# Patient Record
Sex: Male | Born: 1950 | Race: White | Hispanic: No | Marital: Single | Smoking: Former smoker
Health system: Southern US, Community
[De-identification: ages and names within clinical notes are randomized; demographics above are authoritative.]

## PROBLEM LIST (undated history)

## (undated) DIAGNOSIS — N319 Neuromuscular dysfunction of bladder, unspecified: Secondary | ICD-10-CM

## (undated) DIAGNOSIS — R42 Dizziness and giddiness: Secondary | ICD-10-CM

## (undated) DIAGNOSIS — N200 Calculus of kidney: Secondary | ICD-10-CM

## (undated) DIAGNOSIS — C801 Malignant (primary) neoplasm, unspecified: Secondary | ICD-10-CM

## (undated) DIAGNOSIS — Z5189 Encounter for other specified aftercare: Secondary | ICD-10-CM

## (undated) DIAGNOSIS — I10 Essential (primary) hypertension: Secondary | ICD-10-CM

## (undated) DIAGNOSIS — I2699 Other pulmonary embolism without acute cor pulmonale: Secondary | ICD-10-CM

## (undated) DIAGNOSIS — J449 Chronic obstructive pulmonary disease, unspecified: Secondary | ICD-10-CM

## (undated) HISTORY — PX: LITHOTRIPSY: SUR834

## (undated) HISTORY — PX: DUPUYTREN CONTRACTURE RELEASE: SHX1478

## (undated) HISTORY — PX: LUNG CANCER SURGERY: SHX702

## (undated) HISTORY — PX: FRACTURE SURGERY: SHX138

## (undated) HISTORY — PX: OTHER SURGICAL HISTORY: SHX169

---

## 2013-04-01 DIAGNOSIS — R339 Retention of urine, unspecified: Secondary | ICD-10-CM | POA: Diagnosis present

## 2015-02-11 DIAGNOSIS — I10 Essential (primary) hypertension: Secondary | ICD-10-CM | POA: Diagnosis present

## 2015-02-11 HISTORY — DX: Essential (primary) hypertension: I10

## 2015-04-10 DIAGNOSIS — J449 Chronic obstructive pulmonary disease, unspecified: Secondary | ICD-10-CM | POA: Diagnosis present

## 2015-05-10 DIAGNOSIS — T83511A Infection and inflammatory reaction due to indwelling urethral catheter, initial encounter: Secondary | ICD-10-CM

## 2015-05-10 DIAGNOSIS — N390 Urinary tract infection, site not specified: Secondary | ICD-10-CM | POA: Diagnosis present

## 2017-08-04 DIAGNOSIS — E876 Hypokalemia: Secondary | ICD-10-CM | POA: Diagnosis present

## 2018-10-11 DIAGNOSIS — I2699 Other pulmonary embolism without acute cor pulmonale: Secondary | ICD-10-CM | POA: Diagnosis present

## 2018-10-11 DIAGNOSIS — G8929 Other chronic pain: Secondary | ICD-10-CM | POA: Diagnosis present

## 2018-11-29 ENCOUNTER — Emergency Department (HOSPITAL_COMMUNITY): Payer: Medicare Other

## 2018-11-29 ENCOUNTER — Encounter (HOSPITAL_COMMUNITY): Payer: Self-pay

## 2018-11-29 ENCOUNTER — Other Ambulatory Visit: Payer: Self-pay

## 2018-11-29 ENCOUNTER — Observation Stay (HOSPITAL_COMMUNITY)
Admission: EM | Admit: 2018-11-29 | Discharge: 2018-11-30 | Disposition: A | Discharge status: Home / Self Care | Payer: Medicare Other | Attending: Family Medicine | Admitting: Family Medicine

## 2018-11-29 DIAGNOSIS — R339 Retention of urine, unspecified: Secondary | ICD-10-CM | POA: Diagnosis not present

## 2018-11-29 DIAGNOSIS — E876 Hypokalemia: Secondary | ICD-10-CM | POA: Insufficient documentation

## 2018-11-29 DIAGNOSIS — J449 Chronic obstructive pulmonary disease, unspecified: Secondary | ICD-10-CM | POA: Diagnosis present

## 2018-11-29 DIAGNOSIS — I10 Essential (primary) hypertension: Secondary | ICD-10-CM | POA: Insufficient documentation

## 2018-11-29 DIAGNOSIS — A419 Sepsis, unspecified organism: Secondary | ICD-10-CM | POA: Diagnosis not present

## 2018-11-29 DIAGNOSIS — N3001 Acute cystitis with hematuria: Secondary | ICD-10-CM

## 2018-11-29 DIAGNOSIS — Z7901 Long term (current) use of anticoagulants: Secondary | ICD-10-CM | POA: Insufficient documentation

## 2018-11-29 DIAGNOSIS — I2699 Other pulmonary embolism without acute cor pulmonale: Secondary | ICD-10-CM | POA: Diagnosis not present

## 2018-11-29 DIAGNOSIS — R109 Unspecified abdominal pain: Secondary | ICD-10-CM | POA: Diagnosis present

## 2018-11-29 DIAGNOSIS — Z79899 Other long term (current) drug therapy: Secondary | ICD-10-CM | POA: Diagnosis not present

## 2018-11-29 DIAGNOSIS — G8929 Other chronic pain: Secondary | ICD-10-CM | POA: Diagnosis not present

## 2018-11-29 DIAGNOSIS — T83511A Infection and inflammatory reaction due to indwelling urethral catheter, initial encounter: Secondary | ICD-10-CM

## 2018-11-29 DIAGNOSIS — J189 Pneumonia, unspecified organism: Secondary | ICD-10-CM

## 2018-11-29 DIAGNOSIS — R0902 Hypoxemia: Secondary | ICD-10-CM

## 2018-11-29 DIAGNOSIS — R509 Fever, unspecified: Secondary | ICD-10-CM | POA: Diagnosis present

## 2018-11-29 DIAGNOSIS — N390 Urinary tract infection, site not specified: Secondary | ICD-10-CM | POA: Diagnosis not present

## 2018-11-29 DIAGNOSIS — R112 Nausea with vomiting, unspecified: Secondary | ICD-10-CM | POA: Diagnosis present

## 2018-11-29 HISTORY — DX: Encounter for other specified aftercare: Z51.89

## 2018-11-29 HISTORY — DX: Other pulmonary embolism without acute cor pulmonale: I26.99

## 2018-11-29 HISTORY — DX: Chronic obstructive pulmonary disease, unspecified: J44.9

## 2018-11-29 HISTORY — DX: Dizziness and giddiness: R42

## 2018-11-29 HISTORY — DX: Neuromuscular dysfunction of bladder, unspecified: N31.9

## 2018-11-29 HISTORY — DX: Calculus of kidney: N20.0

## 2018-11-29 HISTORY — DX: Essential (primary) hypertension: I10

## 2018-11-29 HISTORY — DX: Malignant (primary) neoplasm, unspecified: C80.1

## 2018-11-29 LAB — INFLUENZA PANEL BY PCR (TYPE A & B)
INFLAPCR: NEGATIVE
Influenza B By PCR: NEGATIVE

## 2018-11-29 LAB — URINALYSIS, ROUTINE W REFLEX MICROSCOPIC
Bilirubin Urine: NEGATIVE
Glucose, UA: NEGATIVE mg/dL
KETONES UR: NEGATIVE mg/dL
Nitrite: POSITIVE — AB
Protein, ur: NEGATIVE mg/dL
Specific Gravity, Urine: 1.02 (ref 1.005–1.030)
pH: 5 (ref 5.0–8.0)

## 2018-11-29 LAB — CBC WITH DIFFERENTIAL/PLATELET
Abs Immature Granulocytes: 0.14 10*3/uL — ABNORMAL HIGH (ref 0.00–0.07)
Basophils Absolute: 0 10*3/uL (ref 0.0–0.1)
Basophils Relative: 0 %
Eosinophils Absolute: 0.1 10*3/uL (ref 0.0–0.5)
Eosinophils Relative: 0 %
HEMATOCRIT: 42.1 % (ref 39.0–52.0)
Hemoglobin: 14 g/dL (ref 13.0–17.0)
Immature Granulocytes: 1 %
Lymphocytes Relative: 6 %
Lymphs Abs: 1 10*3/uL (ref 0.7–4.0)
MCH: 30 pg (ref 26.0–34.0)
MCHC: 33.3 g/dL (ref 30.0–36.0)
MCV: 90.1 fL (ref 80.0–100.0)
MONOS PCT: 5 %
Monocytes Absolute: 0.9 10*3/uL (ref 0.1–1.0)
Neutro Abs: 15.1 10*3/uL — ABNORMAL HIGH (ref 1.7–7.7)
Neutrophils Relative %: 88 %
Platelets: 289 10*3/uL (ref 150–400)
RBC: 4.67 MIL/uL (ref 4.22–5.81)
RDW: 13 % (ref 11.5–15.5)
WBC: 17.2 10*3/uL — ABNORMAL HIGH (ref 4.0–10.5)
nRBC: 0 % (ref 0.0–0.2)

## 2018-11-29 LAB — COMPREHENSIVE METABOLIC PANEL
ALT: 21 U/L (ref 0–44)
AST: 17 U/L (ref 15–41)
Albumin: 3.7 g/dL (ref 3.5–5.0)
Alkaline Phosphatase: 98 U/L (ref 38–126)
Anion gap: 14 (ref 5–15)
BUN: 15 mg/dL (ref 8–23)
CHLORIDE: 102 mmol/L (ref 98–111)
CO2: 25 mmol/L (ref 22–32)
Calcium: 9 mg/dL (ref 8.9–10.3)
Creatinine, Ser: 0.87 mg/dL (ref 0.61–1.24)
GFR calc Af Amer: >60 mL/min (ref >60)
GFR calc non Af Amer: >60 mL/min (ref >60)
Glucose, Bld: 143 mg/dL — ABNORMAL HIGH (ref 70–99)
Potassium: 2.5 mmol/L — CL (ref 3.5–5.1)
Sodium: 141 mmol/L (ref 135–145)
Total Bilirubin: 0.7 mg/dL (ref 0.3–1.2)
Total Protein: 7.6 g/dL (ref 6.5–8.1)

## 2018-11-29 LAB — MAGNESIUM: Magnesium: 2 mg/dL (ref 1.7–2.4)

## 2018-11-29 LAB — I-STAT CG4 LACTIC ACID, ED: Lactic Acid, Venous: 1.62 mmol/L (ref 0.5–1.9)

## 2018-11-29 LAB — HIV ANTIBODY (ROUTINE TESTING W REFLEX): HIV Screen 4th Generation wRfx: NONREACTIVE

## 2018-11-29 LAB — LIPASE, BLOOD: LIPASE: 31 U/L (ref 11–51)

## 2018-11-29 MED ORDER — POTASSIUM CHLORIDE 10 MEQ/100ML IV SOLN
10.0000 meq | Freq: Once | INTRAVENOUS | Status: AC
  Administered 2018-11-29: 10 meq via INTRAVENOUS
  Filled 2018-11-29: qty 100

## 2018-11-29 MED ORDER — FENTANYL CITRATE (PF) 100 MCG/2ML IJ SOLN
50.0000 ug | Freq: Once | INTRAMUSCULAR | Status: AC
  Administered 2018-11-29: 50 ug via INTRAVENOUS
  Filled 2018-11-29: qty 2

## 2018-11-29 MED ORDER — POTASSIUM CHLORIDE CRYS ER 20 MEQ PO TBCR
40.0000 meq | EXTENDED_RELEASE_TABLET | Freq: Two times a day (BID) | ORAL | Status: AC
  Administered 2018-11-29 – 2018-11-30 (×3): 40 meq via ORAL
  Filled 2018-11-29 (×3): qty 2

## 2018-11-29 MED ORDER — SENNOSIDES-DOCUSATE SODIUM 8.6-50 MG PO TABS
1.0000 | ORAL_TABLET | Freq: Every day | ORAL | Status: DC
  Administered 2018-11-29 – 2018-11-30 (×2): 1 via ORAL
  Filled 2018-11-29 (×2): qty 1

## 2018-11-29 MED ORDER — MOMETASONE FURO-FORMOTEROL FUM 200-5 MCG/ACT IN AERO
2.0000 | INHALATION_SPRAY | Freq: Two times a day (BID) | RESPIRATORY_TRACT | Status: DC
  Administered 2018-11-29 – 2018-11-30 (×3): 2 via RESPIRATORY_TRACT
  Filled 2018-11-29: qty 8.8

## 2018-11-29 MED ORDER — HYDROMORPHONE HCL 2 MG PO TABS
4.0000 mg | ORAL_TABLET | ORAL | Status: DC | PRN
  Administered 2018-11-29 – 2018-11-30 (×5): 4 mg via ORAL
  Filled 2018-11-29 (×5): qty 2

## 2018-11-29 MED ORDER — SODIUM CHLORIDE 0.9 % IV SOLN
INTRAVENOUS | Status: AC
  Administered 2018-11-29: 11:00:00 via INTRAVENOUS

## 2018-11-29 MED ORDER — METRONIDAZOLE IN NACL 5-0.79 MG/ML-% IV SOLN
500.0000 mg | Freq: Three times a day (TID) | INTRAVENOUS | Status: DC
  Administered 2018-11-29: 500 mg via INTRAVENOUS
  Filled 2018-11-29: qty 100

## 2018-11-29 MED ORDER — MELATONIN 5 MG PO TABS
5.0000 mg | ORAL_TABLET | Freq: Every evening | ORAL | Status: DC | PRN
  Administered 2018-11-29: 5 mg via ORAL
  Filled 2018-11-29 (×2): qty 1

## 2018-11-29 MED ORDER — VANCOMYCIN HCL IN DEXTROSE 1-5 GM/200ML-% IV SOLN: 1000.0000 mg | Freq: Once | INTRAVENOUS | Status: DC

## 2018-11-29 MED ORDER — SODIUM CHLORIDE (PF) 0.9 % IJ SOLN
INTRAMUSCULAR | Status: AC
  Filled 2018-11-29: qty 50

## 2018-11-29 MED ORDER — POTASSIUM CHLORIDE CRYS ER 20 MEQ PO TBCR
40.0000 meq | EXTENDED_RELEASE_TABLET | Freq: Once | ORAL | Status: AC
  Administered 2018-11-29: 40 meq via ORAL
  Filled 2018-11-29: qty 2

## 2018-11-29 MED ORDER — IOPAMIDOL (ISOVUE-300) INJECTION 61%
100.0000 mL | Freq: Once | INTRAVENOUS | Status: AC | PRN
  Administered 2018-11-29: 100 mL via INTRAVENOUS

## 2018-11-29 MED ORDER — POTASSIUM CHLORIDE 10 MEQ/100ML IV SOLN
10.0000 meq | INTRAVENOUS | Status: AC
  Administered 2018-11-29: 10 meq via INTRAVENOUS
  Filled 2018-11-29: qty 100

## 2018-11-29 MED ORDER — VANCOMYCIN HCL 10 G IV SOLR
1500.0000 mg | Freq: Once | INTRAVENOUS | Status: AC
  Administered 2018-11-29: 1500 mg via INTRAVENOUS
  Filled 2018-11-29: qty 1500

## 2018-11-29 MED ORDER — IBUPROFEN 200 MG PO TABS
400.0000 mg | ORAL_TABLET | Freq: Four times a day (QID) | ORAL | Status: DC | PRN
  Administered 2018-11-29: 400 mg via ORAL
  Filled 2018-11-29: qty 2

## 2018-11-29 MED ORDER — ALBUTEROL SULFATE (2.5 MG/3ML) 0.083% IN NEBU: 2.5000 mg | INHALATION_SOLUTION | RESPIRATORY_TRACT | Status: DC | PRN

## 2018-11-29 MED ORDER — ATORVASTATIN CALCIUM 40 MG PO TABS
40.0000 mg | ORAL_TABLET | Freq: Every day | ORAL | Status: DC
  Administered 2018-11-29 – 2018-11-30 (×2): 40 mg via ORAL
  Filled 2018-11-29 (×2): qty 1

## 2018-11-29 MED ORDER — IOPAMIDOL (ISOVUE-300) INJECTION 61%
INTRAVENOUS | Status: AC
  Filled 2018-11-29: qty 100

## 2018-11-29 MED ORDER — PANTOPRAZOLE SODIUM 40 MG PO TBEC
40.0000 mg | DELAYED_RELEASE_TABLET | Freq: Every day | ORAL | Status: DC
  Administered 2018-11-29 – 2018-11-30 (×2): 40 mg via ORAL
  Filled 2018-11-29 (×2): qty 1

## 2018-11-29 MED ORDER — SODIUM CHLORIDE 0.9 % IV SOLN
2.0000 g | Freq: Once | INTRAVENOUS | Status: AC
  Administered 2018-11-29: 2 g via INTRAVENOUS
  Filled 2018-11-29: qty 2

## 2018-11-29 MED ORDER — PREGABALIN 50 MG PO CAPS
50.0000 mg | ORAL_CAPSULE | Freq: Two times a day (BID) | ORAL | Status: DC
  Administered 2018-11-29 – 2018-11-30 (×3): 50 mg via ORAL
  Filled 2018-11-29 (×3): qty 1

## 2018-11-29 MED ORDER — ONDANSETRON HCL 4 MG PO TABS: 4.0000 mg | ORAL_TABLET | Freq: Four times a day (QID) | ORAL | Status: DC | PRN

## 2018-11-29 MED ORDER — SODIUM CHLORIDE 0.9 % IV BOLUS (SEPSIS)
1000.0000 mL | Freq: Once | INTRAVENOUS | Status: AC
  Administered 2018-11-29: 1000 mL via INTRAVENOUS

## 2018-11-29 MED ORDER — ONDANSETRON HCL 4 MG/2ML IJ SOLN
4.0000 mg | Freq: Once | INTRAMUSCULAR | Status: AC
  Administered 2018-11-29: 4 mg via INTRAVENOUS
  Filled 2018-11-29: qty 2

## 2018-11-29 MED ORDER — APIXABAN 5 MG PO TABS
5.0000 mg | ORAL_TABLET | Freq: Two times a day (BID) | ORAL | Status: DC
  Administered 2018-11-29 – 2018-11-30 (×3): 5 mg via ORAL
  Filled 2018-11-29 (×3): qty 1

## 2018-11-29 MED ORDER — FENTANYL CITRATE (PF) 100 MCG/2ML IJ SOLN
100.0000 ug | Freq: Once | INTRAMUSCULAR | Status: AC
  Administered 2018-11-29: 100 ug via INTRAVENOUS
  Filled 2018-11-29: qty 2

## 2018-11-29 MED ORDER — POLYETHYLENE GLYCOL 3350 17 G PO PACK: 17.0000 g | PACK | Freq: Every day | ORAL | Status: DC | PRN

## 2018-11-29 MED ORDER — ONDANSETRON HCL 4 MG/2ML IJ SOLN: 4.0000 mg | Freq: Four times a day (QID) | INTRAMUSCULAR | Status: DC | PRN

## 2018-11-29 MED ORDER — SODIUM CHLORIDE 0.9 % IV SOLN
1.0000 g | Freq: Three times a day (TID) | INTRAVENOUS | Status: DC
  Administered 2018-11-29 – 2018-11-30 (×4): 1 g via INTRAVENOUS
  Filled 2018-11-29 (×4): qty 1

## 2018-11-29 MED ORDER — IBUPROFEN 800 MG PO TABS
800.0000 mg | ORAL_TABLET | Freq: Once | ORAL | Status: AC
  Administered 2018-11-29: 800 mg via ORAL
  Filled 2018-11-29: qty 1

## 2018-11-29 NOTE — ED Notes (Signed)
Patient declined staff to cath him-patient cathed himself

## 2018-11-29 NOTE — H&P (Addendum)
History and Physical  Patient Name: Kurt Becker     KZL:[id number]    DOB: 1950-12-10    DOA: 11/29/2018 PCP: Patient, No Pcp Per  [facility] (lives in [place])  Chief Complaint: Abdominal pain, urgency, fever, vomiting      HPI: Kurt Becker is a 68 y.o. M with hx neurogenic bladder, intermittent self-caths, hx of ESBL Klebsiella UTI, recent PE Nov 2019, recent lung wedge resection for benigh nodule, HTN, COPD not on home O2, and chronic pain on Dilaudid who presents with fever, abdominal pain, vomiting, and urinary urgency for 2-3 days.  The patient was in his usual state of health until about 2 days ago when he started to feel generally bad.  Then yesterday evening he started to develop abdominal pain, the need to urinate but no urine with cath, then nausea and vomiting, and overnight he could not sleep.  Then this morning he had fever so he came to the ER.  ED course: -Temp 102.61F, heart rate 126, respirations 23, blood pressure 112/86, pulse ox 88% on room air -Na 141, K 2.5, Cr 0.8, WBC 17K, Hgb 14 -lipase normal -Lactate 1.6 -Influenza negative -UA with bacteria and pyuria -CXR showed some linear opacities bilateral lower lobes -CT abdomen showed perinephric stranding, post-operative changes in right lower lobe, no stones -ECG showed sinus tachycardia - He was given 60 mEq of potassium, 2 L IV fluids, as well as vancomycin, cefepime, and Flagyl, and felt clinical improvement -The hospitalist group were asked to evaluate for sepsis     ROS: Review of Systems  Constitutional: Positive for fever and malaise/fatigue.  Respiratory: Positive for wheezing. Negative for cough, sputum production and shortness of breath.   Cardiovascular: Negative for chest pain, palpitations, orthopnea and leg swelling.  Gastrointestinal: Positive for abdominal pain, nausea and vomiting.  Genitourinary: Positive for urgency. Negative for dysuria, frequency and hematuria.    Neurological: Negative for loss of consciousness.  All other systems reviewed and are negative.         Past Medical History:  Diagnosis Date  . COPD (chronic obstructive pulmonary disease) ([place])   . Essential hypertension 02/11/2015  . Kidney stone   . Neurogenic bladder   . Pulmonary embolus [facility])     Past Surgical History:  Procedure Laterality Date  . c-spine fusion    . DUPUYTREN CONTRACTURE RELEASE    . FRACTURE SURGERY    . LITHOTRIPSY    . LUNG CANCER SURGERY      Social History: Patient lives alone in [place].  The patient walks unassisted, he stil drives.  Former heavy smoker, now quit   Allergies  Allergen Reactions  . Hydrocodone     "bad for my liver"  . Percocet [Oxycodone-Acetaminophen]     "makes me whacked out"    Family history: family history includes Dementia in his mother; Pneumonia in his father.  Prior to Admission medications   Medication Sig Start Date End Date Taking? Authorizing Provider  albuterol (PROVENTIL HFA;VENTOLIN HFA) 108 (90 Base) MCG/ACT inhaler Inhale 1-2 puffs into the lungs every 6 (six) hours as needed for wheezing or shortness of breath.   Yes [provider]  apixaban (ELIQUIS) 5 MG TABS tablet Take 5 mg by mouth 2 (two) times daily.   Yes [provider]  Ascorbic Acid (VITAMIN C) 1000 MG tablet Take 1,000 mg by mouth daily.   Yes [provider]  atorvastatin (LIPITOR) 40 MG tablet Take 40 mg by mouth  daily.   Yes [provider]  budesonide-formoterol (SYMBICORT) 160-4.5 MCG/ACT inhaler Inhale 2 puffs into the lungs 2 (two) times daily.   Yes [provider]  hydrochlorothiazide (HYDRODIURIL) 25 MG tablet Take 25 mg by mouth daily.   Yes [provider]  HYDROmorphone (DILAUDID) 4 MG tablet Take 4 mg by mouth every 3 (three) hours as needed for severe pain.   Yes [provider]  meclizine (ANTIVERT) 25 MG tablet Take 25 mg by mouth 3 (three) times daily.    Yes [provider]  Melatonin 5 MG TABS Take 5 mg by mouth at bedtime as needed (sleep).   Yes [provider]  pantoprazole (PROTONIX) 40 MG tablet Take 40 mg by mouth daily.   Yes [provider]  pregabalin (LYRICA) 50 MG capsule Take 50 mg by mouth 2 (two) times daily.   Yes [provider]  sennosides-docusate sodium (SENOKOT-S) 8.6-50 MG tablet Take 1 tablet by mouth daily.   Yes [provider]       Physical Exam: BP 109/80   Pulse 87   Temp (!) 102.8 F (39.3 C) (Rectal)   Resp 20   Ht 5\' 11"  (1.803 m)   Wt 97.2 kg   SpO2 96%   BMI 29.90 kg/m  General appearance: Well-developed, adult male, alert and in no acute distress.   Eyes: Anicteric, conjunctiva pink, lids and lashes normal. PERRL.    ENT: No nasal deformity, discharge, epistaxis.  Hearing normal. OP moist without lesions.  Denstures in place, lips normal. Neck: No neck masses.  Trachea midline.  No thyromegaly/tenderness. Lymph: No cervical or supraclavicular lymphadenopathy. Skin: Warm and dry.  No jaundice.  No suspicious rashes or lesions. Cardiac: RRR, nl S1-S2, no murmurs appreciated.  Capillary refill is brisk.  JVP normal.  No LE edema.  Radial and DP pulses 2+ and symmetric. Respiratory: Somewhat dyspneic, audible wheezing, rhonchi on auscultation, no rales.   Abdomen: Abdomen soft.  Marked RUQ TTP, voluntary guarding noted, no rebound. No ascites, distension.   MSK: No deformities or effusions.  No cyanosis or clubbing.  Missing right 1st digit. Neuro: Cranial nerves normal.  Sensation intact to light touch. Speech is fluent.  Muscle strength normal and symmetric.    Psych: Sensorium intact and responding to questions, attention normal.  Behavior appropriate.  Affect normal.  Judgment and insight appear normal.     Labs on Admission:  I have personally reviewed following labs and imaging studies: CBC: Recent Labs  Lab 11/29/18 0334  WBC 17.2*  NEUTROABS  15.1*  HGB 14.0  HCT 42.1  MCV 90.1  PLT 409   Basic Metabolic Panel: Recent Labs  Lab 11/29/18 0334  NA 141  K 2.5*  CL 102  CO2 25  GLUCOSE 143*  BUN 15  CREATININE 0.87  CALCIUM 9.0   GFR: Estimated Creatinine Clearance: 100.7 mL/min (by C-G formula based on SCr of 0.87 mg/dL).  Liver Function Tests: Recent Labs  Lab 11/29/18 0334  AST 17  ALT 21  ALKPHOS 98  BILITOT 0.7  PROT 7.6  ALBUMIN 3.7   Recent Labs  Lab 11/29/18 0334  LIPASE 31   Sepsis Labs: Lactic acid 1.6           Radiological Exams on Admission: Personally reviewed CXR shows patchy bilateral opacities; CT report reviewed and summarized above: Dg Chest 2 View  Result Date: 11/29/2018 CLINICAL DATA:  Acute onset of cough, fever and shortness of breath. EXAM: CHEST -  2 VIEW COMPARISON:  None. FINDINGS: The lungs are well-aerated. Mild right midlung and medial left basilar airspace opacities raise concern for pneumonia. There is no evidence of pleural effusion or pneumothorax. The heart is normal in size; the mediastinal contour is within normal limits. No acute osseous abnormalities are seen. Cervical spinal fusion hardware is partially imaged. IMPRESSION: Mild right midlung and medial left basilar airspace opacities raise concern for pneumonia. Followup PA and lateral chest X-ray is recommended in 3-4 weeks following trial of antibiotic therapy to ensure resolution and exclude underlying malignancy. Electronically Signed   By: Garald Balding M.D.   On: 11/29/2018 04:17   Ct Abdomen Pelvis W Contrast  Result Date: 11/29/2018 CLINICAL DATA:  Acute onset of generalized abdominal pain and nausea. Right upper quadrant distention. Leukocytosis. EXAM: CT ABDOMEN AND PELVIS WITH CONTRAST TECHNIQUE: Multidetector CT imaging of the abdomen and pelvis was performed using the standard protocol following bolus administration of intravenous contrast. CONTRAST:  144mL ISOVUE-300 IOPAMIDOL (ISOVUE-300) INJECTION 61%  COMPARISON:  None. FINDINGS: Lower chest: Postoperative change is noted at the right lung base. Underlying hazy airspace opacity is thought to reflect post treatment change, though pneumonia could have a similar appearance. A mildly loculated trace right pleural effusion is noted. The visualized portions of the mediastinum are unremarkable. Hepatobiliary: The liver is unremarkable in appearance. The gallbladder is unremarkable in appearance. The common bile duct remains normal in caliber. Pancreas: The pancreas is within normal limits. Spleen: The spleen is unremarkable in appearance. Adrenals/Urinary Tract: The adrenal glands are unremarkable in appearance. Nonspecific perinephric stranding is noted bilaterally. There is no evidence of hydronephrosis. No renal or ureteral stones are identified. Stomach/Bowel: The stomach is unremarkable in appearance. The small bowel is within normal limits. The appendix is normal in caliber, without evidence of appendicitis. Scattered diverticulosis is noted along the descending and sigmoid colon, without evidence of diverticulitis. Vascular/Lymphatic: Scattered calcification is seen along the abdominal aorta and its branches. The abdominal aorta is otherwise grossly unremarkable. The inferior vena cava is grossly unremarkable. No retroperitoneal lymphadenopathy is seen. No pelvic sidewall lymphadenopathy is identified. Reproductive: The bladder is relatively decompressed and not well characterized. The prostate is borderline normal in size. Other: A small left inguinal hernia is noted, containing only fat. Musculoskeletal: No acute osseous abnormalities are identified. The visualized musculature is unremarkable in appearance. IMPRESSION: 1. No acute abnormality seen to explain the patient's symptoms. 2. Postoperative change at the right lung base. Underlying hazy airspace opacity is thought to reflect post treatment change, though pneumonia could have a similar appearance. Mildly  loculated trace right pleural effusion noted. 3. Scattered diverticulosis along the descending and sigmoid colon, without evidence of diverticulitis. 4. Small left inguinal hernia, containing only fat. Aortic Atherosclerosis (ICD10-I70.0). Electronically Signed   By: Garald Balding M.D.   On: 11/29/2018 06:18    EKG: Independently reviewed. Rate 123, sinus rhythm, no ST changes.         Assessment/Plan  Sepsis:  Presents with fever, abdominal pain, urinary urgency.  No real resp symptoms and CXR findings are better explained by post-op change from his wedge resection.    Suspected source urine, ie self-catheterization related UTI. Organism unknown but CareEverywhere shows he has had ESBL Klebsiella in 2016, 2017 and 2018.   Patient meets criteria given tachycardia, tachypnea, fever, leukocytosis.  Lactate 1.6 mmol/L  -Start IV Meropenem, discussed with pharmacy -Repeat renal function and complete blood count in AM -Follow Blood and urine cultures  Hypokalemia:  -Check mag -Supplement K -Repeat BMP  COPD Hypoxia:  He is wheezy, but I suspect his initial hypoxia was transient in context of pain, tachycardia, not that he is in COPD flare per se. -Continue home Symbicort as formulary -Albuterol PRN -If unable to wean O2, will start prednisone burst  Chronic pain:  -Continue home Dilaudid and Lyrica  Pulmonary embolism:  This was in Nov 2019 after his wedge resection -Continue home Eliquis  Hypertension:  Bp normal -Hold HCTZ until hemodynamics clearer -Continue atorvastatin  Other medications:  -Continue PPI, senna     DVT prophylaxis: N/A on Eliquis  Code Status: FULL  Family Communication: Friend at bedside  Disposition Plan: Anticipate IV fluids, empiric antibioitcs.  Follow culture data.  If culture data not available by tomororw morning, may need extra day for sensitivities to decide on PICC, etc. Consults called: None Admission status: OBS At the  point of initial evaluation, it is my clinical opinion that admission for OBSERVATION is reasonable and necessary because the patient's presenting complaints in the context of their chronic conditions represent sufficient risk of deterioration or significant morbidity to constitute reasonable grounds for close observation in the hospital setting, but that the patient may be medically stable for discharge from the hospital within 24 to 48 hours.    Medical decision making: Patient seen at 7:54 AM on 11/29/2018.  Outside records obtained and summarized above.  What exists of the patient's chart was reviewed in depth and summarized above.  Clinical condition: stable, HR improved.        [facility] Please page through [employer]

## 2018-11-29 NOTE — ED Triage Notes (Addendum)
Pt arrives from home by [facility] due to abdominal pain, nausea, and fever x 3 days. Per EMS, distention noted to RUQ. Pt states he has had a fever at home and feels warm to the touch. Pt is from [place] visiting.

## 2018-11-29 NOTE — ED Notes (Signed)
ED TO INPATIENT HANDOFF REPORT  Name/Age/Gender Kurt Becker 68 y.o. male  Code Status   Home/SNF/Other Home  Chief Complaint Abdominal pain and Fever  Level of Care/Admitting Diagnosis ED Disposition    ED Disposition Condition Comment   Admit  Hospital Area: [facility] [[id number]]  Level of Care: Telemetry [5]  Admit to tele based on following criteria: Other see comments  Comments: severe hypokalemia  Diagnosis: Sepsis [facility]) [[id number]]  Admitting Physician: Vianne Bulls [[id number]]  Attending Physician: Vianne Bulls [[id number]]  PT Class (Do Not Modify): Observation [104]  PT Acc Code (Do Not Modify): Observation [10022]       Medical History Past Medical History:  Diagnosis Date  . Blood transfusion without reported diagnosis   . Cancer ([place])   . COPD (chronic obstructive pulmonary disease) ([place])   . Dizziness   . Kidney stone   . Neurogenic bladder   . Pulmonary embolus ([facility])     Allergies Allergies  Allergen Reactions  . Hydrocodone     "bad for my liver"  . Percocet [Oxycodone-Acetaminophen]     "makes me whacked out"    IV Location/Drains/Wounds Patient Lines/Drains/Airways Status   Active Line/Drains/Airways    Name:   Placement date:   Placement time:   Site:   Days:   Peripheral IV 11/29/18 Forearm   11/29/18    0242    Forearm   less than 1   Peripheral IV 11/29/18 Right;Anterior Forearm   11/29/18    0347    Forearm   less than 1          Labs/Imaging Results for orders placed or performed during the hospital encounter of 11/29/18 (from the past 48 hour(s))  Lipase, blood     Status: None   Collection Time: 11/29/18  3:34 AM  Result Value Ref Range   Lipase 31 11 - 51 U/L    Comment: Performed at [facility], [place] [address], [place], [place] [address]  Comprehensive metabolic panel     Status: Abnormal   Collection Time: 11/29/18  3:34 AM  Result Value Ref Range   Sodium 141 135 - 145 mmol/L    Potassium 2.5 (LL) 3.5 - 5.1 mmol/L    Comment: CRITICAL RESULT CALLED TO, READ BACK BY AND VERIFIED WITH: FRICKEY,JON RN AT 3086 11/29/18 BY TIBBITTS,KELLY    Chloride 102 98 - 111 mmol/L   CO2 25 22 - 32 mmol/L   Glucose, Bld 143 (H) 70 - 99 mg/dL   BUN 15 8 - 23 mg/dL   Creatinine, Ser 0.87 0.61 - 1.24 mg/dL   Calcium 9.0 8.9 - 10.3 mg/dL   Total Protein 7.6 6.5 - 8.1 g/dL   Albumin 3.7 3.5 - 5.0 g/dL   AST 17 15 - 41 U/L   ALT 21 0 - 44 U/L   Alkaline Phosphatase 98 38 - 126 U/L   Total Bilirubin 0.7 0.3 - 1.2 mg/dL   GFR calc non Af Amer >60 >60 mL/min   GFR calc Af Amer >60 >60 mL/min   Anion gap 14 5 - 15    Comment: Performed at [facility], [place] [address], [place], [place] [address]  CBC WITH DIFFERENTIAL     Status: Abnormal   Collection Time: 11/29/18  3:34 AM  Result Value Ref Range   WBC 17.2 (H) 4.0 - 10.5 K/uL   RBC 4.67 4.22 - 5.81 MIL/uL   Hemoglobin 14.0 13.0 - 17.0  g/dL   HCT 42.1 39.0 - 52.0 %   MCV 90.1 80.0 - 100.0 fL   MCH 30.0 26.0 - 34.0 pg   MCHC 33.3 30.0 - 36.0 g/dL   RDW 13.0 11.5 - 15.5 %   Platelets 289 150 - 400 K/uL   nRBC 0.0 0.0 - 0.2 %   Neutrophils Relative % 88 %   Neutro Abs 15.1 (H) 1.7 - 7.7 K/uL   Lymphocytes Relative 6 %   Lymphs Abs 1.0 0.7 - 4.0 K/uL   Monocytes Relative 5 %   Monocytes Absolute 0.9 0.1 - 1.0 K/uL   Eosinophils Relative 0 %   Eosinophils Absolute 0.1 0.0 - 0.5 K/uL   Basophils Relative 0 %   Basophils Absolute 0.0 0.0 - 0.1 K/uL   Immature Granulocytes 1 %   Abs Immature Granulocytes 0.14 (H) 0.00 - 0.07 K/uL    Comment: Performed at [facility], [place] [address] [place], [place] [address]  I-Stat CG4 Lactic Acid, ED     Status: None   Collection Time: 11/29/18  3:48 AM  Result Value Ref Range   Lactic Acid, Venous 1.62 0.5 - 1.9 mmol/L  Influenza panel by PCR (type A & B)     Status: None   Collection Time: 11/29/18  3:52 AM  Result Value Ref Range   Influenza  A By PCR NEGATIVE NEGATIVE   Influenza B By PCR NEGATIVE NEGATIVE    Comment: (NOTE) The Xpert Xpress Flu assay is intended as an aid in the diagnosis of  influenza and should not be used as a sole basis for treatment.  This  assay is FDA approved for nasopharyngeal swab specimens only. Nasal  washings and aspirates are unacceptable for Xpert Xpress Flu testing. Performed at [facility], [place] [address] [place], [place] [address]   Urinalysis, Routine w reflex microscopic     Status: Abnormal   Collection Time: 11/29/18  5:25 AM  Result Value Ref Range   Color, Urine YELLOW YELLOW   APPearance CLEAR CLEAR   Specific Gravity, Urine 1.020 1.005 - 1.030   pH 5.0 5.0 - 8.0   Glucose, UA NEGATIVE NEGATIVE mg/dL   Hgb urine dipstick LARGE (A) NEGATIVE   Bilirubin Urine NEGATIVE NEGATIVE   Ketones, ur NEGATIVE NEGATIVE mg/dL   Protein, ur NEGATIVE NEGATIVE mg/dL   Nitrite POSITIVE (A) NEGATIVE   Leukocytes, UA SMALL (A) NEGATIVE   RBC / HPF 21-50 0 - 5 RBC/hpf   WBC, UA 21-50 0 - 5 WBC/hpf   Bacteria, UA MANY (A) NONE SEEN   Squamous Epithelial / LPF 0-5 0 - 5   WBC Clumps PRESENT    Mucus PRESENT     Comment: Performed at [facility], [place] [address] [place], [place] [address]   Dg Chest 2 View  Result Date: 11/29/2018 CLINICAL DATA:  Acute onset of cough, fever and shortness of breath. EXAM: CHEST - 2 VIEW COMPARISON:  None. FINDINGS: The lungs are well-aerated. Mild right midlung and medial left basilar airspace opacities raise concern for pneumonia. There is no evidence of pleural effusion or pneumothorax. The heart is normal in size; the mediastinal contour is within normal limits. No acute osseous abnormalities are seen. Cervical spinal fusion hardware is partially imaged. IMPRESSION: Mild right midlung and medial left basilar airspace opacities raise concern for pneumonia. Followup PA and lateral chest X-ray is recommended in 3-4 weeks  following trial of antibiotic therapy to ensure resolution  and exclude underlying malignancy. Electronically Signed   By: Garald Balding M.D.   On: 11/29/2018 04:17   Ct Abdomen Pelvis W Contrast  Result Date: 11/29/2018 CLINICAL DATA:  Acute onset of generalized abdominal pain and nausea. Right upper quadrant distention. Leukocytosis. EXAM: CT ABDOMEN AND PELVIS WITH CONTRAST TECHNIQUE: Multidetector CT imaging of the abdomen and pelvis was performed using the standard protocol following bolus administration of intravenous contrast. CONTRAST:  18mL ISOVUE-300 IOPAMIDOL (ISOVUE-300) INJECTION 61% COMPARISON:  None. FINDINGS: Lower chest: Postoperative change is noted at the right lung base. Underlying hazy airspace opacity is thought to reflect post treatment change, though pneumonia could have a similar appearance. A mildly loculated trace right pleural effusion is noted. The visualized portions of the mediastinum are unremarkable. Hepatobiliary: The liver is unremarkable in appearance. The gallbladder is unremarkable in appearance. The common bile duct remains normal in caliber. Pancreas: The pancreas is within normal limits. Spleen: The spleen is unremarkable in appearance. Adrenals/Urinary Tract: The adrenal glands are unremarkable in appearance. Nonspecific perinephric stranding is noted bilaterally. There is no evidence of hydronephrosis. No renal or ureteral stones are identified. Stomach/Bowel: The stomach is unremarkable in appearance. The small bowel is within normal limits. The appendix is normal in caliber, without evidence of appendicitis. Scattered diverticulosis is noted along the descending and sigmoid colon, without evidence of diverticulitis. Vascular/Lymphatic: Scattered calcification is seen along the abdominal aorta and its branches. The abdominal aorta is otherwise grossly unremarkable. The inferior vena cava is grossly unremarkable. No retroperitoneal lymphadenopathy is seen. No pelvic  sidewall lymphadenopathy is identified. Reproductive: The bladder is relatively decompressed and not well characterized. The prostate is borderline normal in size. Other: A small left inguinal hernia is noted, containing only fat. Musculoskeletal: No acute osseous abnormalities are identified. The visualized musculature is unremarkable in appearance. IMPRESSION: 1. No acute abnormality seen to explain the patient's symptoms. 2. Postoperative change at the right lung base. Underlying hazy airspace opacity is thought to reflect post treatment change, though pneumonia could have a similar appearance. Mildly loculated trace right pleural effusion noted. 3. Scattered diverticulosis along the descending and sigmoid colon, without evidence of diverticulitis. 4. Small left inguinal hernia, containing only fat. Aortic Atherosclerosis (ICD10-I70.0). Electronically Signed   By: Garald Balding M.D.   On: 11/29/2018 06:18    Pending Labs Unresulted Labs (From admission, onward)    Start     Ordered   11/29/18 0330  Blood Culture (routine x 2)  BLOOD CULTURE X 2,   STAT     11/29/18 0330   11/29/18 0330  Urine culture  ONCE - STAT,   STAT     11/29/18 0330          Vitals/Pain Today's Vitals   11/29/18 0529 11/29/18 0530 11/29/18 0630 11/29/18 0643  BP:  110/70 101/71   Pulse:  (!) 103 93   Resp:  18 (!) 23   Temp:      TempSrc:      SpO2:  92% 91%   Weight:      Height:      PainSc: 8    5     Isolation Precautions Droplet precaution  Medications Medications  metroNIDAZOLE (FLAGYL) IVPB 500 mg (0 mg Intravenous Stopped 11/29/18 0529)  vancomycin (VANCOCIN) 1,500 mg in sodium chloride 0.9 % 500 mL IVPB (1,500 mg Intravenous New Bag/Given 11/29/18 0528)  potassium chloride 10 mEq in 100 mL IVPB (0 mEq Intravenous Stopped 11/29/18 0643)  iopamidol (ISOVUE-300) 61 % injection (  has no administration in time range)  sodium chloride (PF) 0.9 % injection (has no administration in time range)  ceFEPIme  (MAXIPIME) 2 g in sodium chloride 0.9 % 100 mL IVPB (0 g Intravenous Stopped 11/29/18 0450)  sodium chloride 0.9 % bolus 1,000 mL (0 mLs Intravenous Stopped 11/29/18 0529)  fentaNYL (SUBLIMAZE) injection 50 mcg (50 mcg Intravenous Given 11/29/18 0414)  ondansetron (ZOFRAN) injection 4 mg (4 mg Intravenous Given 11/29/18 0413)  ibuprofen (ADVIL,MOTRIN) tablet 800 mg (800 mg Oral Given 11/29/18 0414)  potassium chloride SA (K-DUR,KLOR-CON) CR tablet 40 mEq (40 mEq Oral Given 11/29/18 0533)  sodium chloride 0.9 % bolus 1,000 mL (0 mLs Intravenous Stopped 11/29/18 0644)  fentaNYL (SUBLIMAZE) injection 100 mcg (100 mcg Intravenous Given 11/29/18 0536)  iopamidol (ISOVUE-300) 61 % injection 100 mL (100 mLs Intravenous Contrast Given 11/29/18 0557)    Mobility walks

## 2018-11-29 NOTE — ED Notes (Signed)
Bed: JP21 Expected date:  Expected time:  Means of arrival:  Comments: EMS 68 yo male abdominal pain and fever x 3 days ST 130 IV NS/wheezing and SOB

## 2018-11-29 NOTE — Progress Notes (Signed)
PHARMACY - PHYSICIAN COMMUNICATION CRITICAL VALUE ALERT - BLOOD CULTURE IDENTIFICATION (BCID)  Kurt Becker is an 68 y.o. male who presented to [facility] on 11/29/2018 with a chief complaint of abdominal pain, fever, urinary urgency in patient with neurogenic bladder  Assessment: started on meropenem for possible UTI in patient with previous ESBL in urine cx (care everywhere).  GPR in blood cx which is a likely contaminant.  No BCID performed  Name of physician (or Provider) Contacted: [place]  Current antibiotics: meropenem  Changes to prescribed antibiotics recommended:  Patient is on recommended antibiotics - No changes needed  No results found for this or any previous visit.  Doreene Eland, PharmD, BCPS.   Work Cell: [phone] 11/29/2018 2:03 PM

## 2018-11-29 NOTE — Progress Notes (Signed)
Pharmacy Antibiotic Note  Kurt Becker is a 68 y.o. male presented to the ED on 11/29/2018 with c/o abd pain and fever.  To start meropenem for suspected UTI.  - 1/2 abd CT: no acute abnormality noted - 1/2 CXR: Mild right midlung and medial left basilar airspace opacities raise concern for pneumonia.  - 1/2 UA: many bacteria, small leuk, pos nitrite   Plan: - meropenem 1gm IV q8h  _____________________________________  Height: 5\' 11"  (180.3 cm) Weight: 214 lb 6 oz (97.2 kg) IBW/kg (Calculated) : 75.3  Temp (24hrs), Avg:101.3 F (38.5 C), Min:99.7 F (37.6 C), Max:102.8 F (39.3 C)  Recent Labs  Lab 11/29/18 0334 11/29/18 0348  WBC 17.2*  --   CREATININE 0.87  --   LATICACIDVEN  --  1.62    Estimated Creatinine Clearance: 100.7 mL/min (by C-G formula based on SCr of 0.87 mg/dL).    Allergies  Allergen Reactions  . Hydrocodone     "bad for my liver"  . Percocet [Oxycodone-Acetaminophen]     "makes me whacked out"     Thank you for allowing pharmacy to be a part of this patient's care.  Lynelle Doctor 11/29/2018 8:02 AM

## 2018-11-29 NOTE — ED Notes (Signed)
Jess, RN requested pt to be transported after report around 0730.

## 2018-11-29 NOTE — ED Triage Notes (Signed)
Patient states he is here visiting from [place] and has been around family members who have been sick

## 2018-11-29 NOTE — Progress Notes (Signed)

## 2018-11-29 NOTE — ED Provider Notes (Addendum)
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  He is on Dilaudid for chronic diffuse pain.  No longer takes Tylenol due to his history of "enlarged liver".  Denies cirrhosis.  Also reports he has had a cough.  States he quit smoking several months ago but family states he is still smoking regularly.  Does not wear oxygen chronically.  No diarrhea.  States he is here visiting family for the holidays and was planning to return to [place] later today.  ROS: See HPI Constitutional:  fever  Eyes: no drainage  ENT: no runny nose   Cardiovascular:  no chest pain  Resp:  SOB  GI: vomiting GU: no dysuria Integumentary: no rash  Allergy: no hives  Musculoskeletal: no leg swelling  Neurological: no slurred speech ROS otherwise negative  PAST MEDICAL HISTORY/PAST SURGICAL HISTORY:  Past Medical History:  Diagnosis Date  . Blood transfusion without reported diagnosis   . Cancer ([place])   . COPD (chronic obstructive pulmonary disease) ([place])   . Dizziness   . Kidney stone   . Neurogenic bladder   . Pulmonary embolus ([facility])     MEDICATIONS:  Prior to Admission medications   Not on File    ALLERGIES:  Allergies  Allergen Reactions  . Hydrocodone     "bad for my liver"  . Percocet [Oxycodone-Acetaminophen]     "makes me whacked out"    SOCIAL HISTORY:  Social History   Tobacco Use  . Smoking status: Former Research scientist (life sciences)  . Smokeless tobacco: Never Used  Substance Use Topics  . Alcohol use: Not Currently    FAMILY HISTORY: No family history on file.  EXAM: BP 112/86 (BP Location: Left Arm)   Pulse (!) 126   Temp 99.7 F (37.6 C) (Oral)    Resp (!) 22   Ht 5\' 11"  (1.803 m)   Wt 97.2 kg   SpO2 90%   BMI 29.90 kg/m  CONSTITUTIONAL: Alert and oriented and responds appropriately to questions.  Elderly, appears older than stated age, chronically ill-appearing HEAD: Normocephalic EYES: Conjunctivae clear, pupils appear equal, EOMI ENT: normal nose; moist mucous membranes NECK: Supple, no meningismus, no nuchal rigidity, no LAD  CARD: Regular and tachycardic; S1 and S2 appreciated; no murmurs, no clicks, no rubs, no gallops RESP: Normal chest excursion without splinting, tachypneic, diffuse wheezes and rhonchi on examination, hypoxic on room air, speaking full sentences ABD/GI: Normal bowel sounds; non-distended; soft, patient does have hepatomegaly on exam, he is tender in the right upper quadrant which he reports is chronic but also tender throughout the rest of his abdomen especially in the left lower quadrant BACK:  The back appears normal and is non-tender to palpation, there is no CVA tenderness EXT: Normal ROM in all joints; non-tender to palpation; no edema; normal capillary refill; no cyanosis, no calf tenderness or swelling    SKIN: Normal color for age and race; warm; no rash NEURO: Moves all extremities equally PSYCH: The patient's mood and manner are appropriate. Grooming and personal hygiene are appropriate.  MEDICAL DECISION MAKING: Patient here with fever, tachycardia.  Given he meets sirs criteria, sepsis work-up initiated.  He has a new oxygen requirement today.  I am  concerned for possible flu versus pneumonia.  His lungs sound terrible.  Does have history of PE but reports compliance with Eliquis.  Also complaining of vomiting abdominal pain.  Will obtain CT of abdomen pelvis for further evaluation.  Differential includes colitis, diverticulitis, appendicitis, pyelonephritis, UTI, bowel obstruction, pancreatitis, cholecystitis.  Will give IV fluids and broad-spectrum antibiotics.  Will give pain and nausea medicine.   Will avoid Tylenol given his history of "enlarged liver".  ED PROGRESS: Patient's labs show leukocytosis of 17,000 with left shift.  Normal lactate.  Flu swab negative.  Chest x-ray shows bilateral pneumonia.  Urine also appears infected.  Urine culture, blood cultures pending.   5:59 AM Discussed patient's case with hospitalist, Dr. Myna Hidalgo.  I have recommended admission and patient (and family if present) agree with this plan. Admitting physician will place admission orders.   I reviewed all nursing notes, vitals, pertinent previous records, EKGs, lab and urine results, imaging (as available).   CT of the abdomen pelvis shows no acute pathology to explain patient's abdominal pain.    EKG Interpretation  Date/Time:  Thursday November 29 2018 03:25:42 EST Ventricular Rate:  123 PR Interval:    QRS Duration: 95 QT Interval:  325 QTC Calculation: 463 R Axis:   38 Text Interpretation:  Sinus tachycardia Ventricular premature complex Borderline T wave abnormalities Baseline wander in lead(s) V5 Partial missing lead(s): V2 No old tracing to compare Confirmed by Heru Montz, Cyril Mourning [phone]) on 11/29/2018 3:31:12 AM         CRITICAL CARE Performed by: Cyril Mourning Merick Kelleher   Total critical care time: 65 minutes  Critical care time was exclusive of separately billable procedures and treating other patients.  Critical care was necessary to treat or prevent imminent or life-threatening deterioration.  Critical care was time spent personally by me on the following activities: development of treatment plan with patient and/or surrogate as well as nursing, discussions with consultants, evaluation of patient's response to treatment, examination of patient, obtaining history from patient or surrogate, ordering and performing treatments and interventions, ordering and review of laboratory studies, ordering and review of radiographic studies, pulse oximetry and re-evaluation of patient's condition.    Shatarra Wehling,  Delice Bison, DO 11/29/18 [place], Delice Bison, DO 11/29/18 [phone]

## 2018-11-29 NOTE — ED Notes (Signed)
Patient transported to CT 

## 2018-11-30 DIAGNOSIS — A419 Sepsis, unspecified organism: Secondary | ICD-10-CM | POA: Diagnosis not present

## 2018-11-30 LAB — BASIC METABOLIC PANEL
Anion gap: 10 (ref 5–15)
BUN: 8 mg/dL (ref 8–23)
CO2: 22 mmol/L (ref 22–32)
Calcium: 8.3 mg/dL — ABNORMAL LOW (ref 8.9–10.3)
Chloride: 107 mmol/L (ref 98–111)
Creatinine, Ser: 0.73 mg/dL (ref 0.61–1.24)
GFR calc Af Amer: >60 mL/min (ref >60)
GFR calc non Af Amer: >60 mL/min (ref >60)
GLUCOSE: 114 mg/dL — AB (ref 70–99)
Potassium: 3.3 mmol/L — ABNORMAL LOW (ref 3.5–5.1)
Sodium: 139 mmol/L (ref 135–145)

## 2018-11-30 LAB — CBC
HCT: 36.1 % — ABNORMAL LOW (ref 39.0–52.0)
Hemoglobin: 11.6 g/dL — ABNORMAL LOW (ref 13.0–17.0)
MCH: 29.8 pg (ref 26.0–34.0)
MCHC: 32.1 g/dL (ref 30.0–36.0)
MCV: 92.8 fL (ref 80.0–100.0)
Platelets: 228 10*3/uL (ref 150–400)
RBC: 3.89 MIL/uL — ABNORMAL LOW (ref 4.22–5.81)
RDW: 13.2 % (ref 11.5–15.5)
WBC: 9.3 10*3/uL (ref 4.0–10.5)
nRBC: 0 % (ref 0.0–0.2)

## 2018-11-30 LAB — URINE CULTURE: Culture: NO GROWTH

## 2018-11-30 MED ORDER — SULFAMETHOXAZOLE-TRIMETHOPRIM 800-160 MG PO TABS: 1.0000 | ORAL_TABLET | Freq: Two times a day (BID) | ORAL | 0 refills | Status: AC

## 2018-11-30 NOTE — Discharge Summary (Signed)
Physician Discharge Summary  Kurt Becker YKZ:[id number] DOB: 1950/12/17 DOA: 11/29/2018  PCP: Patient, No Pcp Per  Admit date: 11/29/2018 Discharge date: 11/30/2018  Time spent: 35 minutes  Recommendations for Outpatient Follow-up:  1. Will need to complete at least 5 days of antibiotics for culture-negative UTI  Discharge Diagnoses:  Principal Problem:   Sepsis ([place]) Active Problems:   Chronic pain   COPD (chronic obstructive pulmonary disease) (HCC)   Essential hypertension   Hypokalemia   Pulmonary arterial thrombosis (HCC)   Urinary tract infection associated with catheterization of urinary tract [facility])   Urinary retention   Discharge Condition: Improved  Diet recommendation: Heart healthy  Filed Weights   11/29/18 0300 11/29/18 9390  Weight: 97.2 kg 100.1 kg    History of present illness:  68 year old male history of intermittent UTI secondary to neurogenic bladder Prior ESBL UTIs Recent PE November 2019 Status post lung resection for benign nodule HTN COPD with home oxygen Chronic pain on Dilaudid presented with fever abdominal pain urgency-found to have fever 102.8 heart rate 120s lactic acid 1.6 UA with bacteriuria CT showed perinephric stranding postop changes right lobe  Patient stabilized quickly in the hospital and sepsis physiology resolved rapidly he had no further fevers he was kept on meropenem transiently and urine cultures did not show any growth till bacteria I discussed with him the need for further antibiotic use and he will likely need 5 days of Bactrim p.o. to clear and sterilize his urine otherwise he is stable for discharge and at his request wishes to go back to [place] for further management as he has an appointment on Monday with a liver specialist there have been no overt changes to other medications he will not be prescribed opiates on discharge   Discharge Exam: Vitals:   11/30/18 0447 11/30/18 1002  BP: 98/67   Pulse: 66   Resp: 16    Temp: 97.7 F (36.5 C)   SpO2: 90% 92%    General: Awake pleasant oriented no distress Cardiovascular: S1-S2 no murmur rub or gallop Respiratory: Clinically clear no added sound Abdomen soft nontender no lower extremity edema Neurologically intact Eating drinking pleasant  Discharge Instructions   Discharge Instructions    Diet - low sodium heart healthy   Complete by:  As directed    Increase activity slowly   Complete by:  As directed      Allergies as of 11/30/2018      Reactions   Hydrocodone    "bad for my liver"   Percocet [oxycodone-acetaminophen]    "makes me whacked out"      Medication List    TAKE these medications   albuterol 108 (90 Base) MCG/ACT inhaler Commonly known as:  PROVENTIL HFA;VENTOLIN HFA Inhale 1-2 puffs into the lungs every 6 (six) hours as needed for wheezing or shortness of breath.   atorvastatin 40 MG tablet Commonly known as:  LIPITOR Take 40 mg by mouth daily.   budesonide-formoterol 160-4.5 MCG/ACT inhaler Commonly known as:  SYMBICORT Inhale 2 puffs into the lungs 2 (two) times daily.   ELIQUIS 5 MG Tabs tablet Generic drug:  apixaban Take 5 mg by mouth 2 (two) times daily.   hydrochlorothiazide 25 MG tablet Commonly known as:  HYDRODIURIL Take 25 mg by mouth daily.   HYDROmorphone 4 MG tablet Commonly known as:  DILAUDID Take 4 mg by mouth every 3 (three) hours as needed for severe pain.   meclizine 25 MG tablet Commonly known as:  ANTIVERT Take  25 mg by mouth 3 (three) times daily.   Melatonin 5 MG Tabs Take 5 mg by mouth at bedtime as needed (sleep).   pantoprazole 40 MG tablet Commonly known as:  PROTONIX Take 40 mg by mouth daily.   pregabalin 50 MG capsule Commonly known as:  LYRICA Take 50 mg by mouth 2 (two) times daily.   sennosides-docusate sodium 8.6-50 MG tablet Commonly known as:  SENOKOT-S Take 1 tablet by mouth daily.   sulfamethoxazole-trimethoprim 800-160 MG tablet Commonly known as:   BACTRIM DS,SEPTRA DS Take 1 tablet by mouth 2 (two) times daily.   vitamin C 1000 MG tablet Take 1,000 mg by mouth daily.      Allergies  Allergen Reactions  . Hydrocodone     "bad for my liver"  . Percocet [Oxycodone-Acetaminophen]     "makes me whacked out"      The results of significant diagnostics from this hospitalization (including imaging, microbiology, ancillary and laboratory) are listed below for reference.    Significant Diagnostic Studies: Dg Chest 2 View  Result Date: 11/29/2018 CLINICAL DATA:  Acute onset of cough, fever and shortness of breath. EXAM: CHEST - 2 VIEW COMPARISON:  None. FINDINGS: The lungs are well-aerated. Mild right midlung and medial left basilar airspace opacities raise concern for pneumonia. There is no evidence of pleural effusion or pneumothorax. The heart is normal in size; the mediastinal contour is within normal limits. No acute osseous abnormalities are seen. Cervical spinal fusion hardware is partially imaged. IMPRESSION: Mild right midlung and medial left basilar airspace opacities raise concern for pneumonia. Followup PA and lateral chest X-ray is recommended in 3-4 weeks following trial of antibiotic therapy to ensure resolution and exclude underlying malignancy. Electronically Signed   By: Garald Balding M.D.   On: 11/29/2018 04:17   Ct Abdomen Pelvis W Contrast  Result Date: 11/29/2018 CLINICAL DATA:  Acute onset of generalized abdominal pain and nausea. Right upper quadrant distention. Leukocytosis. EXAM: CT ABDOMEN AND PELVIS WITH CONTRAST TECHNIQUE: Multidetector CT imaging of the abdomen and pelvis was performed using the standard protocol following bolus administration of intravenous contrast. CONTRAST:  125mL ISOVUE-300 IOPAMIDOL (ISOVUE-300) INJECTION 61% COMPARISON:  None. FINDINGS: Lower chest: Postoperative change is noted at the right lung base. Underlying hazy airspace opacity is thought to reflect post treatment change, though  pneumonia could have a similar appearance. A mildly loculated trace right pleural effusion is noted. The visualized portions of the mediastinum are unremarkable. Hepatobiliary: The liver is unremarkable in appearance. The gallbladder is unremarkable in appearance. The common bile duct remains normal in caliber. Pancreas: The pancreas is within normal limits. Spleen: The spleen is unremarkable in appearance. Adrenals/Urinary Tract: The adrenal glands are unremarkable in appearance. Nonspecific perinephric stranding is noted bilaterally. There is no evidence of hydronephrosis. No renal or ureteral stones are identified. Stomach/Bowel: The stomach is unremarkable in appearance. The small bowel is within normal limits. The appendix is normal in caliber, without evidence of appendicitis. Scattered diverticulosis is noted along the descending and sigmoid colon, without evidence of diverticulitis. Vascular/Lymphatic: Scattered calcification is seen along the abdominal aorta and its branches. The abdominal aorta is otherwise grossly unremarkable. The inferior vena cava is grossly unremarkable. No retroperitoneal lymphadenopathy is seen. No pelvic sidewall lymphadenopathy is identified. Reproductive: The bladder is relatively decompressed and not well characterized. The prostate is borderline normal in size. Other: A small left inguinal hernia is noted, containing only fat. Musculoskeletal: No acute osseous abnormalities are identified. The visualized musculature is  unremarkable in appearance. IMPRESSION: 1. No acute abnormality seen to explain the patient's symptoms. 2. Postoperative change at the right lung base. Underlying hazy airspace opacity is thought to reflect post treatment change, though pneumonia could have a similar appearance. Mildly loculated trace right pleural effusion noted. 3. Scattered diverticulosis along the descending and sigmoid colon, without evidence of diverticulitis. 4. Small left inguinal hernia,  containing only fat. Aortic Atherosclerosis (ICD10-I70.0). Electronically Signed   By: Garald Balding M.D.   On: 11/29/2018 06:18    Microbiology: Recent Results (from the past 240 hour(s))  Blood Culture (routine x 2)     Status: None (Preliminary result)   Collection Time: 11/29/18  3:34 AM  Result Value Ref Range Status   Specimen Description   Final    BLOOD LEFT ANTECUBITAL Performed at [facility] [address], [place], [place] [address]    Special Requests   Final    BOTTLES DRAWN AEROBIC AND ANAEROBIC Blood Culture adequate volume Performed at [facility] [address] [place], [place] [address]    Culture  Setup Time   Final    GRAM POSITIVE RODS ANAEROBIC BOTTLE ONLY CRITICAL RESULT CALLED TO, READ BACK BY AND VERIFIED WITH: PHARMD A PHAM [id number] Performed at [facility], [place] [address] [place], [place] [address]    Culture GRAM POSITIVE RODS  Final   Report Status PENDING  Incomplete  Blood Culture (routine x 2)     Status: None (Preliminary result)   Collection Time: 11/29/18  3:46 AM  Result Value Ref Range Status   Specimen Description   Final    BLOOD RIGHT ARM Performed at [facility] [address] [place], [place] [address]    Special Requests   Final    BOTTLES DRAWN AEROBIC AND ANAEROBIC Blood Culture results may not be optimal due to an excessive volume of blood received in culture bottles Performed at [facility] [address] [place], [place] [address]    Culture   Final    NO GROWTH 1 DAY Performed at [facility], [place] [address], [place], [place] [address]    Report Status PENDING  Incomplete  Urine culture     Status: None   Collection Time: 11/29/18  5:25 AM  Result Value Ref Range Status   Specimen Description   Final    URINE, CATHETERIZED Performed at [facility] [address], [place], [place] [address]     Special Requests   Final    NONE Performed at [facility], [place] [address] [place], [place] [address]    Culture   Final    NO GROWTH Performed at [facility], [place] [address], [place],  [address]    Report Status 11/30/2018 FINAL  Final     Labs: Basic Metabolic Panel: Recent Labs  Lab 11/29/18 0334 11/29/18 0346 11/30/18 0623  NA 141  --  139  K 2.5*  --  3.3*  CL 102  --  107  CO2 25  --  22  GLUCOSE 143*  --  114*  BUN 15  --  8  CREATININE 0.87  --  0.73  CALCIUM 9.0  --  8.3*  MG  --  2.0  --    Liver Function Tests: Recent Labs  Lab 11/29/18 0334  AST 17  ALT 21  ALKPHOS 98  BILITOT 0.7  PROT  7.6  ALBUMIN 3.7   Recent Labs  Lab 11/29/18 0334  LIPASE 31   No results for input(s): AMMONIA in the last 168 hours. CBC: Recent Labs  Lab 11/29/18 0334 11/30/18 0623  WBC 17.2* 9.3  NEUTROABS 15.1*  --   HGB 14.0 11.6*  HCT 42.1 36.1*  MCV 90.1 92.8  PLT 289 228   Cardiac Enzymes: No results for input(s): CKTOTAL, CKMB, CKMBINDEX, TROPONINI in the last 168 hours. BNP: BNP (last 3 results) No results for input(s): BNP in the last 8760 hours.  ProBNP (last 3 results) No results for input(s): PROBNP in the last 8760 hours.  CBG: No results for input(s): GLUCAP in the last 168 hours.     Signed:  Nita Sells MD   [facility] 11/30/2018, 10:15 AM

## 2018-11-30 NOTE — Care Management Note (Signed)
Case Management Note  Patient Details  Name: Kurt Becker MRN: [id number] Date of Birth: [birth year]  Subjective/Objective:Sepsis. From out of town-[place]. Patient has pcp, states he can afford his meds-will go to pharmacy in [place]. No further CM needs.                    Action/Plan:d/c home.   Expected Discharge Date:  11/30/18               Expected Discharge Plan:  Home/Self Care  In-House Referral:     Discharge planning Services  CM Consult  Post Acute Care Choice:    Choice offered to:     DME Arranged:    DME Agency:     HH Arranged:    HH Agency:     Status of Service:  Completed, signed off  If discussed at [employer] of Stay Meetings, dates discussed:    Additional Comments:  Dessa Phi, RN 11/30/2018, 11:22 AM

## 2018-11-30 NOTE — Discharge Instructions (Signed)
Urinary Tract Infection, Adult  A urinary tract infection (UTI) is an infection of any part of the urinary tract. The urinary tract includes the kidneys, ureters, bladder, and urethra. These organs make, store, and get rid of urine in the body. Your health care provider may use other names to describe the infection. An upper UTI affects the ureters and kidneys (pyelonephritis). A lower UTI affects the bladder (cystitis) and urethra (urethritis). What are the causes? Most urinary tract infections are caused by bacteria in your genital area, around the entrance to your urinary tract (urethra). These bacteria grow and cause inflammation of your urinary tract. What increases the risk? You are more likely to develop this condition if:  You have a urinary catheter that stays in place (indwelling).  You are not able to control when you urinate or have a bowel movement (you have incontinence).  You are male and you: ? Use a spermicide or diaphragm for birth control. ? Have low estrogen levels. ? Are pregnant.  You have certain genes that increase your risk (genetics).  You are sexually active.  You take antibiotic medicines.  You have a condition that causes your flow of urine to slow down, such as: ? An enlarged prostate, if you are male. ? Blockage in your urethra (stricture). ? A kidney stone. ? A nerve condition that affects your bladder control (neurogenic bladder). ? Not getting enough to drink, or not urinating often.  You have certain medical conditions, such as: ? Diabetes. ? A weak disease-fighting system (immunesystem). ? Sickle cell disease. ? Gout. ? Spinal cord injury. What are the signs or symptoms? Symptoms of this condition include:  Needing to urinate right away (urgently).  Frequent urination or passing small amounts of urine frequently.  Pain or burning with urination.  Blood in the urine.  Urine that smells bad or unusual.  Trouble urinating.  Cloudy  urine.  Vaginal discharge, if you are male.  Pain in the abdomen or the lower back. You may also have:  Vomiting or a decreased appetite.  Confusion.  Irritability or tiredness.  A fever.  Diarrhea. The first symptom in older adults may be confusion. In some cases, they may not have any symptoms until the infection has worsened. How is this diagnosed? This condition is diagnosed based on your medical history and a physical exam. You may also have other tests, including:  Urine tests.  Blood tests.  Tests for sexually transmitted infections (STIs). If you have had more than one UTI, a cystoscopy or imaging studies may be done to determine the cause of the infections. How is this treated? Treatment for this condition includes:  Antibiotic medicine.  Over-the-counter medicines to treat discomfort.  Drinking enough water to stay hydrated. If you have frequent infections or have other conditions such as a kidney stone, you may need to see a health care provider who specializes in the urinary tract (urologist). In rare cases, urinary tract infections can cause sepsis. Sepsis is a life-threatening condition that occurs when the body responds to an infection. Sepsis is treated in the hospital with IV antibiotics, fluids, and other medicines. Follow these instructions at home:  Medicines  Take over-the-counter and prescription medicines only as told by your health care provider.  If you were prescribed an antibiotic medicine, take it as told by your health care provider. Do not stop using the antibiotic even if you start to feel better. General instructions  Make sure you: ? Empty your bladder often and  completely. Do not hold urine for long periods of time. ? Empty your bladder after sex. ? Wipe from front to back after a bowel movement if you are male. Use each tissue one time when you wipe.  Drink enough fluid to keep your urine pale yellow.  Keep all follow-up  visits as told by your health care provider. This is important. Contact a health care provider if:  Your symptoms do not get better after 1-2 days.  Your symptoms go away and then return. Get help right away if you have:  Severe pain in your back or your lower abdomen.  A fever.  Nausea or vomiting. Summary  A urinary tract infection (UTI) is an infection of any part of the urinary tract, which includes the kidneys, ureters, bladder, and urethra.  Most urinary tract infections are caused by bacteria in your genital area, around the entrance to your urinary tract (urethra).  Treatment for this condition often includes antibiotic medicines.  If you were prescribed an antibiotic medicine, take it as told by your health care provider. Do not stop using the antibiotic even if you start to feel better.  Keep all follow-up visits as told by your health care provider. This is important. This information is not intended to replace advice given to you by your health care provider. Make sure you discuss any questions you have with your health care provider. Document Released: 08/24/2005 Document Revised: 05/24/2018 Document Reviewed: 05/24/2018 Elsevier Interactive Patient Education  2019 [place]. Sulfamethoxazole; Trimethoprim, SMX-TMP tablets What is this medicine? SULFAMETHOXAZOLE; TRIMETHOPRIM or SMX-TMP (suhl fuh meth OK suh zohl; trye METH oh prim) is a combination of a sulfonamide antibiotic and a second antibiotic, trimethoprim. It is used to treat or prevent certain kinds of bacterial infections. It will not work for colds, flu, or other viral infections. This medicine may be used for other purposes; ask your health care provider or pharmacist if you have questions. COMMON BRAND NAME(S): Bacter-Aid DS, Bactrim, Bactrim DS, Septra, Septra DS What should I tell my health care provider before I take this medicine? They need to know if you have any of these  conditions: -anemia -asthma -being treated with anticonvulsants -if you frequently drink alcohol containing drinks -kidney disease -liver disease -low level of folic acid or GBTDVVO-1-[id number] dehydrogenase -poor nutrition or malabsorption -porphyria -severe allergies -thyroid disorder -an unusual or allergic reaction to sulfamethoxazole, trimethoprim, sulfa drugs, other medicines, foods, dyes, or preservatives -pregnant or trying to get pregnant -breast-feeding How should I use this medicine? Take this medicine by mouth with a full glass of water. Follow the directions on the prescription label. Take your medicine at regular intervals. Do not take it more often than directed. Do not skip doses or stop your medicine early. Talk to your pediatrician regarding the use of this medicine in children. Special care may be needed. This medicine has been used in children as young as 62 months of age. Overdosage: If you think you have taken too much of this medicine contact a poison control center or emergency room at once. NOTE: This medicine is only for you. Do not share this medicine with others. What if I miss a dose? If you miss a dose, take it as soon as you can. If it is almost time for your next dose, take only that dose. Do not take double or extra doses. What may interact with this medicine? Do not take this medicine with any of the following medications: -aminobenzoate potassium -dofetilide -metronidazole  This medicine may also interact with the following medications: -ACE inhibitors like benazepril, enalapril, lisinopril, and ramipril -birth control pills -cyclosporine -digoxin -diuretics -indomethacin -medicines for diabetes -methenamine -methotrexate -phenytoin -potassium supplements -pyrimethamine -sulfinpyrazone -tricyclic antidepressants -warfarin This list may not describe all possible interactions. Give your health care provider a list of all the medicines, herbs,  non-prescription drugs, or dietary supplements you use. Also tell them if you smoke, drink alcohol, or use illegal drugs. Some items may interact with your medicine. What should I watch for while using this medicine? Tell your doctor or health care professional if your symptoms do not improve. Drink several glasses of water a day to reduce the risk of kidney problems. Do not treat diarrhea with over the counter products. Contact your doctor if you have diarrhea that lasts more than 2 days or if it is severe and watery. This medicine can make you more sensitive to the sun. Keep out of the sun. If you cannot avoid being in the sun, wear protective clothing and use a sunscreen. Do not use sun lamps or tanning beds/booths. What side effects may I notice from receiving this medicine? Side effects that you should report to your doctor or health care professional as soon as possible: -allergic reactions like skin rash or hives, swelling of the face, lips, or tongue -breathing problems -fever or chills, sore throat -irregular heartbeat, chest pain -joint or muscle pain -pain or difficulty passing urine -red pinpoint spots on skin -redness, blistering, peeling or loosening of the skin, including inside the mouth -unusual bleeding or bruising -unusually weak or tired -yellowing of the eyes or skin Side effects that usually do not require medical attention (report to your doctor or health care professional if they continue or are bothersome): -diarrhea -dizziness -headache -loss of appetite -nausea, vomiting -nervousness This list may not describe all possible side effects. Call your doctor for medical advice about side effects. You may report side effects to FDA at 1-800-FDA-1088. Where should I keep my medicine? Keep out of the reach of children. Store at room temperature between 20 to 25 degrees C (68 to 77 degrees F). Protect from light. Throw away any unused medicine after the expiration  date. NOTE: This sheet is a summary. It may not cover all possible information. If you have questions about this medicine, talk to your doctor, pharmacist, or health care provider.  2019 Elsevier/Gold Standard (2013-06-21 14:38:26)

## 2018-11-30 NOTE — Progress Notes (Signed)
Pt discharged home today per Dr. Verlon Au. Pt's IV sites D/C'd and WDL. Pt's VSS. Pt provided with home medication list, discharge instructions and prescriptions. Verbalized understanding. Pt left floor via WC in stable condition accompanied by RN.

## 2018-12-01 LAB — CULTURE, BLOOD (ROUTINE X 2): Special Requests: ADEQUATE

## 2018-12-04 LAB — CULTURE, BLOOD (ROUTINE X 2): Culture: NO GROWTH

## 2019-10-02 IMAGING — CR DG CHEST 2V
2 series · 2 of 2 positions shown · non-contrast
Comparison: None.

CLINICAL DATA: Acute onset of cough, fever and shortness of breath.

EXAM:
CHEST - 2 VIEW

[w chest lat]
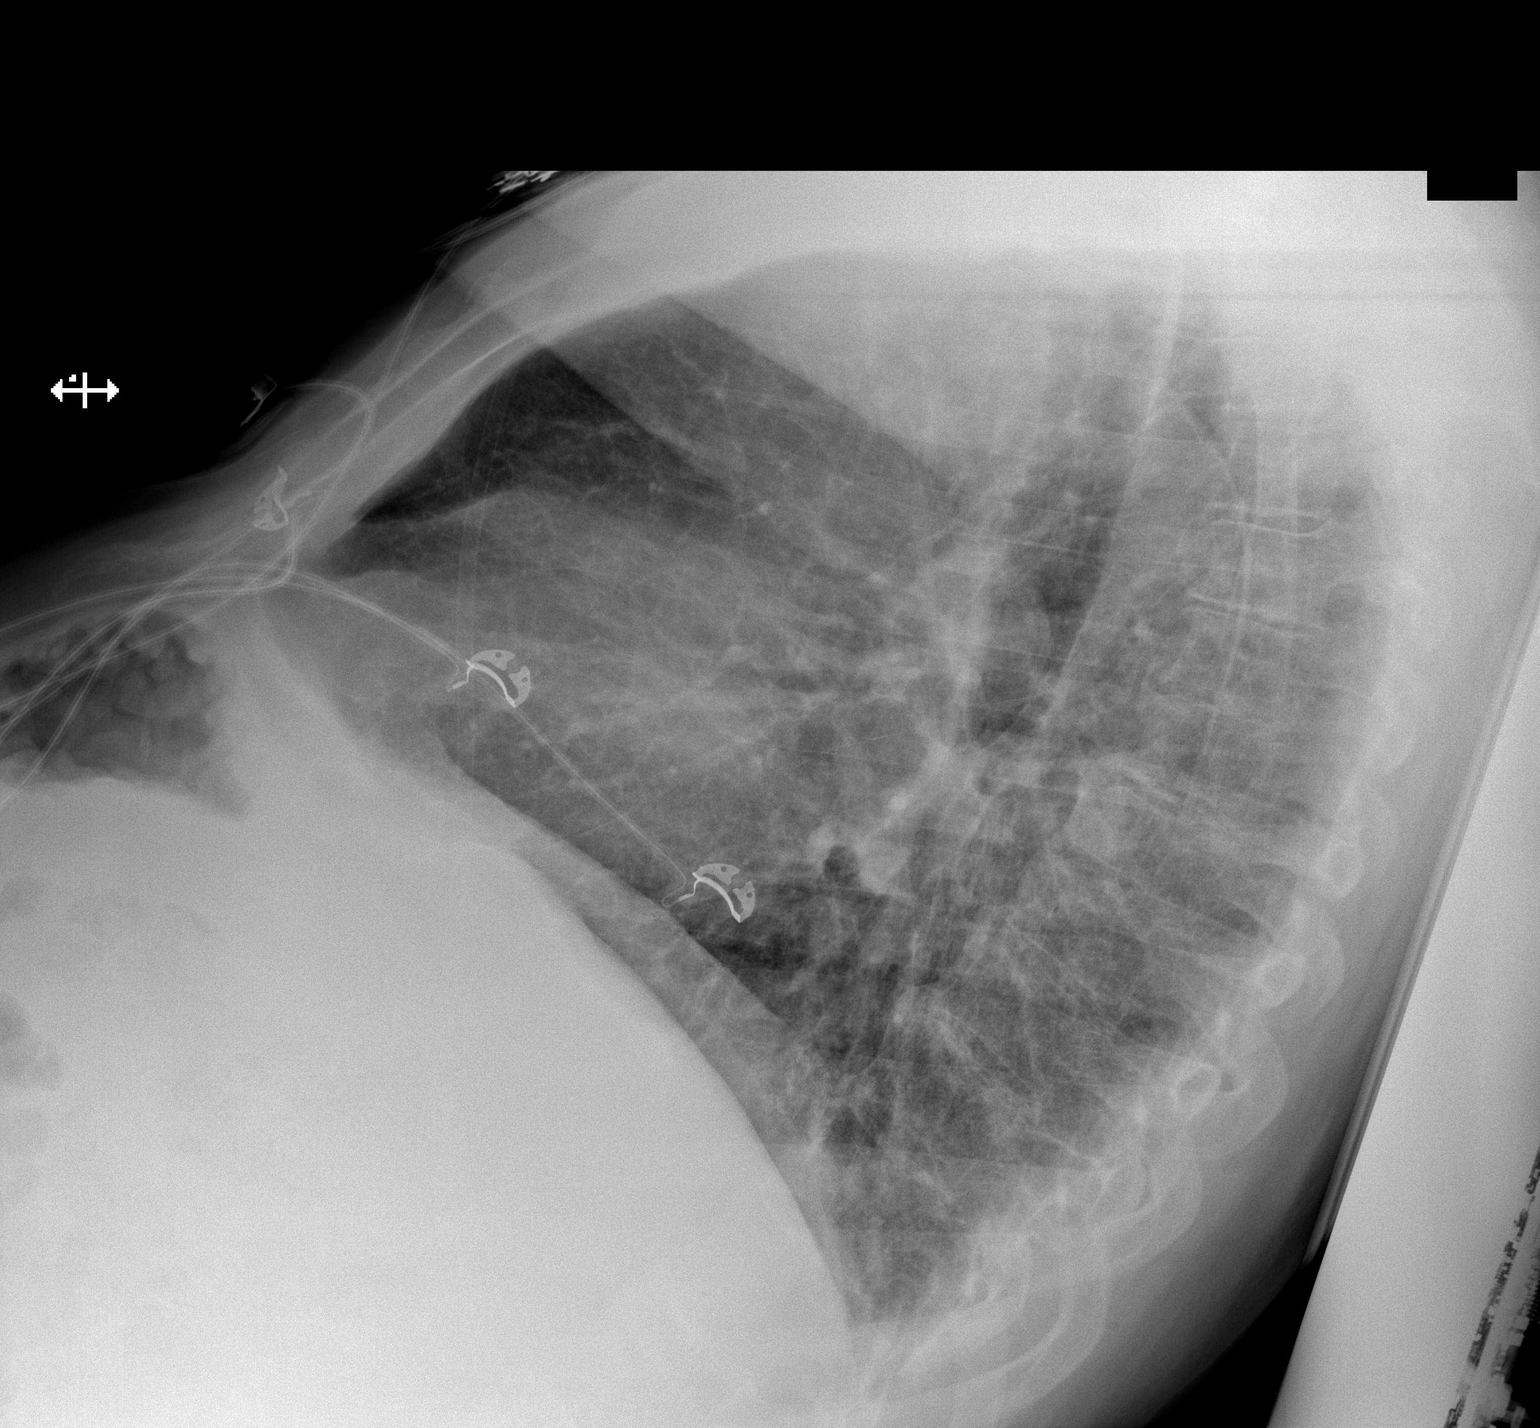

[x chest ap]
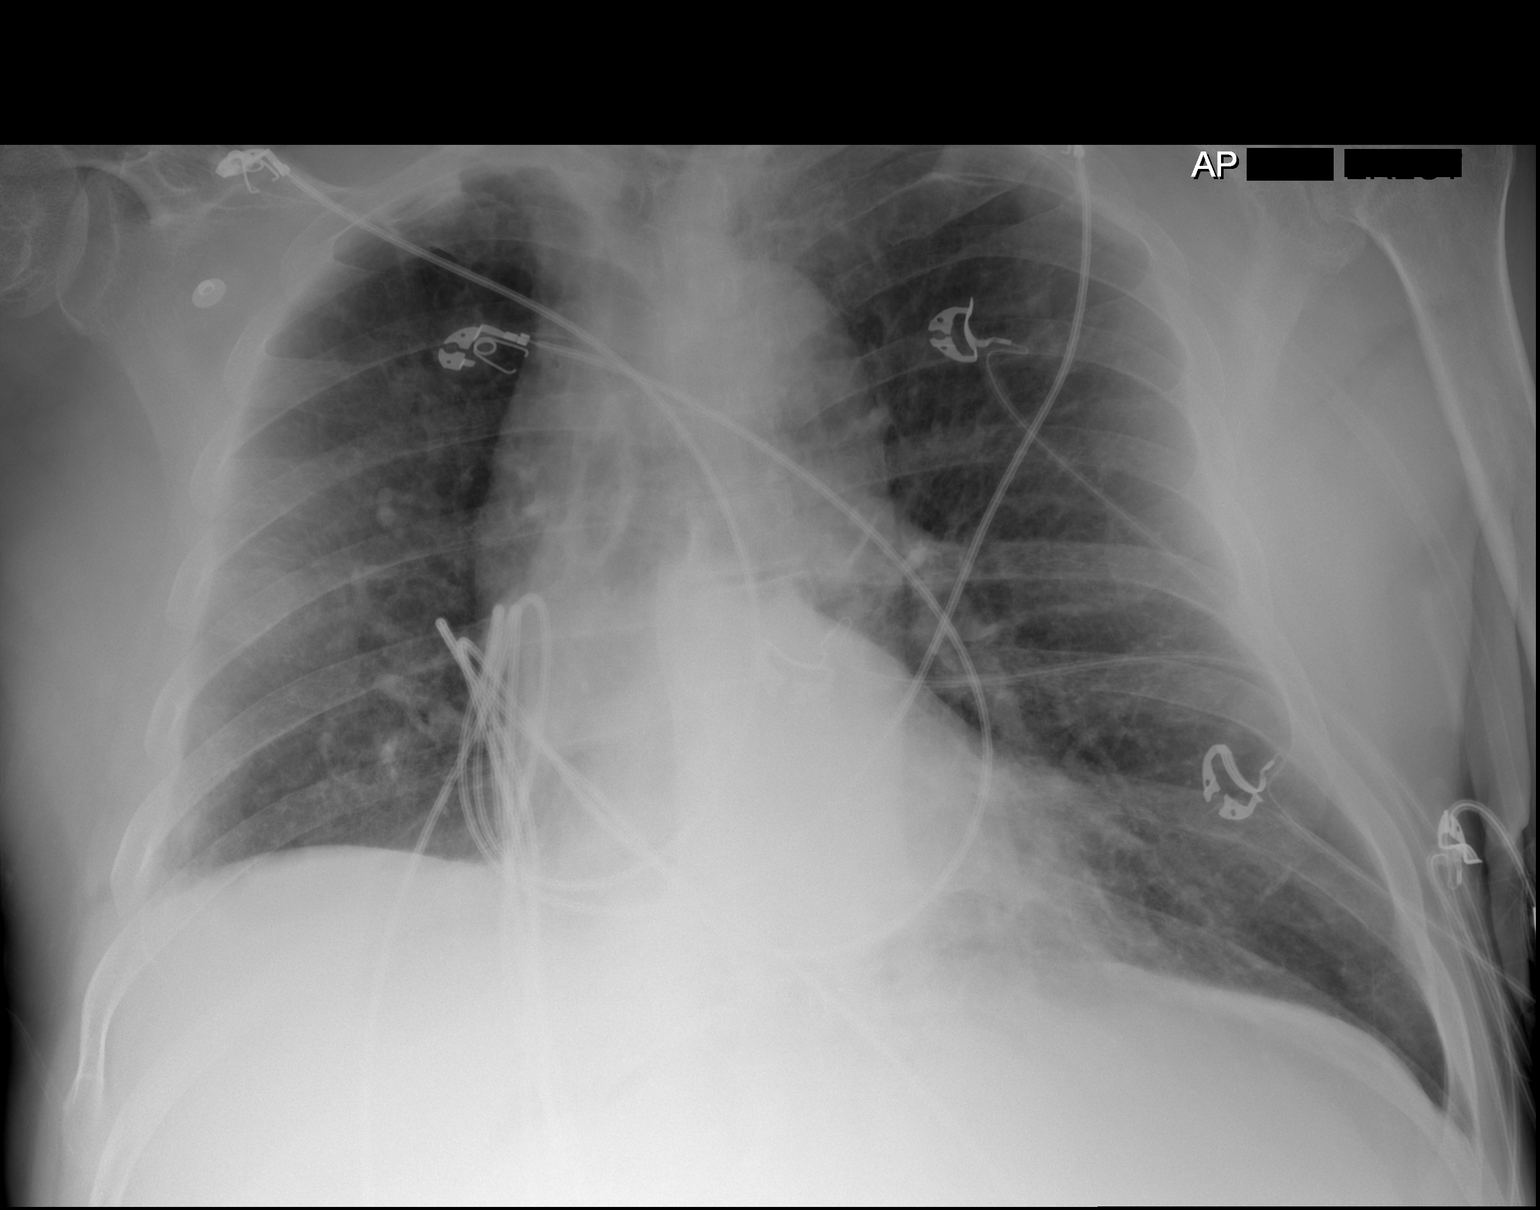

[2 of 2 positions shown; findings below may reference images not displayed]

FINDINGS: The lungs are well-aerated. Mild right midlung and medial left
basilar airspace opacities raise concern for pneumonia. There is no
evidence of pleural effusion or pneumothorax.

The heart is normal in size; the mediastinal contour is within
normal limits. No acute osseous abnormalities are seen. Cervical
spinal fusion hardware is partially imaged.
IMPRESSION: Mild right midlung and medial left basilar airspace opacities raise
concern for pneumonia.

Followup PA and lateral chest X-ray is recommended in 3-4 weeks
following trial of antibiotic therapy to ensure resolution and
exclude underlying malignancy.

## 2019-10-02 IMAGING — CT CT ABD-PELV W/ CM
2 of 5 series · 15 of 46 positions shown, 17 images · IV contrast (ISOVUE)
Comparison: None.

CLINICAL DATA: Acute onset of generalized abdominal pain and
nausea. Right upper quadrant distention. Leukocytosis.

EXAM:
CT ABDOMEN AND PELVIS WITH CONTRAST
TECHNIQUE: Multidetector CT imaging of the abdomen and pelvis was performed
using the standard protocol following bolus administration of
intravenous contrast.
CONTRAST:  100mL HZ2ILC-DMM IOPAMIDOL (HZ2ILC-DMM) INJECTION 61%

[Series 2: axial st · axial · 0.85mm/px · z∈[-541,-136]mm · 12 of 95 slices shown, 14 images]
[im 7/95  soft-tissue]
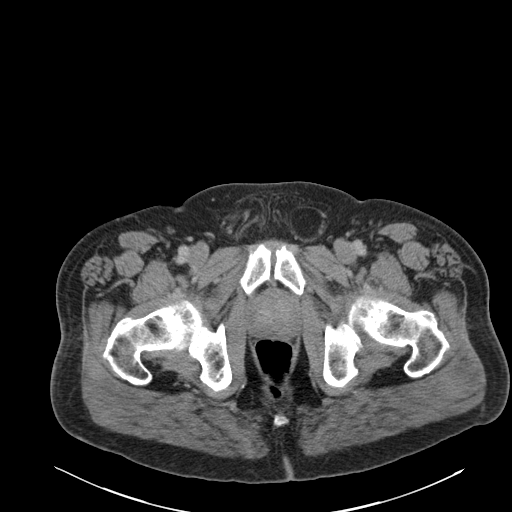
[im 7/95  bone]
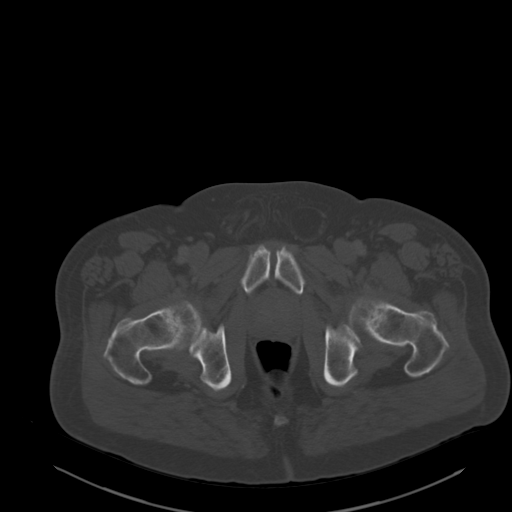
[im 13/95  soft-tissue]
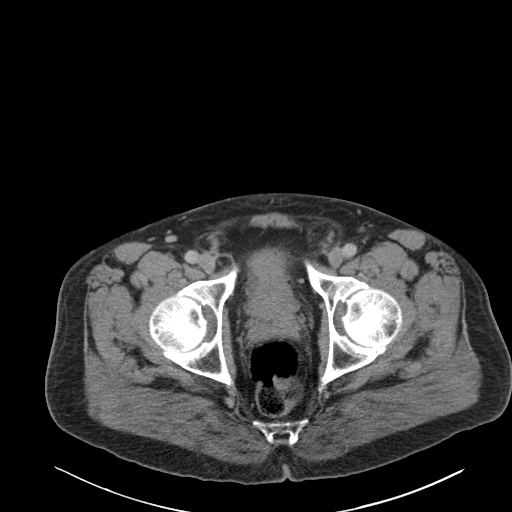
[im 19/95  soft-tissue]
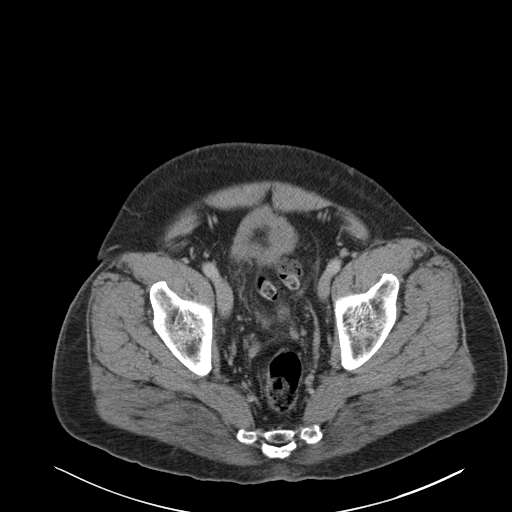
[im 32/95  soft-tissue]
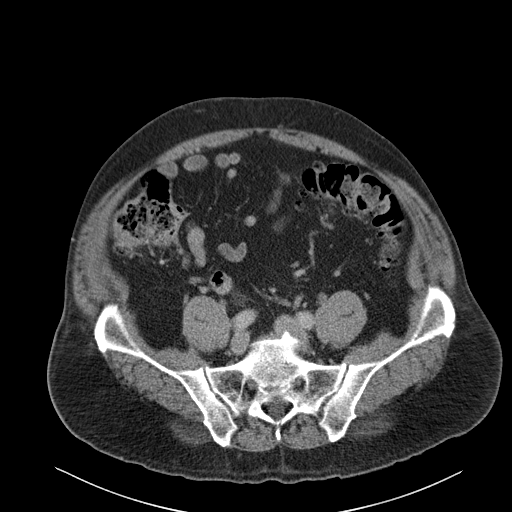
[im 38/95  soft-tissue]
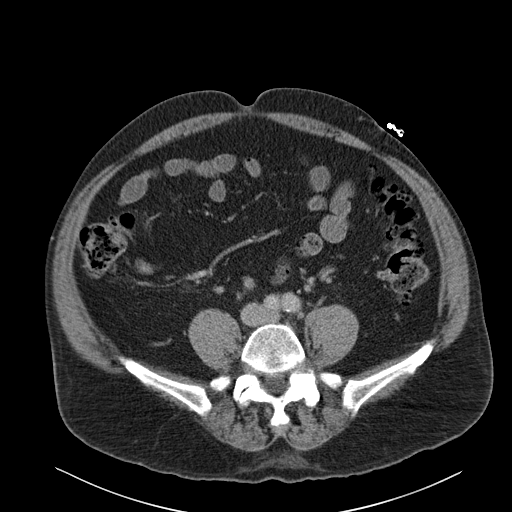
[im 44/95  soft-tissue]
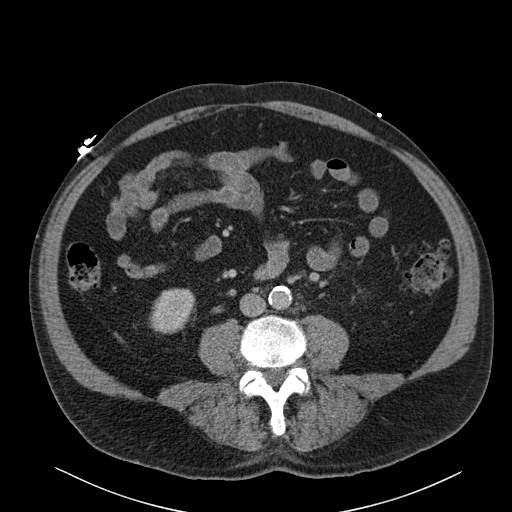
[im 51/95  soft-tissue]
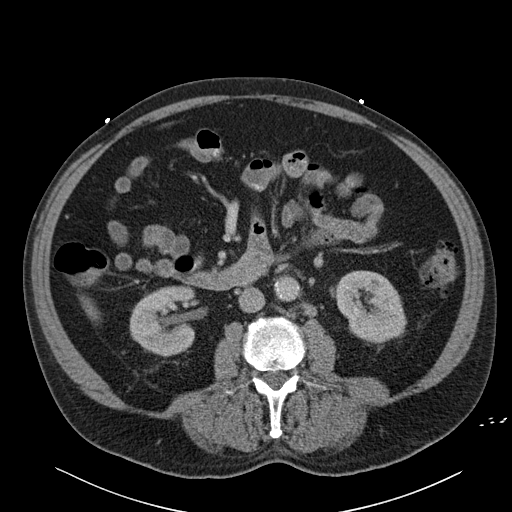
[im 57/95  soft-tissue]
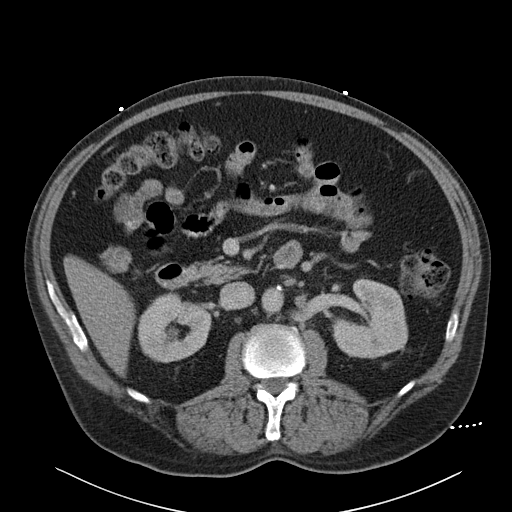
[im 63/95  soft-tissue]
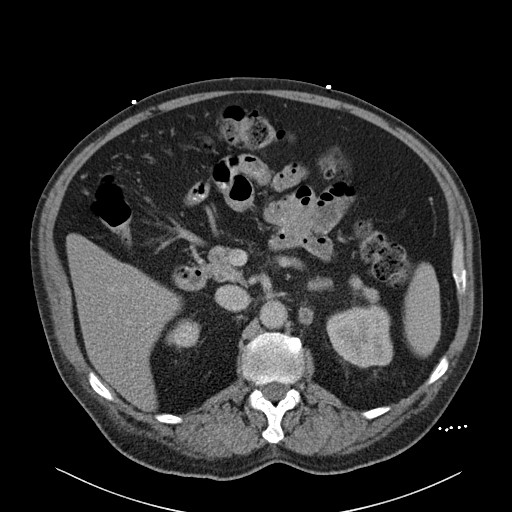
[im 63/95  bone]
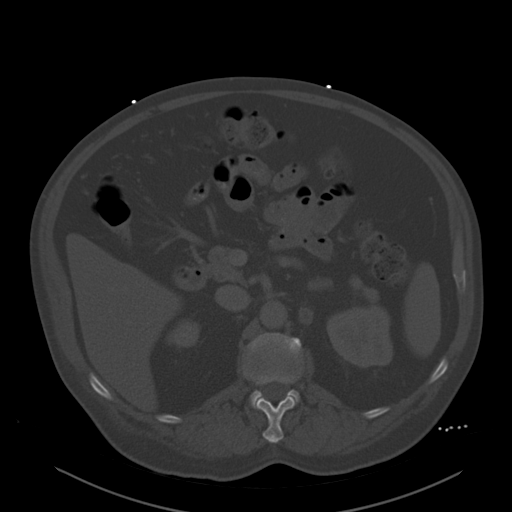
[im 76/95  soft-tissue]
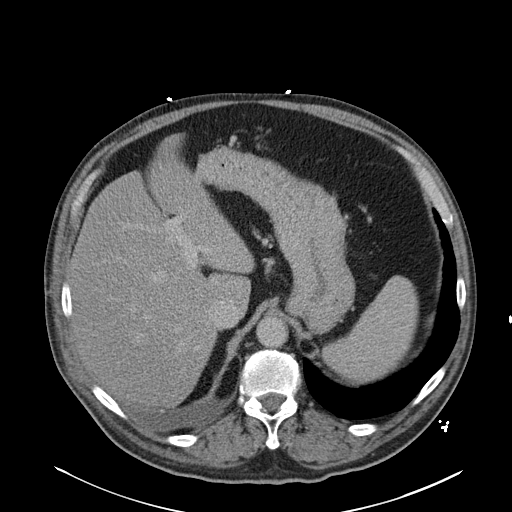
[im 82/95  soft-tissue]
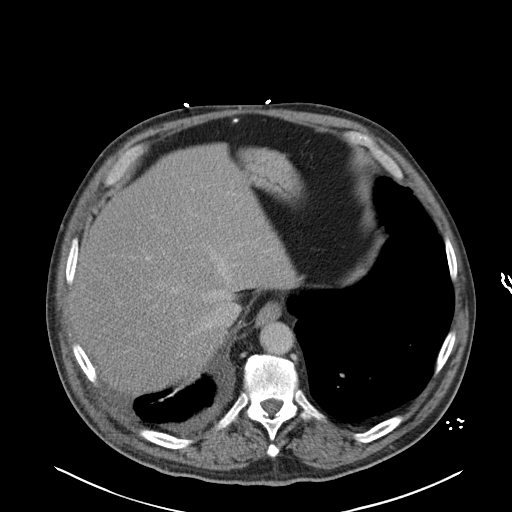
[im 88/95  soft-tissue]
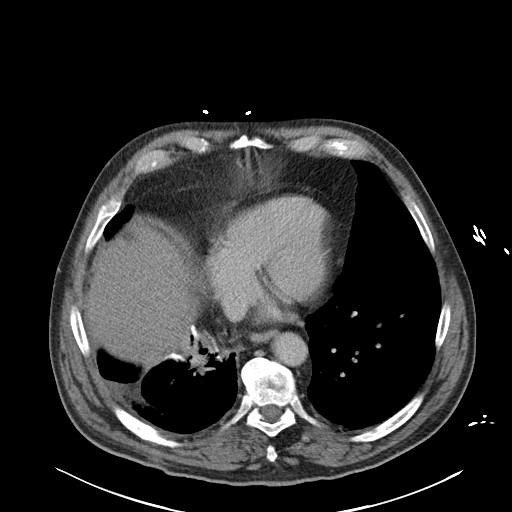

[Series 5: coronal st · coronal · 0.78mm/px · 3 of 111 slices shown]
[im 37/111  soft-tissue]
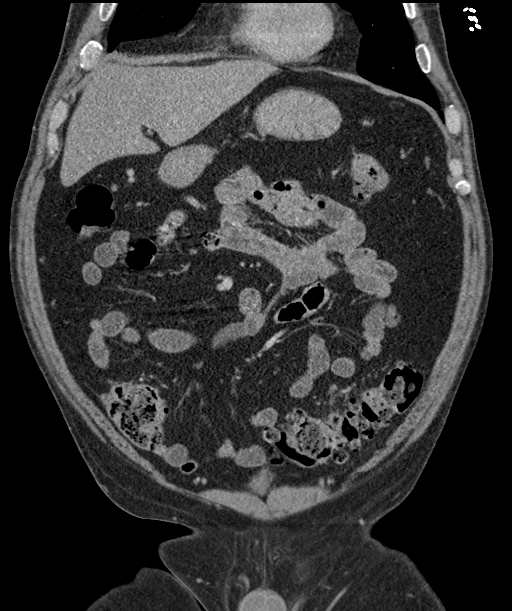
[im 49/111  soft-tissue]
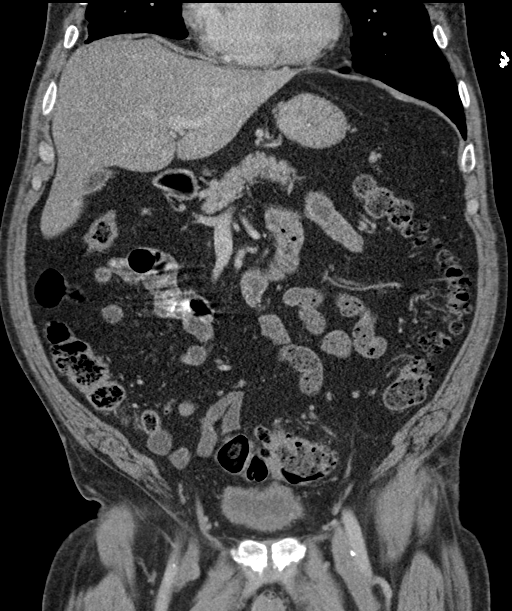
[im 62/111  soft-tissue]
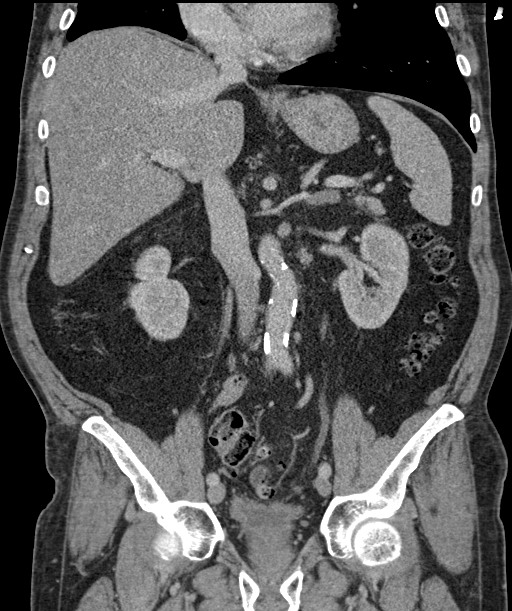

[15 of 46 positions shown; findings below may reference images not displayed]

FINDINGS: Lower chest: Postoperative change is noted at the right lung base.
Underlying hazy airspace opacity is thought to reflect post
treatment change, though pneumonia could have a similar appearance.
A mildly loculated trace right pleural effusion is noted.

The visualized portions of the mediastinum are unremarkable.

Hepatobiliary: The liver is unremarkable in appearance. The
gallbladder is unremarkable in appearance. The common bile duct
remains normal in caliber.

Pancreas: The pancreas is within normal limits.

Spleen: The spleen is unremarkable in appearance.

Adrenals/Urinary Tract: The adrenal glands are unremarkable in
appearance.

Nonspecific perinephric stranding is noted bilaterally. There is no
evidence of hydronephrosis. No renal or ureteral stones are
identified.

Stomach/Bowel: The stomach is unremarkable in appearance. The small
bowel is within normal limits. The appendix is normal in caliber,
without evidence of appendicitis.

Scattered diverticulosis is noted along the descending and sigmoid
colon, without evidence of diverticulitis.

Vascular/Lymphatic: Scattered calcification is seen along the
abdominal aorta and its branches. The abdominal aorta is otherwise
grossly unremarkable. The inferior vena cava is grossly
unremarkable. No retroperitoneal lymphadenopathy is seen. No pelvic
sidewall lymphadenopathy is identified.

Reproductive: The bladder is relatively decompressed and not well
characterized. The prostate is borderline normal in size.

Other: A small left inguinal hernia is noted, containing only fat.

Musculoskeletal: No acute osseous abnormalities are identified. The
visualized musculature is unremarkable in appearance.
IMPRESSION: 1. No acute abnormality seen to explain the patient's symptoms.
2. Postoperative change at the right lung base. Underlying hazy
airspace opacity is thought to reflect post treatment change, though
pneumonia could have a similar appearance. Mildly loculated trace
right pleural effusion noted.
3. Scattered diverticulosis along the descending and sigmoid colon,
without evidence of diverticulitis.
4. Small left inguinal hernia, containing only fat.

Aortic Atherosclerosis ([id number]-DS0.0).

## 2020-01-02 ENCOUNTER — Encounter: Admit: 2020-01-02 | Payer: PRIVATE HEALTH INSURANCE | Attending: Hand Surgery | Primary: Family Medicine

## 2020-01-02 DIAGNOSIS — Z9889 Other specified postprocedural states: Secondary | ICD-10-CM

## 2020-01-06 ENCOUNTER — Inpatient Hospital Stay: Admit: 2020-01-06 | Discharge: 2020-01-06 | Payer: MEDICARE | Primary: Family Medicine

## 2020-01-06 DIAGNOSIS — M720 Palmar fascial fibromatosis [Dupuytren]: Secondary | ICD-10-CM
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
## 2020-01-13 ENCOUNTER — Inpatient Hospital Stay: Admit: 2020-01-13 | Discharge: 2020-01-13 | Payer: MEDICARE | Primary: Family Medicine

## 2020-01-13 DIAGNOSIS — M720 Palmar fascial fibromatosis [Dupuytren]: Secondary | ICD-10-CM

## 2020-01-13 NOTE — Other
OCCUPATIONAL THERAPY - [place]Occupational Therapy - [place] [facility] [place] [address]Occupational Therapy - Hand therapy Daily Note2/15/2021Patient Name:  Henry Ehrler HelgesonMedical Record Number:  [id number] Date of Birth:  1952/07/11Therapist:  [facility], OTR/L, CHTReferring Provider:  Margie Ege, MDICD-10 Diagnosis(es):Problem List         ICD-10-CM   s/p dupuytren's fasciotomy R Thumb and Little Finger  * (Principal) Dupuytren's contracture of right hand M72.0  General InformationTreatment Number:   #2  Date the Treatment Plan was Initiated/Reviewed:  01/06/2020  Start of Care Date:   01/06/2020  Onset of Illness/Injury Date:   12/02/2018  Date of Surgery:   12/19/2019  Progress Report Due Date:   02/20/2020  MD Order Renewal Date:   3/25/2021Medication Review:Current Outpatient Medications Medication Sig ? acetaminophen Take 500 mg by mouth every 6 (six) hours as needed (Pain/Headache). ? albuterol sulfate Inhale 1-2 puffs into the lungs. ? ascorbic acid (vitamin C) Take 1,000 mg by mouth. ? atorvastatin TAKE 1 TABLET BY MOUTH EVERY DAY ? budesonide-formoteroL Inhale 2 puffs into the lungs 2 (two) times daily. (Patient not taking: Reported on 07/30/2019) ? buprenorphine HCL DIS 1 T UNT BID FOR 7 DAYS ? DULoxetine TK 1 C PO QD ? Eliquis TAKE 1 TABLET(5 MG) BY MOUTH TWICE DAILY ? hydroCHLOROthiazide TAKE 1 TABLET BY MOUTH EVERY DAY ? HYDROmorphone 1 tab q 4h prn pain ? lidocaine Place 1 patch onto the skin daily Remove & Discard patch within 12 hours or as directed by MD ? meclizine Take 25 mg by mouth 3 (three) times daily. ? melatonin Take 1 to 2 tablets (5mg  to 10mg ) by mouth nightly as needed for Sleep. ? metoprolol succinate XL TK 1 T PO DAILY ? pantoprazole Take 1 tablet (40 mg total) by mouth daily. ? polyethylene glycol Take 1 packet (17 g total) by mouth daily. Mix in 8 ounces of water, juice, soda, coffee or tea prior to taking. (Patient not taking: Reported on 07/30/2019) ? pregabalin Take 1 capsule (50 mg total) by mouth 2 (two) times daily. ? senna Take 1 tablet by mouth 2 (two) times daily. ? varenicline Take 1 tablet (1mg ) by mouth 2 (two) times daily as directed. SubjectiveIvar reported that he has been wearing the splint day and night, removal for exercise, hygiene and meals, seems quite pleased with extension thus far.ObjectiveAddressed dense scarring at small finger and thumb/palm with [place], deep massage and PROM. Applied silicon mepiform and instructed in nightly use.Treatment Provided This VisitCPT Code Interventions Timed Minutes Untimed Minutes Total Minutes Manual Therapy (97140) As noted,  20   20 Ultrasound (86578) 1 cm head, 100% 3 mhz 10 min total 10  10 Therapeutic Activity (97530) Application of medpform    N/A       Total Treatment Time: 30 AssessmentMaintaining good extension thus farPlanFrequency:  2x per weekPlan for Next Visit: scar management, PROM

## 2020-01-16 ENCOUNTER — Inpatient Hospital Stay: Admit: 2020-01-16 | Discharge: 2020-01-16 | Payer: MEDICARE | Primary: Family Medicine

## 2020-01-16 DIAGNOSIS — Z9889 Other specified postprocedural states: Secondary | ICD-10-CM

## 2020-01-16 NOTE — Other
OCCUPATIONAL THERAPY - LONG [facility]IDGEOccupational Therapy - [place] [facility] [place] [address]Occupational Therapy - Hand Therapy Daily Note2/18/2021Patient Name:  Henry Minichiello HelgesonMedical Record Number:  [id number] Date of Birth:  02/17/1952Therapist:  [facility], OTR/L, CHTReferring Provider:  Margie Ege, MDICD-10 Diagnosis(es):Problem List         ICD-10-CM   s/p dupuytren's fasciotomy R Thumb and Little Finger  * (Principal) Dupuytren's contracture of right hand M72.0  General InformationTreatment Number:???#3??Date the Treatment Plan was Initiated/Reviewed:??01/06/2020??Start of Care Date:???01/06/2020??Onset of Illness/Injury Date:???12/02/2018??Date of Surgery:???12/19/2019??Progress Report Due Date:???02/20/2020??MD Order Renewal Date:???3/25/2021Medication Review:Current Outpatient Medications Medication Sig ? acetaminophen Take 500 mg by mouth every 6 (six) hours as needed (Pain/Headache). ? albuterol sulfate Inhale 1-2 puffs into the lungs. ? ascorbic acid (vitamin C) Take 1,000 mg by mouth. ? atorvastatin TAKE 1 TABLET BY MOUTH EVERY DAY ? budesonide-formoteroL Inhale 2 puffs into the lungs 2 (two) times daily. (Patient not taking: Reported on 07/30/2019) ? buprenorphine HCL DIS 1 T UNT BID FOR 7 DAYS ? DULoxetine TK 1 C PO QD ? Eliquis TAKE 1 TABLET(5 MG) BY MOUTH TWICE DAILY ? hydroCHLOROthiazide TAKE 1 TABLET BY MOUTH EVERY DAY ? HYDROmorphone 1 tab q 4h prn pain ? lidocaine Place 1 patch onto the skin daily Remove & Discard patch within 12 hours or as directed by MD ? meclizine Take 25 mg by mouth 3 (three) times daily. ? melatonin Take 1 to 2 tablets (5mg  to 10mg ) by mouth nightly as needed for Sleep. ? metoprolol succinate XL TK 1 T PO DAILY ? pantoprazole Take 1 tablet (40 mg total) by mouth daily. ? polyethylene glycol Take 1 packet (17 g total) by mouth daily. Mix in 8 ounces of water, juice, soda, coffee or tea prior to taking. (Patient not taking: Reported on 07/30/2019) ? pregabalin Take 1 capsule (50 mg total) by mouth 2 (two) times daily. ? senna Take 1 tablet by mouth 2 (two) times daily. ? varenicline Take 1 tablet (1mg ) by mouth 2 (two) times daily as directed. SubjectiveIt does nto hurt -  reported that he using the hand extension brace thru-out the day 'on and off' and always at night with mepiform scar treatmentObjectiveMH, US,massage and PROMInstructed in putty rolling for daily treatment at home to soften and manage more finger and thumb extensionTreatment Provided This VisitCPT Code Interventions Timed Minutes Untimed Minutes Total Minutes Therapeutic Exercise (97110) As noted, Putty rolling 10  10 Manual Therapy (97140) PROM thu-out 10  10 Ultrasound (40981) 2 cm head 50% pulsed 3 mhz 1.0 wcm2 10 10  10  Heat - Ice Pack  MH prior to treatment  10  1  Total Treatment Time: 30 AssessmentReducing pain, maintaining extensionPlanFrequency:  2x per week	Plan for Next Visit: PROM stretching and introduce passive flexion and light gripping and dexterity tasks

## 2020-01-20 ENCOUNTER — Inpatient Hospital Stay: Admit: 2020-01-20 | Discharge: 2020-01-20 | Payer: MEDICARE | Primary: Family Medicine

## 2020-01-20 DIAGNOSIS — M720 Palmar fascial fibromatosis [Dupuytren]: Secondary | ICD-10-CM

## 2020-01-20 NOTE — Other
OCCUPATIONAL THERAPY - LONG RIDGEOccupational Therapy - [place] [facility] [place] [address]Occupational Therapy - Hand Therapy Daily Note2/22/2021Patient Name:  Henry Leichter HelgesonMedical Record Number:  [id number] Date of Birth:  1952/03/18Therapist:  [facility], OTR/L, CHTReferring Provider:  Margie Ege, MDICD-10 Diagnosis(es):Problem List         ICD-10-CM   s/p dupuytren's fasciotomy R Thumb and Little Finger  * (Principal) Dupuytren's contracture of right hand M72.0  General InformationTreatment Number:???#4??Date the Treatment Plan was Initiated/Reviewed:??01/06/2020??Start of Care Date:???01/06/2020??Onset of Illness/Injury Date:???12/02/2018??Date of Surgery:???12/19/2019??Progress Report Due Date:???02/20/2020??MD Order Renewal Date:???3/25/2021Medication Review:Current Outpatient Medications Medication Sig ? acetaminophen Take 500 mg by mouth every 6 (six) hours as needed (Pain/Headache). ? albuterol sulfate Inhale 1-2 puffs into the lungs. ? ascorbic acid (vitamin C) Take 1,000 mg by mouth. ? atorvastatin TAKE 1 TABLET BY MOUTH EVERY DAY ? budesonide-formoteroL Inhale 2 puffs into the lungs 2 (two) times daily. (Patient not taking: Reported on 07/30/2019) ? buprenorphine HCL DIS 1 T UNT BID FOR 7 DAYS ? DULoxetine TK 1 C PO QD ? Eliquis TAKE 1 TABLET(5 MG) BY MOUTH TWICE DAILY ? hydroCHLOROthiazide TAKE 1 TABLET BY MOUTH EVERY DAY ? HYDROmorphone 1 tab q 4h prn pain ? lidocaine Place 1 patch onto the skin daily Remove & Discard patch within 12 hours or as directed by MD ? meclizine Take 25 mg by mouth 3 (three) times daily. ? melatonin Take 1 to 2 tablets (5mg  to 10mg ) by mouth nightly as needed for Sleep. ? metoprolol succinate XL TK 1 T PO DAILY ? pantoprazole Take 1 tablet (40 mg total) by mouth daily. ? polyethylene glycol Take 1 packet (17 g total) by mouth daily. Mix in 8 ounces of water, juice, soda, coffee or tea prior to taking. (Patient not taking: Reported on 07/30/2019) ? pregabalin Take 1 capsule (50 mg total) by mouth 2 (two) times daily. ? senna Take 1 tablet by mouth 2 (two) times daily. ? varenicline Take 1 tablet (1mg ) by mouth 2 (two) times daily as directed. 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 Per his reported the pain was reduced after [place] and vibration treatment.PlanFrequency:  2x per weekPlan for Next Visit [place], PROM, gripping stretching, vibration

## 2020-01-23 ENCOUNTER — Ambulatory Visit
Admit: 2020-01-23 | Payer: PRIVATE HEALTH INSURANCE | Attending: Rehabilitative and Restorative Service Providers" | Primary: Family Medicine

## 2020-01-24 ENCOUNTER — Encounter: Admit: 2020-01-24 | Payer: PRIVATE HEALTH INSURANCE | Attending: Internal Medicine | Primary: Family Medicine

## 2020-01-24 DIAGNOSIS — I10 Essential (primary) hypertension: Secondary | ICD-10-CM

## 2020-01-24 MED ORDER — HYDROCHLOROTHIAZIDE 25 MG TABLET
25 mg | ORAL_TABLET | Freq: Every day | ORAL | 1 refills | Status: AC
Start: 2020-01-24 — End: 2021-12-28

## 2020-01-26 DIAGNOSIS — Z9889 Other specified postprocedural states: Secondary | ICD-10-CM

## 2020-01-26 DIAGNOSIS — M720 Palmar fascial fibromatosis [Dupuytren]: Secondary | ICD-10-CM

## 2020-01-27 ENCOUNTER — Inpatient Hospital Stay: Admit: 2020-01-27 | Discharge: 2020-01-27 | Payer: MEDICARE | Primary: Family Medicine

## 2020-01-27 DIAGNOSIS — M720 Palmar fascial fibromatosis [Dupuytren]: Secondary | ICD-10-CM

## 2020-01-27 NOTE — Other
[name] did not show for hand therapy appointment today.[facility], OTR/L, [place]

## 2020-01-30 ENCOUNTER — Inpatient Hospital Stay: Admit: 2020-01-30 | Payer: PRIVATE HEALTH INSURANCE

## 2020-01-30 ENCOUNTER — Inpatient Hospital Stay: Admit: 2020-01-30 | Discharge: 2020-02-03 | Payer: MEDICARE | Source: Home / Self Care | Admitting: Internal Medicine

## 2020-01-30 ENCOUNTER — Inpatient Hospital Stay: Admit: 2020-01-30 | Discharge: 2020-01-30 | Payer: MEDICARE | Primary: Family Medicine

## 2020-01-30 ENCOUNTER — Emergency Department: Admit: 2020-01-30 | Payer: PRIVATE HEALTH INSURANCE | Primary: Family Medicine

## 2020-01-30 DIAGNOSIS — R509 Fever, unspecified: Secondary | ICD-10-CM

## 2020-01-30 DIAGNOSIS — M720 Palmar fascial fibromatosis [Dupuytren]: Secondary | ICD-10-CM

## 2020-01-30 LAB — CBC WITH AUTO DIFFERENTIAL
BKR WAM ABSOLUTE IMMATURE GRANULOCYTES: 0.1 x 1000/ÂµL (ref 0.0–0.2)
BKR WAM ABSOLUTE LYMPHOCYTE COUNT: 0.9 x 1000/ÂµL — ABNORMAL LOW (ref 1.0–2.3)
BKR WAM ABSOLUTE NRBC: 0 x 1000/ÂµL (ref <=0.0)
BKR WAM ANALYZER ANC: 11.4 x 1000/ÂµL — ABNORMAL HIGH (ref 1.8–7.3)
BKR WAM BASOPHIL ABSOLUTE COUNT: 0.1 x 1000/ÂµL (ref 0.0–0.6)
BKR WAM BASOPHILS: 0.4 % (ref 0.0–2.0)
BKR WAM EOSINOPHIL ABSOLUTE COUNT: 0.3 x 1000/ÂµL (ref 0.0–0.4)
BKR WAM EOSINOPHILS: 2 % (ref 0.0–6.0)
BKR WAM HEMATOCRIT: 45.6 % (ref 41.0–54.0)
BKR WAM HEMOGLOBIN: 15.3 g/dL (ref 13.7–18.0)
BKR WAM IMMATURE GRANULOCYTES: 0.5 % (ref 0.0–2.0)
BKR WAM LYMPHOCYTES: 6.6 % — ABNORMAL LOW (ref 14.0–43.0)
BKR WAM MCH (PG): 30.3 pg (ref 25.7–31.0)
BKR WAM MCHC: 33.6 g/dL (ref 32.0–36.0)
BKR WAM MCV: 90.3 fL (ref 80.0–99.0)
BKR WAM MONOCYTE ABSOLUTE COUNT: 0.6 x 1000/ÂµL (ref 0.4–1.3)
BKR WAM MONOCYTES: 4.7 % (ref 0.0–14.0)
BKR WAM MPV: 10.2 fL (ref 9.8–12.3)
BKR WAM NEUTROPHILS: 85.8 % — ABNORMAL HIGH (ref 38.0–74.0)
BKR WAM NUCLEATED RED BLOOD CELLS: 0 % (ref 0.0–0.2)
BKR WAM PLATELETS: 301 x1000/ÂµL (ref 140–446)
BKR WAM RDW-CV: 13.4 % (ref 11.5–14.5)
BKR WAM RED BLOOD CELL COUNT: 5.1 M/ÂµL (ref 4.6–6.1)
BKR WAM WHITE BLOOD CELL COUNT: 13.3 x1000/ÂµL — ABNORMAL HIGH (ref 3.8–10.6)

## 2020-01-30 LAB — ZZZURINALYSIS WITH CULTURE REFLEX     (L Q)
BKR BILIRUBIN, UA: NEGATIVE
BKR GLUCOSE, UA: NEGATIVE
BKR KETONES, UA: NEGATIVE
BKR LEUKOCYTE ESTERASE, UA: POSITIVE — AB
BKR NITRITE, UA: POSITIVE — AB
BKR PH, UA: 5.5 (ref 5.5–7.5)
BKR SPECIFIC GRAVITY, UA: 1.021 (ref 1.005–1.030)
BKR UROBILINOGEN, UA: <2 EU/dL (ref <=2.0)

## 2020-01-30 LAB — COMPREHENSIVE METABOLIC PANEL
BKR A/G RATIO: 0.9
BKR ALANINE AMINOTRANSFERASE (ALT): 59 U/L (ref 12–78)
BKR ALBUMIN: 3.7 g/dL (ref 3.4–5.0)
BKR ALKALINE PHOSPHATASE: 188 U/L — ABNORMAL HIGH (ref 20–135)
BKR ANION GAP: 10 (ref 5–18)
BKR ASPARTATE AMINOTRANSFERASE (AST): 23 U/L (ref 5–37)
BKR AST/ALT RATIO: 0.4
BKR BILIRUBIN TOTAL: 0.9 mg/dL (ref 0.0–1.0)
BKR BLOOD UREA NITROGEN: 17 mg/dL (ref 8–25)
BKR BUN / CREAT RATIO: 17 (ref 8.0–25.0)
BKR CALCIUM: 9.2 mg/dL (ref 8.4–10.3)
BKR CHLORIDE: 102 mmol/L (ref 95–115)
BKR CO2: 26 mmol/L (ref 21–32)
BKR CREATININE: 1 mg/dL (ref 0.50–1.30)
BKR EGFR (AFR AMER): >60 mL/min/{1.73_m2} (ref >60)
BKR EGFR (NON AFRICAN AMERICAN): >60 mL/min/{1.73_m2} (ref >60)
BKR GLOBULIN: 4 g/dL
BKR GLUCOSE: 131 mg/dL — ABNORMAL HIGH (ref 70–100)
BKR OSMOLALITY CALCULATION: 279 mosm/kg (ref 275–295)
BKR POTASSIUM: 3.1 mmol/L — ABNORMAL LOW (ref 3.5–5.1)
BKR PROTEIN TOTAL: 7.7 g/dL (ref 6.4–8.2)
BKR SODIUM: 138 mmol/L (ref 136–145)

## 2020-01-30 LAB — PT/INR AND PTT (BH GH L LMW YH)
BKR INR: 1.04 (ref 0.88–1.14)
BKR PARTIAL THROMBOPLASTIN TIME: 28.7 s (ref 23.0–31.0)
BKR PROTHROMBIN TIME: 11.4 s (ref 9.4–12.0)

## 2020-01-30 LAB — D-DIMER, QUANTITATIVE: BKR D-DIMER: 1.23 mg{FEU}/L — ABNORMAL HIGH (ref <0.49)

## 2020-01-30 LAB — LACTIC ACID, PLASMA (REFLEX 2H REPEAT): BKR LACTATE: 1.3 mmol/L (ref 0.4–2.0)

## 2020-01-30 LAB — C-REACTIVE PROTEIN     (CRP): BKR C-REACTIVE PROTEIN: 3.9 mg/dL — ABNORMAL HIGH (ref 0.0–1.0)

## 2020-01-30 LAB — LIPASE: BKR LIPASE: 90 U/L (ref 73–393)

## 2020-01-30 LAB — TROPONIN I     (L Q): BKR TROPONIN I: <0.015 ng/mL (ref 0.000–1.500)

## 2020-01-30 MED ORDER — MELATONIN 3 MG TABLET
3 mg | Freq: Every evening | ORAL | Status: DC | PRN
Start: 2020-01-30 — End: 2020-02-04
  Administered 2020-02-01 – 2020-02-03 (×3): 3 mg via ORAL

## 2020-01-30 MED ORDER — ACETAMINOPHEN 500 MG TABLET
500 mg | Freq: Once | ORAL | Status: CP
Start: 2020-01-30 — End: ?
  Administered 2020-01-31: 03:00:00 500 mg via ORAL

## 2020-01-30 MED ORDER — POTASSIUM 99 MG TABLET
99 mg | Freq: Two times a day (BID) | ORAL | Status: SS
Start: 2020-01-30 — End: 2020-06-20

## 2020-01-30 MED ORDER — APIXABAN 5 MG TABLET
5 mg | Freq: Two times a day (BID) | ORAL | Status: DC
Start: 2020-01-30 — End: 2020-01-31

## 2020-01-30 MED ORDER — CEFTRIAXONE IV PUSH 1000 MG VIAL & NS (ADULTS)
Freq: Once | INTRAVENOUS | Status: CP
Start: 2020-01-30 — End: ?
  Administered 2020-01-31: 03:00:00 10.000 mL via INTRAVENOUS

## 2020-01-30 MED ORDER — HYDROCHLOROTHIAZIDE 25 MG TABLET
25 mg | Freq: Every day | ORAL | Status: DC
Start: 2020-01-30 — End: 2020-02-04

## 2020-01-30 MED ORDER — ACETAMINOPHEN 325 MG TABLET
325 mg | Freq: Four times a day (QID) | ORAL | Status: DC | PRN
Start: 2020-01-30 — End: 2020-02-04
  Administered 2020-01-31 – 2020-02-03 (×7): 325 mg via ORAL

## 2020-01-30 MED ORDER — IOHEXOL 350 MG IODINE/ML INTRAVENOUS SOLUTION
350 mg iodine/mL | Freq: Once | INTRAVENOUS | Status: CP | PRN
Start: 2020-01-30 — End: ?
  Administered 2020-01-31: 01:00:00 350 mL via INTRAVENOUS

## 2020-01-30 MED ORDER — APIXABAN 5 MG TABLET
5 mg | Freq: Two times a day (BID) | ORAL | Status: DC
Start: 2020-01-30 — End: 2020-02-04
  Administered 2020-01-31 – 2020-02-03 (×8): 5 mg via ORAL

## 2020-01-30 MED ORDER — SODIUM CHLORIDE 0.9 % BOLUS (NEW BAG)
0.9 % | Freq: Once | INTRAVENOUS | Status: CP
Start: 2020-01-30 — End: ?
  Administered 2020-01-31: 03:00:00 0.9 mL/h via INTRAVENOUS

## 2020-01-30 MED ORDER — SODIUM CHLORIDE 0.9 % (FLUSH) INJECTION SYRINGE
0.9 % | INTRAVENOUS | Status: DC | PRN
Start: 2020-01-30 — End: 2020-02-04

## 2020-01-30 MED ORDER — SODIUM CHLORIDE 0.9 % IV BOLUS FROM BAG
0.9 % | INTRAVENOUS | Status: AC
Start: 2020-01-30 — End: ?

## 2020-01-30 MED ORDER — POTASSIUM CHLORIDE ER 20 MEQ TABLET,EXTENDED RELEASE(PART/CRYST)
20 MEQ | Freq: Once | ORAL | Status: CP
Start: 2020-01-30 — End: ?
  Administered 2020-01-31: 04:00:00 20 MEQ via ORAL

## 2020-01-30 MED ORDER — HYDROMORPHONE 1 MG/ML INJECTION SYRINGE
1 mg/mL | Freq: Once | INTRAVENOUS | Status: CP
Start: 2020-01-30 — End: ?
  Administered 2020-01-31: 1 mL via INTRAVENOUS

## 2020-01-30 MED ORDER — HYDROMORPHONE 4 MG TABLET
4 mg | Freq: Four times a day (QID) | ORAL | Status: DC | PRN
Start: 2020-01-30 — End: 2020-02-04
  Administered 2020-01-31 – 2020-02-03 (×11): 4 mg via ORAL

## 2020-01-30 MED ORDER — ALBUTEROL SULFATE HFA 90 MCG/ACTUATION AEROSOL INHALER
90 mcg/actuation | Freq: Four times a day (QID) | RESPIRATORY_TRACT | Status: DC | PRN
Start: 2020-01-30 — End: 2020-02-04

## 2020-01-30 MED ORDER — CHOLECALCIFEROL (VITAMIN D3) 50 MCG (2,000 UNIT) CAPSULE
50 mcg (2,000 unit) | Freq: Two times a day (BID) | ORAL | Status: AC
Start: 2020-01-30 — End: 2020-11-18

## 2020-01-30 MED ORDER — BACLOFEN 5 MG TABLET
5 mg | Freq: Two times a day (BID) | ORAL | Status: AC | PRN
Start: 2020-01-30 — End: 2021-12-28

## 2020-01-30 MED ORDER — HYDROMORPHONE 4 MG TABLET
4 mg | Freq: Four times a day (QID) | ORAL | Status: AC | PRN
Start: 2020-01-30 — End: ?

## 2020-01-30 MED ORDER — CEFTRIAXONE IV PUSH 1000 MG VIAL & NS (ADULTS)
INTRAVENOUS | Status: DC
Start: 2020-01-30 — End: 2020-01-31

## 2020-01-30 MED ORDER — PANTOPRAZOLE 40 MG TABLET,DELAYED RELEASE
40 mg | Freq: Every day | ORAL | Status: DC
Start: 2020-01-30 — End: 2020-02-04
  Administered 2020-01-31 – 2020-02-03 (×4): 40 mg via ORAL

## 2020-01-30 MED ORDER — BACLOFEN (LIORESAL) 5 MG HALFTAB
5 mg | Freq: Two times a day (BID) | ORAL | Status: DC | PRN
Start: 2020-01-30 — End: 2020-02-04
  Administered 2020-02-01 – 2020-02-02 (×2): 5 mg via ORAL

## 2020-01-30 MED ORDER — ACETAMINOPHEN 500 MG TABLET
500 mg | Status: DC
Start: 2020-01-30 — End: 2020-02-04

## 2020-01-30 MED ORDER — SODIUM CHLORIDE 0.9 % (FLUSH) INJECTION SYRINGE
0.9 % | Freq: Three times a day (TID) | INTRAVENOUS | Status: DC
Start: 2020-01-30 — End: 2020-02-04
  Administered 2020-01-31 – 2020-02-03 (×5): 0.9 mL via INTRAVENOUS

## 2020-01-30 MED ORDER — APIXABAN 5 MG TABLET
5 mg | Freq: Two times a day (BID) | ORAL | Status: DC
Start: 2020-01-30 — End: 2020-02-04

## 2020-01-30 NOTE — Other
OCCUPATIONAL THERAPY - LONG RIDGEOccupational Therapy - [place] [facility] [place] [address]Occupational Therapy - Hand TherapyDaily Note3/4/2021Patient Name:  Henry Dunlap HelgesonMedical Record Number:  [id number] Date of Birth:  [birth year]Therapist:  [facility], OTR/L, CHTReferring Provider:  Margie Ege, MDICD-10 Diagnosis(es):Problem List         ICD-10-CM   s/p dupuytren's fasciotomy R Thumb and Little Finger  * (Principal) Dupuytren's contracture of right hand M72.0  General InformationTreatment Number:???#5??Date the Treatment Plan was Initiated/Reviewed:??01/06/2020??Start of Care Date:???01/06/2020??Onset of Illness/Injury Date:???12/02/2018??Date of Surgery:???12/19/2019??Progress Report Due Date:???02/20/2020??MD Order Renewal Date:???3/25/2021Medication Review:Current Outpatient Medications Medication Sig ? acetaminophen Take 500 mg by mouth every 6 (six) hours as needed (Pain/Headache). ? albuterol sulfate Inhale 1-2 puffs into the lungs. ? ascorbic acid (vitamin C) Take 1,000 mg by mouth. ? atorvastatin TAKE 1 TABLET BY MOUTH EVERY DAY ? budesonide-formoteroL Inhale 2 puffs into the lungs 2 (two) times daily. (Patient not taking: Reported on 07/30/2019) ? buprenorphine HCL DIS 1 T UNT BID FOR 7 DAYS ? DULoxetine TK 1 C PO QD ? Eliquis TAKE 1 TABLET(5 MG) BY MOUTH TWICE DAILY ? hydroCHLOROthiazide Take 1 tablet (25 mg total) by mouth daily. ? HYDROmorphone 1 tab q 4h prn pain ? lidocaine Place 1 patch onto the skin daily Remove & Discard patch within 12 hours or as directed by MD ? meclizine Take 25 mg by mouth 3 (three) times daily. ? melatonin Take 1 to 2 tablets (5mg  to 10mg ) by mouth nightly as needed for Sleep. ? metoprolol succinate XL TK 1 T PO DAILY ? pantoprazole Take 1 tablet (40 mg total) by mouth daily. ? polyethylene glycol Take 1 packet (17 g total) by mouth daily. Mix in 8 ounces of water, juice, soda, coffee or tea prior to taking. (Patient not taking: Reported on 07/30/2019) ? pregabalin Take 1 capsule (50 mg total) by mouth 2 (two) times daily. ? senna Take 1 tablet by mouth 2 (two) times daily. ? varenicline Take 1 tablet (1mg ) by mouth 2 (two) times daily as directed. 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 Continue to wear night splintPlanFrequency:  2x per weekPlan for Next Visit; PROM, scar massage, functional activities, han dtool management

## 2020-01-31 LAB — BASIC METABOLIC PANEL
BKR ANION GAP: 10 (ref 5–18)
BKR BLOOD UREA NITROGEN: 14 mg/dL (ref 8–25)
BKR BUN / CREAT RATIO: 15.6 (ref 8.0–25.0)
BKR CALCIUM: 8.7 mg/dL (ref 8.4–10.3)
BKR CHLORIDE: 103 mmol/L (ref 95–115)
BKR CO2: 27 mmol/L (ref 21–32)
BKR CREATININE: 0.9 mg/dL (ref 0.50–1.30)
BKR EGFR (AFR AMER): >60 mL/min/{1.73_m2} (ref >60)
BKR EGFR (NON AFRICAN AMERICAN): >60 mL/min/{1.73_m2} (ref >60)
BKR GLUCOSE: 118 mg/dL — ABNORMAL HIGH (ref 70–100)
BKR OSMOLALITY CALCULATION: 281 mosm/kg (ref 275–295)
BKR POTASSIUM: 3.1 mmol/L — ABNORMAL LOW (ref 3.5–5.1)
BKR SODIUM: 140 mmol/L (ref 136–145)

## 2020-01-31 LAB — CBC WITH AUTO DIFFERENTIAL
BKR WAM ABSOLUTE IMMATURE GRANULOCYTES: 0 x 1000/ÂµL (ref 0.0–0.2)
BKR WAM ABSOLUTE LYMPHOCYTE COUNT: 0.6 x 1000/ÂµL — ABNORMAL LOW (ref 1.0–2.3)
BKR WAM ABSOLUTE NRBC: 0 x 1000/ÂµL (ref <=0.0)
BKR WAM ANALYZER ANC: 8.2 x 1000/ÂµL — ABNORMAL HIGH (ref 1.8–7.3)
BKR WAM BASOPHIL ABSOLUTE COUNT: 0 x 1000/ÂµL (ref 0.0–0.6)
BKR WAM BASOPHILS: 0.2 % (ref 0.0–2.0)
BKR WAM EOSINOPHIL ABSOLUTE COUNT: 0 x 1000/ÂµL (ref 0.0–0.4)
BKR WAM EOSINOPHILS: 0.1 % (ref 0.0–6.0)
BKR WAM HEMATOCRIT: 44.3 % (ref 41.0–54.0)
BKR WAM HEMOGLOBIN: 14.5 g/dL (ref 13.7–18.0)
BKR WAM IMMATURE GRANULOCYTES: 0.4 % (ref 0.0–2.0)
BKR WAM LYMPHOCYTES: 6.8 % — ABNORMAL LOW (ref 14.0–43.0)
BKR WAM MCH (PG): 29.9 pg (ref 25.7–31.0)
BKR WAM MCHC: 32.7 g/dL (ref 32.0–36.0)
BKR WAM MCV: 91.3 fL (ref 80.0–99.0)
BKR WAM MONOCYTE ABSOLUTE COUNT: 0.4 x 1000/ÂµL (ref 0.4–1.3)
BKR WAM MONOCYTES: 4.1 % (ref 0.0–14.0)
BKR WAM MPV: 9.9 fL (ref 9.8–12.3)
BKR WAM NEUTROPHILS: 88.4 % — ABNORMAL HIGH (ref 38.0–74.0)
BKR WAM NUCLEATED RED BLOOD CELLS: 0 % (ref 0.0–0.2)
BKR WAM PLATELETS: 234 x1000/ÂµL (ref 140–446)
BKR WAM RDW-CV: 13.5 % (ref 11.5–14.5)
BKR WAM RED BLOOD CELL COUNT: 4.9 M/ÂµL (ref 4.6–6.1)
BKR WAM WHITE BLOOD CELL COUNT: 9.3 x1000/ÂµL (ref 3.8–10.6)

## 2020-01-31 LAB — MAGNESIUM: BKR MAGNESIUM: 2.1 mg/dL (ref 1.4–2.2)

## 2020-01-31 LAB — ZZZURINE DRUG SCREEN      (GH)
BKR AMPHETAMINE SCREEN, URINE: NEGATIVE
BKR BARBITURATE SCREEN, URINE: NEGATIVE
BKR BENZODIAZEPINE SCREEN, URINE: NEGATIVE
BKR CANNABINOID SCREEN, URINE: NEGATIVE
BKR COCAINE SCREEN, URINE: NEGATIVE
BKR ETHANOL URINE: NEGATIVE
BKR METHADONE SCREEN, URINE: NEGATIVE
BKR OPIATE SCREEN, URINE: POSITIVE — AB
BKR PHENCYCLIDINE SCREEN, URINE: NEGATIVE

## 2020-01-31 LAB — URINE MICROSCOPIC     (BH GH LMW YH)
BKR HYALINE CASTS, UA INSTRUMENT (NUMERIC): 22 /LPF — ABNORMAL HIGH (ref 0–3)
BKR RBC/HPF INSTRUMENT: 2 /HPF (ref 0–2)
BKR WBC/HPF INSTRUMENT: 51 /HPF — ABNORMAL HIGH (ref 0–5)

## 2020-01-31 LAB — EXTRA BLUE TOP SPECIMEN     (BH GH LMW)

## 2020-01-31 LAB — PHOSPHORUS     (BH GH L LMW YH): BKR PHOSPHORUS: 3.8 mg/dL (ref 2.5–4.9)

## 2020-01-31 LAB — SARS COV-2 (COVID-19) RNA: BKR SARS-COV-2 RNA (COVID-19) (YH): NOT DETECTED

## 2020-01-31 MED ORDER — CIPROFLOXACIN 400 MG/200 ML IN 5 % DEXTROSE INTRAVENOUS PIGGYBACK
400200 mg/200 mL | Freq: Two times a day (BID) | INTRAVENOUS | Status: DC
Start: 2020-01-31 — End: 2020-02-01
  Administered 2020-01-31 – 2020-02-01 (×2): 400 mL/h via INTRAVENOUS

## 2020-01-31 MED ORDER — ONDANSETRON HCL (PF) 4 MG/2 ML INJECTION SOLUTION
4 mg/2 mL | Freq: Once | INTRAVENOUS | Status: CP
Start: 2020-01-31 — End: ?
  Administered 2020-01-31: 07:00:00 4 mL via INTRAVENOUS

## 2020-01-31 MED ORDER — NICOTINE 7 MG/24 HR DAILY TRANSDERMAL PATCH
7 mg | Freq: Every day | TRANSDERMAL | Status: DC
Start: 2020-01-31 — End: 2020-02-04

## 2020-01-31 MED ORDER — POTASSIUM CHLORIDE ER 20 MEQ TABLET,EXTENDED RELEASE(PART/CRYST)
20 MEQ | Freq: Once | ORAL | Status: CP
Start: 2020-01-31 — End: ?
  Administered 2020-01-31: 22:00:00 20 MEQ via ORAL
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
## 2020-01-31 NOTE — Other
PHARMACY-ASSISTED MEDICATION REPORTPharmacist review of the best possible medication history obtained by the pharmacy medication history technician has been performed.  I have updated the home medication list and identified the following information that may be relevant to this admission. Recommendations:  Please address patient adherence to HCTZ and continue if clinically appropriate. Patient reports this is a current home medication; however, pharmacy records indicate this was last filled in Sept 2020 for 90-day supply.  Notes:N/a Home medication list updated and reviewed. Medication history completed through interview with patient and review of pharmacy records. Current Medications Medication Sig Note  albuterol sulfate 90 mcg/actuation HFA aerosol inhaler Inhale 2 puffs into the lungs every 6 (six) hours as needed for wheezing.  01/30/2020: Pharm Tech (CM): Rescue inhaler was last filled 08/08/17. It may be expired.   ascorbic acid, vitamin C, (VITAMIN C) 1000 mg tablet Take 1,000 mg by mouth daily.    baclofen (LIORESAL) 5 mg tablet Take 5 mg by mouth 2 (two) times daily as needed.   CHOLECALCIFEROL, VITAMIN D3, ORAL Take 1 capsule by mouth daily.   hydroCHLOROthiazide (HYDRODIURIL) 25 mg tablet Take 1 tablet (25 mg total) by mouth daily. 01/30/2020: Pharm Tech (CM): Patient reports as a current home medication, however, last filled 08/19/19 for 90-day supply  HYDROmorphone (DILAUDID) 4 mg tablet Take 4 mg by mouth every 6 (six) hours as needed (Pain).   melatonin 5 mg tablet Take 5-10 mg by mouth nightly as needed (Sleep).    pantoprazole (PROTONIX) 40 mg tablet Take 1 tablet (40 mg total) by mouth daily.   potassium 99 mg Tab Take 99 mg by mouth daily.   varenicline (CHANTIX CONTINUING MONTH BOX) 1 mg tablet Take 1 mg by mouth daily. Take as directed.  [name] Myrene Galas, PharmD10:06 PM3/4/2021Phone: Available via [employer] ([facility])

## 2020-01-31 NOTE — ED Notes
12:12 AM SBAR HandoffSituation:	Admitting Diagnosis: The primary encounter diagnosis was Urinary tract infection with hematuria, site unspecified. A diagnosis of Fever and chills was also pertinent to this visit.Background:  Chief Complaint Patient presents with ? Fever-9 Weeks To 74 Years   STATES THIS AM DEVELOPED COUGH AND FEVER.  SYMPTOMS REMAIN AN D NOW HAS DIFUSE ABD PAIN.  DENIES ANY N/V/D    Isolation status: Not applicableAllergies:Allergies as of 01/30/2020 - Review Complete 01/30/2020 Allergen Reaction Noted ? Percocet [oxycodone-acetaminophen] Other (See Comments) 02/28/2013 ? Duoneb [ipratropium-albuterol]  10/11/2018 Code status: FULL CODEAssessment:Vital signs:	Vitals:  01/30/20 1729 01/30/20 1938 01/30/20 2106 01/30/20 2323 BP: 138/84 136/82  128/82 Pulse: (!) 129 (!) 100  (!) 97 Resp: 20 18   Temp: 98.8 ?F (37.1 ?C)  98.8 ?F (37.1 ?C)  TempSrc: Oral  Oral  SpO2: 94% 95% 98% 97% Medications ordered and administered in the Emergency Department:	Medications sodium chloride 0.9 % flush 3 mL (3 mLs IV Push Given 01/30/20 2304) sodium chloride 0.9 % flush 3 mL (has no administration in time range) acetaminophen (TYLENOL) tablet 650 mg (has no administration in time range) cefTRIAXone (ROCEPHIN) 1 g in sodium chloride 0.9% PF 10 mL (100 mg/mL) (has no administration in time range) sodium chloride 0.9 % bolus from bag 1,500 mL (has no administration in time range) apixaban (ELIQUIS) tablet 10 mg (has no administration in time range)   Followed by apixaban (ELIQUIS) tablet 5 mg (has no administration in time range) hydroCHLOROthiazide (HYDRODIURIL) tablet 25 mg ( Oral Automatically Held 02/03/20 0900) pantoprazole (PROTONIX) EC tablet 40 mg (has no administration in time range) albuterol sulfate 90 mcg/actuation HFA aerosol inhaler 2 puff (has no administration in time range) baclofen (LIORESAL) tablet 5 mg (has no administration in time range) HYDROmorphone (DILAUDID) tablet 4 mg (has no administration in time range) melatonin tablet 3 mg (has no administration in time range) HYDROmorphone (DILAUDID) injection 1 mg (1 mg IV Push Given 01/30/20 1908) iohexoL (OMNIPAQUE) 350 mg iodine/mL injection 110 mL (110 mLs Intravenous Given 01/30/20 2020) cefTRIAXone (ROCEPHIN) 1 g in sodium chloride 0.9% PF 10 mL (100 mg/mL) (1 g Intravenous Given 01/30/20 2145) acetaminophen (TYLENOL) tablet 1,000 mg (1,000 mg Oral Given 01/30/20 2145) sodium chloride 0.9 % (new bag) bolus 1,000 mL (1,000 mLs Intravenous New Bag 01/30/20 2146) potassium chloride SA (K-DUR,KLOR-CON M) 24 hr tablet 40 mEq (40 mEq Oral Given 01/30/20 2300) Labs ordered and resulted in the Emergency Department.	Results for orders placed or performed during the hospital encounter of 01/30/20 Blood culture  Specimen: Peripheral; Blood Result Value Ref Range  Blood Culture No Growth to Date  Blood culture  Specimen: Peripheral; Blood Result Value Ref Range  Blood Culture No Growth to Date  SARS CoV-2 (COVID-19) RNA [facility] Labs for patients being admitted  Specimen: Nasopharynx; Viral Result Value Ref Range  SARS-CoV-2 RNA (COVID-19) Not Detected Not Detected Lactic acid, plasma (sepsis, reflex 2h repeat) Result Value Ref Range  Lactate 1.3 0.4 - 2.0 mmol/L Urinalysis with culture reflex  Specimen: Urine Result Value Ref Range  Clarity, UA Clear Clear  Color, UA Yellow Yellow  Specific Gravity, UA 1.021 1.005 - 1.030  pH, UA 5.5 5.5 - 7.5  Protein, UA Trace Negative-Trace  Glucose, UA Negative Negative  Ketones, UA Negative Negative  Blood, UA 1+ (A) Negative  Bilirubin, UA Negative Negative  Leukocytes, UA Positive (A) Negative  Nitrite, UA Positive (A) Negative  Urobilinogen, UA <2.0 <=2.0 EU/dL CBC auto differential Result Value Ref Range  WBC 13.3 (  H) 3.8 - 10.6 x1000/?L  RBC 5.1 4.6 - 6.1 M/?L  Hemoglobin 15.3 13.7 - 18.0 g/dL  Hematocrit 16.1 09.6 - 54.0 %  MCV 90.3 80.0 - 99.0 fL  MCHC 33.6 32.0 - 36.0 g/dL  RDW-CV 04.5 40.9 - 81.1 %  Platelets 301 140 - 446 x1000/?L  MPV 10.2 9.8 - 12.3 fL  ANC (Abs Neutrophil Count) 11.4 (H) 1.8 - 7.3 x 1000/?L  Neutrophils 85.8 (H) 38.0 - 74.0 %  Lymphocytes 6.6 (L) 14.0 - 43.0 %  Absolute Lymphocyte Count 0.9 (L) 1.0 - 2.3 x 1000/?L  Monocytes 4.7 0.0 - 14.0 %  Monocyte Absolute Count 0.6 0.4 - 1.3 x 1000/?L  Eosinophils 2.0 0.0 - 6.0 %  Eosinophil Absolute Count 0.3 0.0 - 0.4 x 1000/?L  Basophil 0.4 0.0 - 2.0 %  Basophil Absolute Count 0.1 0.0 - 0.6 x 1000/?L  Immature Granulocytes 0.5 0.0 - 2.0 %  Absolute Immature Granulocyte Count 0.1 0.0 - 0.2 x 1000/?L  nRBC 0.0 0.0 - 0.2 %  Absolute nRBC 0.0 <=0.0 x 1000/?L  MCH 30.3 25.7 - 31.0 pg Comprehensive metabolic panel Result Value Ref Range  Sodium 138 136 - 145 mmol/L  Potassium 3.1 (L) 3.5 - 5.1 mmol/L  Chloride 102 95 - 115 mmol/L  CO2 26 21 - 32 mmol/L  Anion Gap 10 5 - 18  Glucose 131 (H) 70 - 100 mg/dL  BUN 17 8 - 25 mg/dL  Creatinine 9.14 7.82 - 1.30 mg/dL  Calcium 9.2 8.4 - 95.6 mg/dL  BUN/Creatinine Ratio 21.3 8.0 - 25.0  Total Protein 7.7 6.4 - 8.2 g/dL  Albumin 3.7 3.4 - 5.0 g/dL  Total Bilirubin 0.9 0.0 - 1.0 mg/dL  Alkaline Phosphatase 086 (H) 20 - 135 U/L  Alanine Aminotransferase (ALT) 59 12 - 78 U/L  Aspartate Aminotransferase (AST) 23 5 - 37 U/L  Globulin 4.0 g/dL  A/G Ratio 0.9   AST/ALT Ratio 0.4   eGFR (Afr Amer) >60 >60 mL/min/1.35m2  eGFR (NON African-American) >60 >60 mL/min/1.34m2  Osmolality Calculation 279 275 - 295 mOsm/kg PT/INR and PTT Result Value Ref Range  Prothrombin Time 11.4 9.4 - 12.0 seconds  INR 1.04 0.88 - 1.14  PTT 28.7 23.0 - 31.0 seconds Troponin I Result Value Ref Range  Troponin I <0.015 0.000 - 1.500 ng/mL C-reactive protein Result Value Ref Range C-Reactive Protein 3.9 (H) 0.0 - 1.0 mg/dL Lipase Result Value Ref Range  Lipase 90 73 - 393 U/L D-dimer, quantitative Result Value Ref Range  D-Dimer 1.23 (H) <0.49 mg/L FEU Urine microscopic     (BH GH LMW YH) Result Value Ref Range  Epithelial Cells Few None-Few /LPF  Hyaline Casts, UA 22 (H) 0 - 3 /LPF  Bacteria, UA Many (A) None-Few /HPF  WBC/HPF, UA 51 (H) 0 - 5 /HPF  RBC/HPF, UA 2 0 - 2 /HPF Urine drug screen Result Value Ref Range  Amphetamine Screen, Urine Negative Negative  Barbiturate Screen, Urine Negative Negative  Benzodiazepine Screen, Urine Negative Negative  Cocaine Screen, Urine Negative Negative  Methadone Screen, Urine Negative Negative  Opiate Screen, Urine Positive (A) Negative  Alcohol Urine Negative Negative  Phencyclidine Screen, Urine Negative Negative  Cannabinoid Screen, Urine Negative Negative EKG Result Value Ref Range  Heart Rate 123 bpm  QRS Duration 88 ms  Q-T Interval 320 ms  QTC Calculation(Bezet) 458 ms  P Axis 39 deg  R Axis 6 deg  T Axis 27 deg  P-R Interval 152 msec  ECG -  SEVERITY Abnormal ECG severity Radiology studies done in ER: 	[place] Head wo IV Contrast Final Result IMPRESSION: No acute intracranial abnormality.  Reported and Signed by:  Azzie Glatter, MD  [place] Abdomen Pelvis w IV Contrast (No Oral) Final Result IMPRESSION:  1. No new pulmonary embolus. 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  2. Increased reticulation in the left infrahilar region, possibly infectious/inflammatory in nature. Recommend follow-up to resolution. Prominent mediastinal adenopathy may be reactive, and can be followed on subsequent [place].  3. Mildly dilated and fluid-filled esophagus raising concern for reflux.  4. Diverticulosis without evidence of diverticulitis.  Reported and Signed by:  Azzie Glatter, MD  CTA Chest (PE) w IV Contrast Final Result IMPRESSION:  1. No new pulmonary embolus. 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  2. Increased reticulation in the left infrahilar region, possibly infectious/inflammatory in nature. Recommend follow-up to resolution. Prominent mediastinal adenopathy may be reactive, and can be followed on subsequent [place].  3. Mildly dilated and fluid-filled esophagus raising concern for reflux.  4. Diverticulosis without evidence of diverticulitis.  Reported and Signed by:  Azzie Glatter, MD  US Abdomen Limited ([place] Gall Bladder) Final Result Impression:  No evidence of cholelithiasis or cholecystitis.  Hepatic steatosis.  Reported and Signed by:  Azzie Glatter, MD  XR Chest PA or AP Final Result IMPRESSION: No acute cardiopulmonary abnormality.  Reported and Signed by:  Azzie Glatter, MD  CARDIAC EKG RESULT SCAN Final Result  IV access:	 forearm left, condition patent, no redness and DDI		 {Mental status: Appropriate, Normal and CooperativeFunctional Status: IndependentCurrent pain level: 0Dysphagia screen performed: NoIs patient a fall risk: NoIs patient on oxygen: YesSitter ordered/needed? NoRecommendations:		Outstanding tests: Yes	Consults: Yes	Pending labs/meds/blood products: Yes	Brief plan/summary:

## 2020-01-31 NOTE — Plan of Care
Plan of Care Overview/ Patient Status    Transfer Note Nursing [name] is a 69 y.o. male transferred from ED with a chief complaint of fever at home, cough and chest pain. Assisted in to bed. AxO x4. On 2L NC, saturating appropriately. Hep-locked, 1500 mL NS bolus started in ED completed. Denies SOB or chest pain. Apical pulse irregular, episodes of tachycardia. Present before admission, Dr. Elby Showers aware. Lungs . Abdomen distended and non-tender, Dr. Elby Showers aware. (+) BS x4 quadrants. (+) flatus. Pt has a history of BPH, order to straight cath himself in place. Last BM before admission. OOB stand-by-assist, contact-guard. Very unsteady. 0209: Pt c/o nausea. Dr. Elby Showers notified. One time dose of Zofran 4 mg IV push ordered. Pt tolerated well. OOB to bathroom to straight cath himself, as ordered. Refusing to measure urine output; Dr. Elby Showers aware. Very agitated. Victorino Dike, Press photographer, notified. Pt transferred to room closer to nurse station. 0309: Eliquis 10 mg PO cleared by pharmacy and given as ordered. 0320: Pt c/o pain to abdomen and back. Tylenol 650 mg PO and Dilaudid 4 mg PO given as ordered by Dr. Elby Showers. Tolerated well. Adequate pain relief achieved. Pt resting in bed. 0558: Vital signs stable except for HR; HR as high as 110s (tachycardia present at admission). Pt resting comfortably in bed throughout the night. Fall precautions maintained. Safety factors maintained: call bell/essentials within reach. Bed locked in lowest position. Bed alarm on. Hourly safety rounding performed. Vitals:  01/30/20 1938 01/30/20 2106 01/30/20 2323 01/31/20 0047 BP: 136/82  128/82 (!) 135/90 Pulse: (!) 100  (!) 97 (!) 102 Resp: 18   18 Temp:  98.8 ?F (37.1 ?C)  98.1 ?F (36.7 ?C) TempSrc:  Oral  Oral SpO2: 95% 98% 97% 98% See flowsheets, patient education and plan of care for additional information. Problem: Adult Inpatient Plan of CareGoal: Plan of Care ReviewOutcome: Interventions implemented as appropriateGoal: Patient-Specific Goal (Individualized)Outcome: Interventions implemented as appropriateGoal: Absence of Hospital-Acquired Illness or InjuryOutcome: Interventions implemented as appropriateGoal: Optimal Comfort and WellbeingOutcome: Interventions implemented as appropriateGoal: Readiness for Transition of CareOutcome: Interventions implemented as appropriate Problem: UTI (Urinary Tract Infection)Goal: Improved Infection SymptomsOutcome: Interventions implemented as appropriate
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
## 2020-01-31 NOTE — Other
I  discussed his management with PA O'Brien.  I agree with the history, physical, assessment, and plan of care, with the following exceptions: NoneI was present for the following procedures: NoneTime Spent in Critical Care of the patient: NoneTime spent in discussions with the patient and family: Laury Axon, MD Madie Reno, MD03/04/21 2321

## 2020-01-31 NOTE — Plan of Care
Plan of Care Overview/ Patient Status    0700-1900: Pt A/O x4, VSS. 89% ORA, placed on 2L NC, sat'ing 93-95%, denies SOB. Tachy to 110's. Given PRN Tylenol x1 for headache and Dilaudid x1 w/moderate effect. Ice to back of head. OOB ind in room, unsteady gait, self catheterized x2 as reported by pt, no BM. Decent PO intake with meals. Given PO K+. Started on IV Cipro. Refused nicotine patch in AM. Safety maintained, call bell in reach, frequent rounding.Problem: Adult Inpatient Plan of CareGoal: Plan of Care ReviewOutcome: Interventions implemented as appropriateGoal: Patient-Specific Goal (Individualized)Outcome: Interventions implemented as appropriateGoal: Absence of Hospital-Acquired Illness or InjuryOutcome: Interventions implemented as appropriateGoal: Optimal Comfort and WellbeingOutcome: Interventions implemented as appropriateGoal: Readiness for Transition of CareOutcome: Interventions implemented as appropriate Problem: UTI (Urinary Tract Infection)Goal: Improved Infection SymptomsOutcome: Interventions implemented as appropriate Problem: Fall Injury RiskGoal: Absence of Fall and Fall-Related InjuryOutcome: Interventions implemented as appropriate
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
## 2020-02-01 LAB — BASIC METABOLIC PANEL
BKR ANION GAP: 7 (ref 5–18)
BKR BLOOD UREA NITROGEN: 15 mg/dL (ref 8–25)
BKR BUN / CREAT RATIO: 17.4 (ref 8.0–25.0)
BKR CALCIUM: 8.7 mg/dL (ref 8.4–10.3)
BKR CHLORIDE: 104 mmol/L (ref 95–115)
BKR CO2: 30 mmol/L (ref 21–32)
BKR CREATININE: 0.86 mg/dL (ref 0.50–1.30)
BKR EGFR (AFR AMER): >60 mL/min/{1.73_m2} (ref >60)
BKR EGFR (NON AFRICAN AMERICAN): >60 mL/min/{1.73_m2} (ref >60)
BKR GLUCOSE: 121 mg/dL — ABNORMAL HIGH (ref 70–100)
BKR OSMOLALITY CALCULATION: 283 mosm/kg (ref 275–295)
BKR POTASSIUM: 3.4 mmol/L — ABNORMAL LOW (ref 3.5–5.1)
BKR SODIUM: 141 mmol/L (ref 136–145)

## 2020-02-01 LAB — URINE CULTURE: BKR URINE CULTURE, ROUTINE: >=100000 — AB

## 2020-02-01 LAB — CBC WITHOUT DIFFERENTIAL
BKR WAM ANALYZER ANC: 5.9 x 1000/ÂµL (ref 1.8–7.3)
BKR WAM HEMATOCRIT: 42.1 % (ref 41.0–54.0)
BKR WAM HEMOGLOBIN: 14 g/dL (ref 13.7–18.0)
BKR WAM MCH (PG): 30.6 pg (ref 25.7–31.0)
BKR WAM MCHC: 33.3 g/dL (ref 32.0–36.0)
BKR WAM MCV: 91.9 fL (ref 80.0–99.0)
BKR WAM MPV: 10.2 fL (ref 9.8–12.3)
BKR WAM PLATELETS: 218 x1000/ÂµL (ref 140–446)
BKR WAM RDW-CV: 13.5 % (ref 11.5–14.5)
BKR WAM RED BLOOD CELL COUNT: 4.6 M/ÂµL (ref 4.6–6.1)
BKR WAM WHITE BLOOD CELL COUNT: 8.1 x1000/ÂµL (ref 3.8–10.6)

## 2020-02-01 MED ORDER — POTASSIUM CHLORIDE ER 20 MEQ TABLET,EXTENDED RELEASE(PART/CRYST)
20 MEQ | Freq: Once | ORAL | Status: CP
Start: 2020-02-01 — End: ?
  Administered 2020-02-01: 17:00:00 20 MEQ via ORAL

## 2020-02-01 MED ORDER — CIPROFLOXACIN 500 MG TABLET
500 mg | Freq: Two times a day (BID) | ORAL | Status: DC
Start: 2020-02-01 — End: 2020-02-04
  Administered 2020-02-02 – 2020-02-03 (×4): 500 mg via ORAL

## 2020-02-01 NOTE — Plan of Care
Problem: Adult Inpatient Plan of CareGoal: Plan of Care ReviewOutcome: Interventions implemented as appropriateGoal: Patient-Specific Goal (Individualized)Outcome: Interventions implemented as appropriateGoal: Absence of Hospital-Acquired Illness or InjuryOutcome: Interventions implemented as appropriateGoal: Optimal Comfort and WellbeingOutcome: Interventions implemented as appropriateGoal: Readiness for Transition of CareOutcome: Interventions implemented as appropriate Problem: UTI (Urinary Tract Infection)Goal: Improved Infection SymptomsOutcome: Interventions implemented as appropriate Problem: Fall Injury RiskGoal: Absence of Fall and Fall-Related InjuryOutcome: Interventions implemented as appropriate Plan of Care Overview/ Patient Status    [name] was received in bed. Alert and oriented x4. Vss. Afebrile. spox 93-95% on 2l via nc. Denied chest pain or sob. HR 78-95. Pt was medicated with tylenol 650mg  po and dilaudid 4mg  po for headache with mod pain relief. Hep lock in place. oob independently in the room. Steady gait noted. Self cath x2 at bedtime. Continue on Cipro IV for UTI. No adverse reaction. Hourly rounding and safety maintained. Ice pack to head applied several times.  Bed alarm. Will continue to monitor.
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
## 2020-02-01 NOTE — Utilization Review (ED)
UM Status: UR/MD agree on IP status

## 2020-02-01 NOTE — Plan of Care
Plan of Care Overview/ Patient Status    0730. Pt received lying in bed on room air A+Ox4. Lungs sounds are audible and clear b/l. Pt abdomen is soft, extra large and round,  non-tender. +BS in all 4 quadrants. Pt self caths in bathroom independently. Pt denies any pain at this time. Call bell within reach. Frequent rounding. 0840. Pt sitting up in bed eating breakfast. Pt moves independently around room and to bathroom. 1130. Pt given dilaudid, pain of 4 on scale of 0-10, stating he doesn't want the pain to shoot up to a 10. 1440. Pt watching TV no complaints at this time. 1600. Pt stated he straight cathed about 4-5 times today, he stated his out put was a lot. asked the pt to state how much, stated not sure looked like a lot.1740. Pt reports having 'crazy' amount of pain and a headache. Pain meds admin.Pt eating dinner w/out n/v.

## 2020-02-02 LAB — BASIC METABOLIC PANEL
BKR ANION GAP: 6 (ref 5–18)
BKR BLOOD UREA NITROGEN: 14 mg/dL (ref 8–25)
BKR BUN / CREAT RATIO: 15.4 (ref 8.0–25.0)
BKR CALCIUM: 9 mg/dL (ref 8.4–10.3)
BKR CHLORIDE: 107 mmol/L (ref 95–115)
BKR CO2: 27 mmol/L (ref 21–32)
BKR CREATININE: 0.91 mg/dL (ref 0.50–1.30)
BKR EGFR (AFR AMER): >60 mL/min/{1.73_m2} (ref >60)
BKR EGFR (NON AFRICAN AMERICAN): >60 mL/min/{1.73_m2} (ref >60)
BKR GLUCOSE: 115 mg/dL — ABNORMAL HIGH (ref 70–100)
BKR OSMOLALITY CALCULATION: 281 mosm/kg (ref 275–295)
BKR POTASSIUM: 3.6 mmol/L (ref 3.5–5.1)
BKR SODIUM: 140 mmol/L (ref 136–145)

## 2020-02-02 LAB — CBC WITHOUT DIFFERENTIAL
BKR WAM ANALYZER ANC: 3.7 x 1000/ÂµL (ref 1.8–7.3)
BKR WAM HEMATOCRIT: 46.8 % (ref 41.0–54.0)
BKR WAM HEMOGLOBIN: 15.2 g/dL (ref 13.7–18.0)
BKR WAM MCH (PG): 30.2 pg (ref 25.7–31.0)
BKR WAM MCHC: 32.5 g/dL (ref 32.0–36.0)
BKR WAM MCV: 92.9 fL (ref 80.0–99.0)
BKR WAM MPV: 9.9 fL (ref 9.8–12.3)
BKR WAM PLATELETS: 252 x1000/ÂµL (ref 140–446)
BKR WAM RDW-CV: 13.4 % (ref 11.5–14.5)
BKR WAM RED BLOOD CELL COUNT: 5 M/ÂµL (ref 4.6–6.1)
BKR WAM WHITE BLOOD CELL COUNT: 6.5 x1000/ÂµL (ref 3.8–10.6)

## 2020-02-02 NOTE — Plan of Care
Problem: Adult Inpatient Plan of CareGoal: Plan of Care ReviewOutcome: Interventions implemented as appropriateGoal: Patient-Specific Goal (Individualized)Outcome: Interventions implemented as appropriateGoal: Absence of Hospital-Acquired Illness or InjuryOutcome: Interventions implemented as appropriateGoal: Optimal Comfort and WellbeingOutcome: Interventions implemented as appropriateGoal: Readiness for Transition of CareOutcome: Interventions implemented as appropriate Problem: UTI (Urinary Tract Infection)Goal: Improved Infection SymptomsOutcome: Interventions implemented as appropriate Problem: Fall Injury RiskGoal: Absence of Fall and Fall-Related InjuryOutcome: Interventions implemented as appropriate Plan of Care Overview/ Patient Status    [name] was received in bed. Alert and oriented and anxious at times. Vss. Afebrile. Abdomen softly distended with +bs, no bm today. Offered prune juice. Pt refused it.  Ad lib in the room. Continue SC himself x1 last night. Tol well. Cipro po in progress for UTI. No adverse reaction. Will continue to monitor. Good appetite and good po intake. Hourly rounding and safety maintained.
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
## 2020-02-03 ENCOUNTER — Inpatient Hospital Stay: Admit: 2020-02-03 | Discharge: 2020-02-03 | Payer: MEDICARE | Primary: Family Medicine

## 2020-02-03 ENCOUNTER — Inpatient Hospital Stay: Admit: 2020-02-03 | Payer: PRIVATE HEALTH INSURANCE

## 2020-02-03 DIAGNOSIS — Z8711 Personal history of peptic ulcer disease: Secondary | ICD-10-CM

## 2020-02-03 DIAGNOSIS — Z981 Arthrodesis status: Secondary | ICD-10-CM

## 2020-02-03 DIAGNOSIS — Z86711 Personal history of pulmonary embolism: Secondary | ICD-10-CM

## 2020-02-03 DIAGNOSIS — E7800 Pure hypercholesterolemia, unspecified: Secondary | ICD-10-CM

## 2020-02-03 DIAGNOSIS — N390 Urinary tract infection, site not specified: Secondary | ICD-10-CM

## 2020-02-03 DIAGNOSIS — Z87891 Personal history of nicotine dependence: Secondary | ICD-10-CM

## 2020-02-03 DIAGNOSIS — E876 Hypokalemia: Secondary | ICD-10-CM

## 2020-02-03 DIAGNOSIS — Z79899 Other long term (current) drug therapy: Secondary | ICD-10-CM

## 2020-02-03 DIAGNOSIS — I10 Essential (primary) hypertension: Secondary | ICD-10-CM

## 2020-02-03 DIAGNOSIS — Z79891 Long term (current) use of opiate analgesic: Secondary | ICD-10-CM

## 2020-02-03 DIAGNOSIS — G8929 Other chronic pain: Secondary | ICD-10-CM

## 2020-02-03 DIAGNOSIS — R109 Unspecified abdominal pain: Secondary | ICD-10-CM

## 2020-02-03 DIAGNOSIS — Z885 Allergy status to narcotic agent status: Secondary | ICD-10-CM

## 2020-02-03 DIAGNOSIS — T83511A Infection and inflammatory reaction due to indwelling urethral catheter, initial encounter: Secondary | ICD-10-CM

## 2020-02-03 DIAGNOSIS — Z888 Allergy status to other drugs, medicaments and biological substances status: Secondary | ICD-10-CM

## 2020-02-03 DIAGNOSIS — K219 Gastro-esophageal reflux disease without esophagitis: Secondary | ICD-10-CM

## 2020-02-03 DIAGNOSIS — Z85828 Personal history of other malignant neoplasm of skin: Secondary | ICD-10-CM

## 2020-02-03 DIAGNOSIS — Z87440 Personal history of urinary (tract) infections: Secondary | ICD-10-CM

## 2020-02-03 DIAGNOSIS — N319 Neuromuscular dysfunction of bladder, unspecified: Secondary | ICD-10-CM

## 2020-02-03 DIAGNOSIS — Z20822 Contact with and (suspected) exposure to covid-19: Secondary | ICD-10-CM

## 2020-02-03 DIAGNOSIS — B9620 Unspecified Escherichia coli [E. coli] as the cause of diseases classified elsewhere: Secondary | ICD-10-CM

## 2020-02-03 DIAGNOSIS — M549 Dorsalgia, unspecified: Secondary | ICD-10-CM

## 2020-02-03 DIAGNOSIS — R509 Fever, unspecified: Secondary | ICD-10-CM

## 2020-02-03 DIAGNOSIS — J449 Chronic obstructive pulmonary disease, unspecified: Secondary | ICD-10-CM

## 2020-02-03 DIAGNOSIS — A419 Sepsis, unspecified organism: Secondary | ICD-10-CM

## 2020-02-03 LAB — BASIC METABOLIC PANEL
BKR ANION GAP: 9 (ref 5–18)
BKR BLOOD UREA NITROGEN: 19 mg/dL (ref 8–25)
BKR BUN / CREAT RATIO: 21.1 (ref 8.0–25.0)
BKR CALCIUM: 9.4 mg/dL (ref 8.4–10.3)
BKR CHLORIDE: 107 mmol/L (ref 95–115)
BKR CO2: 28 mmol/L (ref 21–32)
BKR CREATININE: 0.9 mg/dL (ref 0.50–1.30)
BKR EGFR (AFR AMER): >60 mL/min/{1.73_m2} (ref >60)
BKR EGFR (NON AFRICAN AMERICAN): >60 mL/min/{1.73_m2} (ref >60)
BKR GLUCOSE: 108 mg/dL — ABNORMAL HIGH (ref 70–100)
BKR OSMOLALITY CALCULATION: 290 mosm/kg (ref 275–295)
BKR POTASSIUM: 4.2 mmol/L (ref 3.5–5.1)
BKR SODIUM: 144 mmol/L (ref 136–145)

## 2020-02-03 LAB — CBC WITHOUT DIFFERENTIAL
BKR WAM ANALYZER ANC: 3.5 x 1000/ÂµL (ref 1.8–7.3)
BKR WAM HEMATOCRIT: 43.6 % (ref 41.0–54.0)
BKR WAM HEMOGLOBIN: 14 g/dL (ref 13.7–18.0)
BKR WAM MCH (PG): 30 pg (ref 25.7–31.0)
BKR WAM MCHC: 32.1 g/dL (ref 32.0–36.0)
BKR WAM MCV: 93.4 fL (ref 80.0–99.0)
BKR WAM MPV: 9.8 fL (ref 9.8–12.3)
BKR WAM PLATELETS: 268 x1000/ÂµL (ref 140–446)
BKR WAM RDW-CV: 13.3 % (ref 11.5–14.5)
BKR WAM RED BLOOD CELL COUNT: 4.7 M/ÂµL (ref 4.6–6.1)
BKR WAM WHITE BLOOD CELL COUNT: 7 x1000/ÂµL (ref 3.8–10.6)

## 2020-02-03 MED ORDER — ALBUTEROL SULFATE HFA 90 MCG/ACTUATION AEROSOL INHALER
90 mcg/actuation | Freq: Four times a day (QID) | RESPIRATORY_TRACT | 1 refills | Status: AC | PRN
Start: 2020-02-03 — End: 2020-05-19

## 2020-02-03 MED ORDER — SULFUR HEXAFLUORIDE MICROSPHERES 25 MG INTRAVENOUS SUSPENSION
25 mg | Status: CP
Start: 2020-02-03 — End: ?

## 2020-02-03 MED ORDER — CELECOXIB 100 MG CAPSULE
100 mg | ORAL_CAPSULE | Freq: Two times a day (BID) | ORAL | 1 refills | Status: AC | PRN
Start: 2020-02-03 — End: ?

## 2020-02-03 MED ORDER — APIXABAN 5 MG TABLET
5 mg | ORAL_TABLET | ORAL | 3 refills | Status: AC
Start: 2020-02-03 — End: 2020-05-19

## 2020-02-03 MED ORDER — SULFUR HEXAFLUORIDE MICROSPHERES 25 MG INTRAVENOUS SUSPENSION
25 mg | Freq: Once | INTRAVENOUS | Status: DC | PRN
Start: 2020-02-03 — End: 2020-02-04
  Administered 2020-02-03: 13:00:00 25 mL via INTRAVENOUS

## 2020-02-03 MED ORDER — CIPROFLOXACIN 500 MG TABLET
500 mg | ORAL_TABLET | Freq: Two times a day (BID) | ORAL | 1 refills | Status: AC
Start: 2020-02-03 — End: ?
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
## 2020-02-03 NOTE — Discharge Instructions
-  You were admitted with upperabdominal pain and recent onset shortness of breath.-[place] scan showed recurrence of your pulmonary embolism-You were started again on apixaban (Eliquis).-You were seen by Dr.Leibert who agreed with above.-Take albuterol as needed for shortness of breath.-You were also found to have a urinary tract infection and was started on antibiotics (ciprofloxacin)-Quit smoking-Follow up with your doctors.

## 2020-02-03 NOTE — Other
NO show - however, looking at his MR this morning, appears as though he is currently hospitalized.[facility], OTR/L, [place]

## 2020-02-03 NOTE — Other
Pulmonary ConsultIvar TRUSTEN [name] is a 69 y.o. year old man, a former smoker of 1 PPD x 51 years whom I know from prior hospitalizations and the office.When I last saw him in the office on 07/26/2019 the assessment and plan were:AStatus post thoracotomy for resection of a lung nodule.Pathology reveals giant cell interstitial pneumonia.On Eliquis for RLL pulmonary artery thrombosisHepatomegaly and significant right upper quadrant pain developed after his thoracic surgery.  This has since resolved although I am not certain that I understand precisely what the cause was.?P Check D-dimerIf D-dimer is negative will be okay to DC Eliquis.Avoid any work that involves potential toxic inhalations.  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 He is diagnosed with UTI. The abdominal pain is back to baseline.CTA on admission reveals PE in same location as prior. I have reviewed images in detail and discussed with radiology in detail.He notes some dyspnea and tells me he believes that his belly contributes. [name]  has a past medical history of Basal cell carcinoma, Blood transfusion, without reported diagnosis, BPH (benign prostatic hyperplasia), Concussion (04/2016), COPD (chronic obstructive pulmonary disease) (HC Code) (04/10/2015), Dupuytren's contracture of both hands, Fatty liver (10/14/2018), GERD (gastroesophageal reflux disease), Hematuria, History of gastric ulcer, Hypercholesteremia, Hypertension, Migraine, Neurogenic bladder, Osteoarthritis (09/19/2013), Renal colic, Respiratory arrest (HC Code) (04/2016), Sepsis (HC Code), Urinary problem, and UTI (urinary tract infection).[name]  has a past surgical history that includes anterior cervical fusion (03/2012); Mohs surgery; repair dupuytrens contracture (Bilateral); Arthroscopy shoulder w/ open rotator cuff repair (Right, 2012); and Cervical spine surgery.[name]  reports that he quit smoking about 16 months ago. His smoking use included cigarettes. He smoked 0.25 packs per day. He has never used smokeless tobacco. He reports current alcohol use of about 1.0 - 2.0 standard drinks of alcohol per week. He reports that he does not use drugs.[name] family history includes Arthritis in his father and mother.[name] is allergic to percocet [oxycodone-acetaminophen] and duoneb [ipratropium-albuterol].Current Facility-Administered Medications: ?  acetaminophen (TYLENOL) tablet 650 mg, 650 mg, Oral, Q6H PRN, Vojnic, Morana, MD, 650 mg at 02/03/20 0319?  albuterol sulfate 90 mcg/actuation HFA aerosol inhaler 2 puff, 2 puff, Inhalation, Q6H PRN, Vojnic, Morana, MD?  apixaban (ELIQUIS) tablet 10 mg, 10 mg, Oral, Q12H, 10 mg at 02/03/20 0906 **FOLLOWED BY** [START ON 02/06/2020] apixaban (ELIQUIS) tablet 5 mg, 5 mg, Oral, Q12H, Vojnic, Morana, MD?  baclofen (LIORESAL) tablet 5 mg, 5 mg, Oral, BID PRN, Vojnic, Morana, MD, 5 mg at 02/01/20 2029 ?  ciprofloxacin HCl (CIPRO) tablet 500 mg, 500 mg, Oral, Q12H, [name], MD, 500 mg at 02/03/20 3086?  [Held by provider] hydroCHLOROthiazide (HYDRODIURIL) tablet 25 mg, 25 mg, Oral, Daily, Vojnic, Morana, MD?  HYDROmorphone (DILAUDID) tablet 4 mg, 4 mg, Oral, Q6H PRN, Vojnic, Morana, MD, 4 mg at 02/03/20 0319?  melatonin tablet 3 mg, 3 mg, Oral, Nightly PRN, Vojnic, Morana, MD, 3 mg at 02/02/20 2117?  nicotine (NICODERM CQ) transdermal patch 24 hr 7 mg, 7 mg, Transdermal, Daily, Vojnic, Morana, MD?  pantoprazole (PROTONIX) EC tablet 40 mg, 40 mg, Oral, Daily, Vojnic, Morana, MD, 40 mg at 02/03/20 0906?  sodium chloride 0.9 % flush 3 mL, 3 mL, IV Push,  Q8H, Vojnic, Morana, MD, 3 mL at 02/02/20 2118?  sodium chloride 0.9 % flush 3 mL, 3 mL, IV Push, PRN for Line Care, Vojnic, Morana, MD?  Sulfur hexafluoride Microspheres (LUMASON) Intravenous suspension, 4 mL, Intravenous, ONCE PRN, [name], MD, 4 mL at 02/03/20 0818Temp:  [97 ?F (36.1 ?C)-98.3 ?F (36.8 ?C)] 97 ?F (36.1 ?C)Pulse:  [68-98] 85Resp:  [17-20] 17BP: (121-149)/(74-94) 149/94SpO2:  [94 %-97 %] 94 %Device (Oxygen Therapy): room airAlert and fully engaged.No prolongation of the expiratory phase.No wheezes, rales or rhonchiHeart is regularExtremities are without clubbing cyanosis or edema.Recent Labs   03/06/210606 03/07/210638 03/08/210611 NA 141 140 144 K 3.4* 3.6 4.2 CL 104 107 107 CO2 30 27 28  BUN 15 14 19  CREATININE 0.86 0.91 0.90 GLU 121* 115* 108* Recent Labs   03/06/210605 03/07/210638 03/08/210611 WBC 8.1 6.5 7.0 HGB 14.0 15.2 14.0 PLT 218 252 268  Ref. Range 08/06/2017 10:54 10/11/2018 19:44 07/26/2019 09:47 01/30/2020 18:05 D-Dimer Latest Ref Range: <0.49 mg/L FEU 0.45 2.82 (H) 0.99 (H) 1.23 (H) PFT 09/28/2020Vital capacity is normal.Flow rates are reduced relative to vital capacity. There is no significant response to inhaled bronchodilators at the time of the study.Fractional excretion of nitric oxide is low at seven parts per billion.Assessment:Mild obstructive airways dysfunction.Compared to the prior study of 12/27/2017 flow rates are perhaps slightly lower.Echo yesterdayResults pendingXr Chest Pa Or ApResult Date: 3/4/2021IMPRESSION: No acute cardiopulmonary abnormality. Reported and Signed by:  Azzie Glatter, MDResults for orders placed or performed during the hospital encounter of 01/30/20 CTA Chest (PE) w IV Contrast  Narrative  STUDY: CTA CHEST (PE) W IV CONTRAST, [place] ABDOMEN PELVIS W IV CONTRASTHISTORY: Shortness of breath, positive d-dimer.COMPARISON: 10/11/2018.TECHNIQUE: CTA of the chest was performed following administration of 70 cc Omnipaque 350 intravenous contrast. Sagittal and coronal reformatted images are additionally provided, as well as 3D MIPS. This [place] scan was performed utilizing techniques to reduce radiation dose including Automated Exposure Control, mA / kV adjustment for patient size, and / or iterative reconstruction.FINDINGS:There is again thrombus within the right lower lobe pulmonary artery extending to the lobectomy site, unchanged. No new pulmonary embolus is seen. The main pulmonary artery is not enlarged.There are severe emphysematous changes. There is no pleural effusion or pneumothorax. There are is increased reticulation in the left infrahilar region (image 56 series 5), possibly infectious or inflammatory. There is evidence of prior right lower lobe wedge resection. The central airways are patent. There is pleural thickening and mild atelectatic change within the periphery of the both lungs. The heart is normal in size. There is no pericardial effusion. There is no aneurysm of the thoracic aorta. There are moderate coronary artery calcifications. There are multiple prominent mediastinal and hilar lymph nodes, for example a 1 cm right hilar node (image 45 series 5), and a 1 m right lower paratracheal node (image 34 series 5).The liver, gallbladder, spleen, adrenal glands, kidneys, and pancreas are unremarkable except for left renal hypodensities too small to characterize.The esophagus is fluid-filled and mildly dilated. There is no bowel obstruction, free fluid, or free air. The appendix is unremarkable. There is diverticulosis without evidence of diverticulitis. There is no abdominal or pelvic lymphadenopathy.No aggressive osseous lesions are identified. There is a fat-containing left inguinal hernia.  Impression  IMPRESSION:1. No new pulmonary embolus. 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 Increased reticulation in the left infrahilar region, possibly infectious/inflammatory in nature. Recommend follow-up to resolution. Prominent mediastinal adenopathy may be reactive, and can be followed  on subsequent [place].3. Mildly dilated and fluid-filled esophagus raising concern for reflux.4. Diverticulosis without evidence of diverticulitis.Reported and Signed by:  Azzie Glatter, MD Results for orders placed or performed during the hospital encounter of 07/26/19 [place] chest with IV contrast  Narrative  Examination: [place] CHEST W IV CONTRAST, 07/26/2019 7:45 AMClinical indication: Follow-up 1. Giant cell interstitial pneumonia following resection 2. Right lower lobe Pulmonary artery thrombosis Technique: After the intravenous administration of 70 mL of Omnipaque 350 a postcontrast [place] of the chest was performed. Individual dose optimization technique was used for the performance of this procedure, and one or more of the following dose reduction techniques was used: Automated exposure control and/or adjustment of the MA and/or kV according to patient size and/or the use of iterative reconstruction technique.Comparison: November 14, 2019Findings:    Chest:    The prior right lower lobe segmental embolus is not seen though study is not optimized for evaluation of pulmonary emboli. There is no central embolus.Centrilobular emphysema with upper lobe predominance is unchanged. There are expected postsurgical changes at the right base. There is some stable right hilar nodal prominence which is subcentimeter in short axis, series 7 image 60. Attention on follow-up is recommended. There are no suspicious pulmonary nodules.  The central airways are patent. No mediastinal adenopathy. Heart is normal in size. No axillary adenopathy.Limited evaluation of the upper abdomen is unremarkable. There are no suspicious osseous lesions.  Impression  Impression: 1.  Expected postsurgical change at the right base without evidence of local recurrence. Some right hilar nodal prominence is stable, possibly reactive, attention on follow-up recommended.2.  Prior right lower lobe segmental embolus is not seen. No central pulmonary embolus.3.  Extensive upper lobe predominant emphysemaReported and Signed by:  Lonna Cobb, MD Results for orders placed or performed during the hospital encounter of 09/17/18  Chest wo IV Contrast  Narrative   chest noncontrastCLINICAL INFORMATION: Follow-up nodule, is it vascular?, Super DCOMPARISON: 08/23/2018 and 07/05/2017. TECHNIQUE: Multislice spiral acquisition was performed using the Super D protocol and multiplanar reformatting. Individual dose optimization technique was used for the performance of this procedure, and one or more of the following dose reduction techniques was used: Automated exposure control and/or adjustment of the MA and/or kV according to patient size and/or the use of iterative reconstruction technique.FINDINGS: An oval nodule is seen at a bifurcation of a peripheral vessel in the superior segment of the right lower lobe, measuring currently about 8 mm in maximum AP diameter, unchanged from most recent prior study,, measured at about 7 mm 07/05/2017. The nodule is smooth, noncalcified, without irregular or spiculated margins.No other pulmonary nodule, mass or infiltrate is seen. Moderate emphysema is most pronounced in the upper lobes. No primary interstitial lung disease is seen. There is no pleural fluid or pneumothorax. The trachea and central airways are unremarkable.A few small lymph nodes in the middle mediastinum are unchanged. No mediastinal mass is identified. Scattered atherosclerotic calcification in the wall of the aorta is noted and a few scattered coronary arterial calcifications are noted. There is no pericardial fluid. No esophageal dilation or wall thickening is seen.Scans continued into the upper abdomen reveal no adrenal enlargement. The visualized liver and the spleen reveal no specific abnormalities. There are no other specific abnormalities or changes from previous examination identified.  Impression  IMPRESSION: A nodule in the superior segment of the right lower lobe reveals no significant change when compared with previous recent study, possibly increased 1 mm since prior study 07/05/2017.Reported and Signed by:  Hal Neer, MD A RLL pulmonary artery thrombosis. This was better on the interim scan (though not optimized for PE.) It is clearly back, in the same location.While there is the possibility that it has endothelialized and is no longer a threat, the evolution argues against.Status post thoracotomy for resection of a lung nodule.Pathology reveals giant cell interstitial pneumonia.Hepatomegaly and significant right upper quadrant pain developed after his thoracic surgery.  I remain uncertain of the cause.Very mild COPD with near normal airflows after 51 years of smoking and thoracic surgery?P At this point I favor lifetime anticoagulationAvoid any work that involves potential toxic inhalations.  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 I would avoid LAMA ( or ipratropium) given his bladder issues.Zalen Sequeira Leibert3/06/2020 9:27 AMBeeper [id number], Extension [id number] [phone] for any questions
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
## 2020-02-03 NOTE — Plan of Care
Plan of Care Overview/ Patient Status    1900 - Received patient in bed during handoff shift report. Dual RN bedside handoff completed. General Appearance; 69 y/o male admitted on 3/4 for abdominal pain, weakness for last few days and recent foul smelling urine. 3/7 - [place] showing PE in right lower lobe. Patient has been c/o DOE and SOB occasionally while walking. MD are aware. A/O x4. RA; no signs of respiratory distress. VSS, afebrile, pain is adequately managed at this time. Call bell placed within reach, bed alarm active. Patient informed to call when they need any assistance.All personal belongings are within patients reach.IVF; Heplocked, no signs of inflammation noted at IV site.Lungs; equal, clear, bilateral to auscultation. Denies chest pain, no SOB noted. Encouraged use of incentive spirometer at 1500psi, 10xs hourly while awake.- History of COPD, no use of home oxygen. PRN inhalers. Abdomen; soft, rounded, non-tender, no pain upon palpation. (+) bowel sounds in all four quadrants. (+) flatus. GU; Patient self catheterizes due to neurogenic bladder; voiding clear yellow urine painlessly and spontaneously as per patient. - Catheterization kits provided to patient as needed.LBM; 3/7 Mobility; OOB independently - steady gait noted. (+)pp (+) csm, denies numbness and tingling. Good muscle strength, (+) dorsi/plantarflexion. Vitals:  02/02/20 2018 BP: 135/84 Pulse: 85 Resp: 20 Temp: 97.5 ?F (36.4 ?C) 2116 - Patient c/o pain 7/10. Administered prn Tylenol 650mg  and 4mg  Dilaudid. - Patient also requested melatonin at this time.- Pain reassessed in one hour, patient asleep comfortably, no signs of pain at this time.1610 - Patient c/o pain 9/10 which woke him up from sleep. Administered prn Tylenol 650mg  and 4mg  Dilaudid. Patient/Family acknowledge understanding of fall prevention education including to call nurse with assistance with ambulation.Electronically Signed by Marlana Salvage, RN, March 7, 2021Problem: Adult Inpatient Plan of CareGoal: Plan of Care ReviewOutcome: Interventions implemented as appropriateGoal: Patient-Specific Goal (Individualized)Outcome: Interventions implemented as appropriateGoal: Absence of Hospital-Acquired Illness or InjuryOutcome: Interventions implemented as appropriateGoal: Optimal Comfort and WellbeingOutcome: Interventions implemented as appropriateGoal: Readiness for Transition of CareOutcome: Interventions implemented as appropriate Problem: UTI (Urinary Tract Infection)Goal: Improved Infection SymptomsOutcome: Interventions implemented as appropriate Problem: Fall Injury RiskGoal: Absence of Fall and Fall-Related InjuryOutcome: Interventions implemented as appropriate

## 2020-02-03 NOTE — Plan of Care
Problem: Adult Inpatient Plan of CareGoal: Plan of Care ReviewOutcome: Interventions implemented as appropriateGoal: Patient-Specific Goal (Individualized)Outcome: Interventions implemented as appropriateGoal: Absence of Hospital-Acquired Illness or InjuryOutcome: Interventions implemented as appropriateGoal: Optimal Comfort and WellbeingOutcome: Interventions implemented as appropriateGoal: Readiness for Transition of CareOutcome: Interventions implemented as appropriate Problem: UTI (Urinary Tract Infection)Goal: Improved Infection SymptomsOutcome: Interventions implemented as appropriate Problem: Fall Injury RiskGoal: Absence of Fall and Fall-Related InjuryOutcome: Interventions implemented as appropriate Plan of Care Overview/ Patient Status    0700- Dual RN bedside handoff completedPatient resting in bed. A&Ox4. VSS.Denies CP, SOB. Abdomen rounded/obese, non-tender, non-distended. +BSx4. +flatus, BM yesterday. Denies N/V. C/O upper abdominal pain frequently. 4 mg PO Dilaudid given with good effect at 0959. Tolerating regular diet with good appetite. Lung sounds CTA. Had ECHO off floor via bed this morning. Occasional baseline numbness to bilateral fingers.OOB independently with steady gait. Patient understands to call for assistance anytime. Self-catheterizes. Discussed technique with patient and patient verbalized understanding of how to maintain clean procedure. Didn't measure 2x he cathed self; stated it was a large amount both times. Used Hospital supplies; he ran out of his own. PO fluids encouraged. Refused SCDs. OOB walking in hallway fairly often. On Eliquis. Refused Nicotine patch today. Frequent rounding for needs and safety. Patient understands to call for assistance anytime. Patient cleared for d/c home. All questions answered. Patient verbalized understanding of how to proceed with Eliquis dosing as well as antibiotic. PIV removed. NAD at 1635; patient accompanied to front entrance where he took a taxi home. After Care Instructions for a Peripheral Intravenous (IV) CatheterA peripheral IV catheter is a plastic tube that is inserted into a vein used to give fluids, antibiotics, medications, or blood products.  The IV catheter is removed when no longer needed or for site pain, redness, swelling, or leaking.  Care of the area:When the IV catheter is removed, a small dressing is placed over the site.  ?	Leave the dressing in place for at least 30 minutes.?	Keep area cleanCheck the area for redness, swelling, pain, drainage, and warmth.?	If redness or pain develops, you may apply warm compresses to the area and elevate the hand/arm.?	Call your doctor if symptoms become worse.Bruising:Bruising may occur after IV catheter removal for patients who have a tendency to bleed due to certain medications (i.e. blood thinners or long-term steroids). ?	Bruising goes away on its own over time and can take 1 - 2 weeks. ?	Bruising may or may not be tender or painful and color changes (blue, dark blue, purple-red, yellow) are normal.?	You may apply ice to the area and elevate your arm.?	Call your doctor if bruising gets worseIn the future, if you need an IV catheter or needle to be placed or removed, tell the clinician if you tend to bruise easily or are on medication that may cause bleeding

## 2020-02-03 NOTE — Plan of Care
Plan of Care Overview/ Patient Status    0715. Pt received sleeping in bed. 0815. Pt out of bed in bathroom. Lungs clear and equal b/l. Abdomen is soft, extra large and round, non-tender. BM this morning. +BS. Pt states having pain this morning, pain meds admin. 1000. Pt lying in bed watching TV, no needs at this time. 1230. Pt up in bed eating lunch. Valuables within reach.1400. Pt out of bed ambulating in hallway w/ Clinical research associate. Shower and linens changed.1600. Pt in bed watching Tv no needs at this time. 1800. Pt up to void, using his last catheter from home, unable to use the ones we provided because they are too soft. Will provide catheter kit. 1900. Pt states the straight  Cath kit has worked in the past and will use it next time he voids. Problem: Adult Inpatient Plan of CareGoal: Plan of Care ReviewOutcome: Interventions implemented as appropriateGoal: Patient-Specific Goal (Individualized)Outcome: Interventions implemented as appropriateGoal: Absence of Hospital-Acquired Illness or InjuryOutcome: Interventions implemented as appropriateGoal: Optimal Comfort and WellbeingOutcome: Interventions implemented as appropriateGoal: Readiness for Transition of CareOutcome: Interventions implemented as appropriate Problem: UTI (Urinary Tract Infection)Goal: Improved Infection SymptomsOutcome: Interventions implemented as appropriate Problem: Fall Injury RiskGoal: Absence of Fall and Fall-Related InjuryOutcome: Interventions implemented as appropriate

## 2020-02-05 LAB — BLOOD CULTURE   (BH GH L LMW YH)
BKR BLOOD CULTURE: NO GROWTH
BKR BLOOD CULTURE: NO GROWTH

## 2020-02-11 ENCOUNTER — Inpatient Hospital Stay: Admit: 2020-02-11 | Discharge: 2020-02-11 | Payer: MEDICARE | Primary: Family Medicine

## 2020-02-11 DIAGNOSIS — M720 Palmar fascial fibromatosis [Dupuytren]: Secondary | ICD-10-CM

## 2020-02-11 NOTE — Other
Alyjah cancelled this morning - [facility], OTR/L, CHT

## 2020-02-17 ENCOUNTER — Inpatient Hospital Stay: Admit: 2020-02-17 | Discharge: 2020-02-17 | Payer: MEDICARE | Primary: Family Medicine

## 2020-02-17 NOTE — Other
Therapist called Front desk - sitting in not-moving traffic on the [place] - patient kindly rescheduled appointment for tomorrow morning.[facility], OTR/L, [place]

## 2020-02-18 ENCOUNTER — Inpatient Hospital Stay: Admit: 2020-02-18 | Discharge: 2020-02-18 | Payer: MEDICARE | Primary: Family Medicine

## 2020-02-18 DIAGNOSIS — M720 Palmar fascial fibromatosis [Dupuytren]: Secondary | ICD-10-CM

## 2020-02-18 NOTE — Other
OCCUPATIONAL THERAPY - LONG RIDGEOccupational Therapy - [place] [facility] [address]Occupational Therapy - Hand Therapy Daily Note3/23/2021Patient Name:  Henry Kersh HelgesonMedical Record Number:  [id number] Date of Birth:  [birth year]Therapist:  [facility], OTR/L, CHTReferring Provider:  Margie Ege, MDICD-10 Diagnosis(es):Problem List         ICD-10-CM   s/p dupuytren's fasciotomy R Thumb and Little Finger  * (Principal) Dupuytren's contracture of right hand M72.0  General InformationTreatment Number:???#[id number]??Date the Treatment Plan was Initiated/Reviewed:??01/06/2020??Start of Care Date:???01/06/2020??Onset of Illness/Injury Date:???12/02/2018??Date of Surgery:???12/19/2019??Progress Report Due Date:???02/20/2020??MD Order Renewal Date:???3/25/2021Medication Review:Current Outpatient Medications Medication Sig ? albuterol sulfate Inhale 2 puffs into the lungs every 6 (six) hours as needed for wheezing. ? apixaban Take 2 tablets (10 mg total) by mouth 2 (two) times daily for 3 days, THEN 1 tablet (5 mg total) 2 (two) times daily. ? ascorbic acid (vitamin C) Take 1,000 mg by mouth daily.  ? baclofen Take 5 mg by mouth 2 (two) times daily as needed. ? celecoxib Take 1 capsule (100 mg total) by mouth 2 (two) times daily as needed. ? CHOLECALCIFEROL, VITAMIN D3, ORAL Take 1 capsule by mouth daily. ? hydroCHLOROthiazide Take 1 tablet (25 mg total) by mouth daily. ? HYDROmorphone Take 4 mg by mouth every 6 (six) hours as needed (Pain). ? melatonin Take 5-10 mg by mouth nightly as needed (Sleep).  ? pantoprazole Take 1 tablet (40 mg total) by mouth daily. ? potassium Take 99 mg by mouth daily. ? varenicline Take 1 mg by mouth daily. Take as directed. 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

## 2020-02-24 ENCOUNTER — Inpatient Hospital Stay: Admit: 2020-02-24 | Discharge: 2020-02-24 | Payer: MEDICARE | Primary: Family Medicine

## 2020-02-24 DIAGNOSIS — M720 Palmar fascial fibromatosis [Dupuytren]: Secondary | ICD-10-CM

## 2020-02-24 NOTE — Other
Patient did not show for therapy today. Phone call made, no voice mail and no answer.[facility], OTR/L, [place]

## 2020-02-25 ENCOUNTER — Ambulatory Visit
Admit: 2020-02-25 | Payer: PRIVATE HEALTH INSURANCE | Attending: Student in an Organized Health Care Education/Training Program | Primary: Family Medicine

## 2020-02-26 ENCOUNTER — Ambulatory Visit
Admit: 2020-02-26 | Payer: PRIVATE HEALTH INSURANCE | Attending: Student in an Organized Health Care Education/Training Program | Primary: Family Medicine

## 2020-02-26 DIAGNOSIS — M720 Palmar fascial fibromatosis [Dupuytren]: Secondary | ICD-10-CM

## 2020-05-19 MED ORDER — ALBUTEROL SULFATE HFA 90 MCG/ACTUATION AEROSOL INHALER
90 | Freq: Four times a day (QID) | RESPIRATORY_TRACT | 1 refills | 25.00000 days | Status: AC | PRN
Start: 2020-05-19 — End: 2020-06-11

## 2020-05-19 MED ORDER — APIXABAN 5 MG TABLET
5 | ORAL_TABLET | Freq: Two times a day (BID) | ORAL | 4 refills | Status: SS
Start: 2020-05-19 — End: 2020-05-27

## 2020-05-21 ENCOUNTER — Emergency Department: Admit: 2020-05-21 | Payer: PRIVATE HEALTH INSURANCE | Primary: Family Medicine

## 2020-05-21 ENCOUNTER — Inpatient Hospital Stay: Admit: 2020-05-21 | Discharge: 2020-05-27 | Payer: MEDICARE | Source: Home / Self Care | Admitting: Internal Medicine

## 2020-05-21 DIAGNOSIS — R0602 Shortness of breath: Principal | ICD-10-CM

## 2020-05-21 LAB — COMPREHENSIVE METABOLIC PANEL
BKR A/G RATIO: 0.9
BKR ALANINE AMINOTRANSFERASE (ALT): 63 U/L (ref 12–78)
BKR ALBUMIN: 3.7 g/dL (ref 3.4–5.0)
BKR ALKALINE PHOSPHATASE: 262 U/L — ABNORMAL HIGH (ref 20–135)
BKR ANION GAP: 8 (ref 5–18)
BKR ASPARTATE AMINOTRANSFERASE (AST): 41 U/L — ABNORMAL HIGH (ref 5–37)
BKR AST/ALT RATIO: 0.7
BKR BILIRUBIN TOTAL: 0.5 mg/dL (ref 0.0–1.0)
BKR BLOOD UREA NITROGEN: 17 mg/dL (ref 8–25)
BKR BUN / CREAT RATIO: 20.5 (ref 8.0–25.0)
BKR CALCIUM: 9.3 mg/dL (ref 8.4–10.3)
BKR CHLORIDE: 110 mmol/L (ref 95–115)
BKR CO2: 27 mmol/L (ref 21–32)
BKR CREATININE: 0.83 mg/dL (ref 0.50–1.30)
BKR EGFR (AFR AMER): >60 mL/min/{1.73_m2} (ref >60)
BKR EGFR (NON AFRICAN AMERICAN): >60 mL/min/{1.73_m2} (ref >60)
BKR GLOBULIN: 4.1 g/dL
BKR GLUCOSE: 124 mg/dL — ABNORMAL HIGH (ref 70–100)
BKR OSMOLALITY CALCULATION: 292 mosm/kg (ref 275–295)
BKR POTASSIUM: 3.7 mmol/L (ref 3.5–5.1)
BKR PROTEIN TOTAL: 7.8 g/dL (ref 6.4–8.2)
BKR SODIUM: 145 mmol/L (ref 136–145)

## 2020-05-21 LAB — CBC WITH AUTO DIFFERENTIAL
BKR WAM ABSOLUTE IMMATURE GRANULOCYTES: 0 x 1000/ÂµL (ref 0.0–0.2)
BKR WAM ABSOLUTE LYMPHOCYTE COUNT: 1.6 x 1000/ÂµL (ref 1.0–2.3)
BKR WAM ABSOLUTE NRBC: 0 x 1000/ÂµL (ref <=0.0)
BKR WAM ANALYZER ANC: 6.4 x 1000/ÂµL (ref 1.8–7.3)
BKR WAM BASOPHIL ABSOLUTE COUNT: 0.1 x 1000/ÂµL (ref 0.0–0.6)
BKR WAM BASOPHILS: 0.6 % (ref 0.0–2.0)
BKR WAM EOSINOPHIL ABSOLUTE COUNT: 0.3 x 1000/ÂµL (ref 0.0–0.4)
BKR WAM EOSINOPHILS: 3.5 % (ref 0.0–6.0)
BKR WAM HEMATOCRIT: 45.1 % (ref 41.0–54.0)
BKR WAM HEMOGLOBIN: 15.2 g/dL (ref 13.7–18.0)
BKR WAM IMMATURE GRANULOCYTES: 0.3 % (ref 0.0–2.0)
BKR WAM LYMPHOCYTES: 18.2 % (ref 14.0–43.0)
BKR WAM MCH (PG): 30.5 pg (ref 25.7–31.0)
BKR WAM MCHC: 33.7 g/dL (ref 32.0–36.0)
BKR WAM MCV: 90.6 fL (ref 80.0–99.0)
BKR WAM MONOCYTE ABSOLUTE COUNT: 0.4 x 1000/ÂµL (ref 0.4–1.3)
BKR WAM MONOCYTES: 4.9 % (ref 0.0–14.0)
BKR WAM MPV: 9.7 fL — ABNORMAL LOW (ref 9.8–12.3)
BKR WAM NEUTROPHILS: 72.5 % (ref 38.0–74.0)
BKR WAM NUCLEATED RED BLOOD CELLS: 0 % (ref 0.0–0.2)
BKR WAM PLATELETS: 273 x1000/ÂµL (ref 140–446)
BKR WAM RDW-CV: 13.3 % (ref 11.5–14.5)
BKR WAM RED BLOOD CELL COUNT: 5 M/ÂµL (ref 4.6–6.1)
BKR WAM WHITE BLOOD CELL COUNT: 8.8 x1000/ÂµL (ref 3.8–10.6)

## 2020-05-21 LAB — SARS COV-2 (COVID-19) RNA: BKR SARS-COV-2 RNA (COVID-19) (YH): NOT DETECTED

## 2020-05-21 LAB — C-REACTIVE PROTEIN     (CRP): BKR C-REACTIVE PROTEIN: 1.2 mg/dL — ABNORMAL HIGH (ref 0.0–1.0)

## 2020-05-21 LAB — URINE MICROSCOPIC     (BH GH LMW YH)

## 2020-05-21 LAB — ZZZURINALYSIS WITH CULTURE REFLEX     (L Q)
BKR BILIRUBIN, UA: NEGATIVE
BKR BLOOD, UA: NEGATIVE
BKR GLUCOSE, UA: NEGATIVE
BKR KETONES, UA: NEGATIVE
BKR LEUKOCYTE ESTERASE, UA: POSITIVE — AB
BKR NITRITE, UA: NEGATIVE
BKR PH, UA: 8 — ABNORMAL HIGH (ref 5.5–7.5)
BKR SPECIFIC GRAVITY, UA: 1.025 (ref 1.005–1.030)
BKR UROBILINOGEN, UA: 2 EU/dL (ref <=2.0)

## 2020-05-21 LAB — PROCALCITONIN     (BH GH LMW Q YH): BKR PROCALCITONIN: 0.04 ng/mL

## 2020-05-21 LAB — PROTIME AND INR
BKR INR: 1.03 (ref 0.88–1.14)
BKR PROTHROMBIN TIME: 11.3 s (ref 9.4–12.0)

## 2020-05-21 LAB — LACTIC ACID, PLASMA (REFLEX 2H REPEAT): BKR LACTATE: 1.2 mmol/L (ref 0.4–2.0)

## 2020-05-21 LAB — CARDIAC PROFILE (TROPONIN I REFLEX CK/CKMB): BKR TROPONIN I: <0.015 ng/mL (ref 0.000–1.500)

## 2020-05-21 LAB — D-DIMER, QUANTITATIVE: BKR D-DIMER: 1.09 mg{FEU}/L — ABNORMAL HIGH (ref <0.49)

## 2020-05-21 MED ORDER — ALBUTEROL SULFATE 1.25 MG/3 ML SOLUTION FOR NEBULIZATION
1.253 mg/3 mL | Freq: Four times a day (QID) | RESPIRATORY_TRACT | Status: DC | PRN
Start: 2020-05-21 — End: 2020-05-22
  Administered 2020-05-22: 01:00:00 1.25 mL via RESPIRATORY_TRACT

## 2020-05-21 MED ORDER — HYDROCHLOROTHIAZIDE 25 MG TABLET
25 mg | Freq: Every day | ORAL | Status: DC
Start: 2020-05-21 — End: 2020-05-27
  Administered 2020-05-22 – 2020-05-27 (×6): 25 mg via ORAL

## 2020-05-21 MED ORDER — ALBUTEROL SULFATE 2.5 MG/3 ML (0.083 %) SOLUTION FOR NEBULIZATION
2.5 mg /3 mL (0.083 %) | Freq: Once | RESPIRATORY_TRACT | Status: CP
Start: 2020-05-21 — End: ?
  Administered 2020-05-21: 20:00:00 2.5 mL via RESPIRATORY_TRACT

## 2020-05-21 MED ORDER — IOHEXOL 350 MG IODINE/ML INTRAVENOUS SOLUTION
350 mg iodine/mL | Freq: Once | INTRAVENOUS | Status: CP | PRN
Start: 2020-05-21 — End: ?
  Administered 2020-05-21: 18:00:00 350 mL via INTRAVENOUS

## 2020-05-21 MED ORDER — METHYLPREDNISOLONE SOD SUCC (PF) 125 MG/2 ML SOLUTION FOR INJECTION
125 mg/2 mL | Freq: Once | INTRAVENOUS | Status: CP
Start: 2020-05-21 — End: ?
  Administered 2020-05-21: 20:00:00 125 mL via INTRAVENOUS

## 2020-05-21 MED ORDER — HYDROMORPHONE 4 MG TABLET
4 mg | Freq: Four times a day (QID) | ORAL | Status: DC | PRN
Start: 2020-05-21 — End: 2020-05-27
  Administered 2020-05-22 – 2020-05-27 (×18): 4 mg via ORAL

## 2020-05-21 MED ORDER — SODIUM CHLORIDE 0.9 % BOLUS (NEW BAG)
0.9 % | Freq: Once | INTRAVENOUS | Status: CP
Start: 2020-05-21 — End: ?
  Administered 2020-05-21: 13:00:00 0.9 mL/h via INTRAVENOUS

## 2020-05-21 MED ORDER — LORAZEPAM 1 MG TABLET
1 mg | Freq: Four times a day (QID) | ORAL | Status: DC | PRN
Start: 2020-05-21 — End: 2020-05-27

## 2020-05-21 MED ORDER — SODIUM CHLORIDE 0.9 % (FLUSH) INJECTION SYRINGE
0.9 % | Freq: Three times a day (TID) | INTRAVENOUS | Status: DC
Start: 2020-05-21 — End: 2020-05-27
  Administered 2020-05-22 – 2020-05-27 (×8): 0.9 mL via INTRAVENOUS

## 2020-05-21 MED ORDER — HYDRALAZINE 20 MG/ML INJECTION SOLUTION
20 mg/mL | INTRAVENOUS | Status: DC | PRN
Start: 2020-05-21 — End: 2020-05-27

## 2020-05-21 MED ORDER — SODIUM CHLORIDE 0.9 % (FLUSH) INJECTION SYRINGE
0.9 % | INTRAVENOUS | Status: DC | PRN
Start: 2020-05-21 — End: 2020-05-27
  Administered 2020-05-22: 13:00:00 0.9 mL via INTRAVENOUS

## 2020-05-21 MED ORDER — PREGABALIN 100 MG CAPSULE
100 mg | Freq: Two times a day (BID) | ORAL | Status: SS
Start: 2020-05-21 — End: 2020-05-27

## 2020-05-21 MED ORDER — PREGABALIN 50 MG CAPSULE
50 mg | Freq: Two times a day (BID) | ORAL | Status: DC
Start: 2020-05-21 — End: 2020-05-27
  Administered 2020-05-22 – 2020-05-27 (×12): 50 mg via ORAL

## 2020-05-21 MED ORDER — METHYLPREDNISOLONE SOD SUCC (PF) 40 MG/ML SOLUTION FOR INJECTION
40 mg/mL | Freq: Three times a day (TID) | INTRAVENOUS | Status: DC
Start: 2020-05-21 — End: 2020-05-22
  Administered 2020-05-22: 10:00:00 40 mL via INTRAVENOUS

## 2020-05-21 MED ORDER — IBUPROFEN 600 MG TABLET
600 mg | Freq: Once | ORAL | Status: CP
Start: 2020-05-21 — End: ?
  Administered 2020-05-21: 20:00:00 600 mg via ORAL

## 2020-05-21 MED ORDER — APIXABAN 5 MG TABLET
5 mg | Freq: Two times a day (BID) | ORAL | Status: DC
Start: 2020-05-21 — End: 2020-05-27
  Administered 2020-05-22 – 2020-05-25 (×8): 5 mg via ORAL

## 2020-05-21 NOTE — Plan of Care
Plan of Care Overview/ Patient Status    At 1730 pt came to [facility] in room no 2-170W,pt aox3.oriented to the floor and call bell verbalises understanding. sbp in 160's. MD made aware.Connected to 2l 02 via nc spo2 94% RTM on. Increase sob noted on exertion. Mild wheezing noted on exertion.Encourage to use incentive spirometer. Tol well with the diet. Denies any pain at present. Oob ind. REFUSED ALARMS. Cont to monitor.
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
## 2020-05-21 NOTE — Other
PHARMACY-ASSISTED MEDICATION REPORTPharmacist review of the best possible medication history obtained by the pharmacy medication history technician has been performed.  I have updated the home medication list and identified the following information that may be relevant to this admission. Recommendations:  Patient reports they are not familiar with the medication Jardiance although patient has an electronic fill record for Jardiance 25mg  filled for #90 on 12/07/19 and 03/06/20.  Please assess if this medication is necessary clinically for patient and if patient education is warranted.  Notes:noneHome medication list updated and reviewed. Medication history completed through interview of patient and review of pharmacy records. Current Medications Medication Sig  albuterol sulfate 90 mcg/actuation HFA aerosol inhaler Inhale 2 puffs into the lungs every 6 (six) hours as needed for wheezing.  apixaban (ELIQUIS) 5 mg tablet Take 1 tablet (5 mg total) by mouth 2 (two) times daily.  ascorbic acid, vitamin C, (VITAMIN C) 1000 mg tablet Take 1,000 mg by mouth daily.   baclofen (LIORESAL) 5 mg tablet Take 5 mg by mouth 2 (two) times daily as needed.  CHOLECALCIFEROL, VITAMIN D3, ORAL Take 1 capsule by mouth daily.  hydroCHLOROthiazide (HYDRODIURIL) 25 mg tablet Take 1 tablet (25 mg total) by mouth daily.  HYDROmorphone (DILAUDID) 4 mg tablet Take 4 mg by mouth every 6 (six) hours as needed (Pain).  melatonin 5 mg tablet Take 5-10 mg by mouth nightly as needed (Sleep).   pantoprazole (PROTONIX) 40 mg tablet Take 1 tablet (40 mg total) by mouth daily.  potassium 99 mg Tab Take 99 mg by mouth daily.  pregabalin (LYRICA) 100 mg capsule Take 100 mg by mouth 2 (two) times daily.  Dorna Leitz, RPH4:54 PM6/24/2021Phone: Available via [employer] [facility] ([facility])

## 2020-05-21 NOTE — ED Notes
PT c/o SOB, DOE, and chills for the past few days. States SOB worsening when he lays flat. Desats down to 92% on RA, placed on 2 L O2 NC, reports feeling better. Breath sounds clear. States upper abd pain for 2 years, denies n/v. Last BM today. + distended/rounded abd noted. HTN on arrival. Denies chest pain. Urine obtained via straight cath, dark in color. Saline lock placed, bloods sent to lab. Remains on cardiac and pulse ox monitoring. 1640: PT ambulates to bathroom without assistance, but becomes dyspneic, + wheezing on return. Albuterol neb given, PT repots feeling better. PT placed on RTM box, monitor tech able to see PT. Waiting for transport upstairs.

## 2020-05-21 NOTE — ED Notes
SBAR HandoffSituation:	Admitting Diagnosis: The primary encounter diagnosis was Shortness of breath. Diagnoses of Abdominal pain, unspecified abdominal location and COPD exacerbation (HC Code) were also pertinent to this visit.Background:  Chief Complaint Patient presents with ? Shortness of Breath   Pt arrives with SOB and chills for the past three days. Pt reports I think I have another UTI. Pt denies chest pain or covid contacts. CELL [phone] ? Chills Isolation status: Not applicableAllergies:Allergies as of 05/21/2020 - Review Complete 05/21/2020 Allergen Reaction Noted ? Hydrocodone Unknown 11/29/2018 ? Percocet [oxycodone-acetaminophen] Other (See Comments) 02/28/2013 ? Duoneb [ipratropium-albuterol]  10/11/2018 Code status: FULL CODEAssessment:Vital signs:	Vitals:  05/21/20 1145 05/21/20 1442 05/21/20 1500 05/21/20 1600 BP: (!) 154/83 (!) 158/83 (!) 151/82 (!) 151/98 Pulse: 85 76   Resp: (!) 24 18   Temp:     TempSrc:     SpO2: 95% 97% 96% 94% Weight:     Medications ordered and administered in the Emergency Department:	Medications apixaban (ELIQUIS) tablet 5 mg (has no administration in time range) hydroCHLOROthiazide (HYDRODIURIL) tablet 25 mg (has no administration in time range) HYDROmorphone (DILAUDID) tablet 4 mg (has no administration in time range) pregabalin (LYRICA) capsule 50 mg (has no administration in time range) methylPREDNISolone PF (Solu-MEDROL PF) injection 40 mg (has no administration in time range) albuterol (PROVENTIL, VENTOLIN) nebulizer solution 1.25 mg/3 mL (0.042%) (has no administration in time range) LORazepam (ATIVAN) tablet 1 mg (has no administration in time range) sodium chloride 0.9 % (new bag) bolus 1,000 mL (0 mLs Intravenous Stopped 05/21/20 1136) iohexoL (OMNIPAQUE) 350 mg iodine/mL injection 70 mL (70 mLs Intravenous Given 05/21/20 1355) iohexoL (OMNIPAQUE) 350 mg iodine/mL injection 30 mL (30 mLs Intravenous Given 05/21/20 1356) methylPREDNISolone PF (Solu-MEDROL) injection 120 mg (120 mg Intravenous Given 05/21/20 1531) ibuprofen (ADVIL,MOTRIN) tablet 600 mg (600 mg Oral Given 05/21/20 1530) albuterol neb sol 2.5 mg/3 mL (0.083%) (PROVENTIL,VENTOLIN) (2.5 mg Nebulization Given 05/21/20 1531) Labs ordered and resulted in the Emergency Department.	Results for orders placed or performed during the hospital encounter of 05/21/20 SARS CoV-2 (COVID-19) RNA-[facility])  Specimen: Nasopharynx; Viral Result Value Ref Range  SARS-CoV-2 RNA (COVID-19) Not Detected Not Detected Blood culture  Specimen: Peripheral; Blood Result Value Ref Range  Blood Culture No Growth to Date  Blood culture  Specimen: Peripheral; Blood Result Value Ref Range  Blood Culture No Growth to Date  Protime-INR Result Value Ref Range  Prothrombin Time 11.3 9.4 - 12.0 seconds  INR 1.03 0.88 - 1.14 UA with culture reflex  Specimen: Urine Result Value Ref Range  Clarity, UA Clear Clear  Color, UA Yellow Yellow  Specific Gravity, UA 1.025 1.005 - 1.030  pH, UA 8.0 (H) 5.5 - 7.5  Protein, UA 1+ (A) Negative-Trace  Glucose, UA Negative Negative  Ketones, UA Negative Negative  Blood, UA Negative Negative  Bilirubin, UA Negative Negative  Leukocytes, UA Positive (A) Negative  Nitrite, UA Negative Negative  Urobilinogen, UA 2.0 <=2.0 EU/dL CBC auto differential Result Value Ref Range  WBC 8.8 3.8 - 10.6 x1000/?L  RBC 5.0 4.6 - 6.1 M/?L  Hemoglobin 15.2 13.7 - 18.0 g/dL  Hematocrit 09.8 11.9 - 54.0 %  MCV 90.6 80.0 - 99.0 fL  MCHC 33.7 32.0 - 36.0 g/dL  RDW-CV 14.7 82.9 - 56.2 %  Platelets 273 140 - 446 x1000/?L  MPV 9.7 (L) 9.8 - 12.3 fL  ANC (Abs Neutrophil Count) 6.4 1.8 - 7.3 x 1000/?L  Neutrophils 72.5 38.0 - 74.0 %  Lymphocytes 18.2 14.0 - 43.0 %  Absolute Lymphocyte Count 1.6 1.0 - 2.3 x 1000/?L Monocytes 4.9 0.0 - 14.0 %  Monocyte Absolute Count 0.4 0.4 - 1.3 x 1000/?L  Eosinophils 3.5 0.0 - 6.0 %  Eosinophil Absolute Count 0.3 0.0 - 0.4 x 1000/?L  Basophil 0.6 0.0 - 2.0 %  Basophil Absolute Count 0.1 0.0 - 0.6 x 1000/?L  Immature Granulocytes 0.3 0.0 - 2.0 %  Absolute Immature Granulocyte Count 0.0 0.0 - 0.2 x 1000/?L  nRBC 0.0 0.0 - 0.2 %  Absolute nRBC 0.0 <=0.0 x 1000/?L  MCH 30.5 25.7 - 31.0 pg Cardiac profile (Troponin I reflex CK/CKMB) Result Value Ref Range  Troponin I <0.015 0.000 - 1.500 ng/mL Comprehensive metabolic panel Result Value Ref Range  Sodium 145 136 - 145 mmol/L  Potassium 3.7 3.5 - 5.1 mmol/L  Chloride 110 95 - 115 mmol/L  CO2 27 21 - 32 mmol/L  Anion Gap 8 5 - 18  Glucose 124 (H) 70 - 100 mg/dL  BUN 17 8 - 25 mg/dL  Creatinine 1.61 0.96 - 1.30 mg/dL  Calcium 9.3 8.4 - 04.5 mg/dL  BUN/Creatinine Ratio 40.9 8.0 - 25.0  Total Protein 7.8 6.4 - 8.2 g/dL  Albumin 3.7 3.4 - 5.0 g/dL  Total Bilirubin 0.5 0.0 - 1.0 mg/dL  Alkaline Phosphatase 811 (H) 20 - 135 U/L  Alanine Aminotransferase (ALT) 63 12 - 78 U/L  Aspartate Aminotransferase (AST) 41 (H) 5 - 37 U/L  Globulin 4.1 g/dL  A/G Ratio 0.9   AST/ALT Ratio 0.7 See Comment  eGFR (Afr Amer) >60 >60 mL/min/1.75m2  eGFR (NON African-American) >60 >60 mL/min/1.57m2  Osmolality Calculation 292 275 - 295 mOsm/kg Lactic acid, plasma (reflex 2h repeat) Result Value Ref Range  Lactate 1.2 0.4 - 2.0 mmol/L Procalcitonin     (BH [facility] LMW Q YH) Result Value Ref Range  Procalcitonin 0.04 See Comment ng/mL C-reactive protein Result Value Ref Range  C-Reactive Protein 1.2 (H) 0.0 - 1.0 mg/dL Urine microscopic     ([facility] GH LMW YH) Result Value Ref Range  Bacteria, UA None None-Few /HPF  Epithelial Cells Few None-Few /LPF  Hyaline Casts, UA 4-10 (A) 0 - 3 /LPF  WBC/HPF 0-2 0 - 5 /HPF  RBC/HPF 6-10 (A) 0 - 2 /HPF D-dimer, quantitative Result Value Ref Range  D-Dimer 1.09 (H) <0.49 mg/L FEU EKG Result Value Ref Range  Heart Rate 78 bpm  QRS Duration 90 ms  Q-T Interval 402 ms  QTC Calculation(Bezet) 458 ms  P Axis 43 deg  R Axis 10 deg  T Axis 28 deg  P-R Interval 162 msec  ECG - SEVERITY Normal ECG severity Radiology studies done in ER: 	CTA Chest (PE) w IV Contrast Final Result   1. No pulmonary embolism.  2. Partial resection of the right lower lobe with abnormal soft tissue about the right lower lobe bronchovascular bundle. Neoplasm not excluded.  3. Emphysema.  4. Small right pleural effusion.  5. Fatty liver.  Reported and Signed by:  Antonieta Iba, MD  [place] Abdomen Pelvis w IV Contrast (No Oral) Final Result   1. Fatty liver.  2. Diverticulosis of the colon without bowel obstruction or inflammation.  3. Correlate with concurrent chest [place].    Reported and Signed by:  Antonieta Iba, MD  US Abdomen Limited ([place] Gall Bladder) Final Result Fatty liver. No acute finding.  Reported and Signed by:  Antonieta Iba, MD  XR Chest PA or AP Final Result Chronic changes in the right lung base.  Reported and Signed by:  Antonieta Iba, MD  IV access:	 hand left, condition patent, no redness and DDI		Mental status: Calm and CooperativeFunctional Status: Needs Assistance: fall riskCurrent pain level: 8Dysphagia screen performed: YesIs patient a fall risk: YesIs patient on oxygen: NoSitter ordered/needed? NoRecommendations:		Outstanding tests: No	Consults: No	Pending labs/meds/blood products: No	Brief plan/summary: being admitted for copd exac.

## 2020-05-22 LAB — CBC WITH AUTO DIFFERENTIAL
BKR WAM ABSOLUTE IMMATURE GRANULOCYTES: 0 x 1000/ÂµL (ref 0.0–0.2)
BKR WAM ABSOLUTE LYMPHOCYTE COUNT: 1.6 x 1000/ÂµL (ref 1.0–2.3)
BKR WAM ABSOLUTE NRBC: 0 x 1000/ÂµL (ref <=0.0)
BKR WAM ANALYZER ANC: 8.1 x 1000/ÂµL — ABNORMAL HIGH (ref 1.8–7.3)
BKR WAM BASOPHIL ABSOLUTE COUNT: 0 x 1000/ÂµL (ref 0.0–0.6)
BKR WAM BASOPHILS: 0.1 % (ref 0.0–2.0)
BKR WAM EOSINOPHIL ABSOLUTE COUNT: 0 x 1000/ÂµL (ref 0.0–0.4)
BKR WAM EOSINOPHILS: 0 % (ref 0.0–6.0)
BKR WAM HEMATOCRIT: 45.6 % (ref 41.0–54.0)
BKR WAM HEMOGLOBIN: 14.7 g/dL (ref 13.7–18.0)
BKR WAM IMMATURE GRANULOCYTES: 0.4 % (ref 0.0–2.0)
BKR WAM LYMPHOCYTES: 15.9 % (ref 14.0–43.0)
BKR WAM MCH (PG): 29.9 pg (ref 25.7–31.0)
BKR WAM MCHC: 32.2 g/dL (ref 32.0–36.0)
BKR WAM MCV: 92.9 fL (ref 80.0–99.0)
BKR WAM MONOCYTE ABSOLUTE COUNT: 0.5 x 1000/ÂµL (ref 0.4–1.3)
BKR WAM MONOCYTES: 4.6 % (ref 0.0–14.0)
BKR WAM MPV: 9.9 fL (ref 9.8–12.3)
BKR WAM NEUTROPHILS: 79 % — ABNORMAL HIGH (ref 38.0–74.0)
BKR WAM NUCLEATED RED BLOOD CELLS: 0 % (ref 0.0–0.2)
BKR WAM PLATELETS: 261 x1000/ÂµL (ref 140–446)
BKR WAM RDW-CV: 13.3 % (ref 11.5–14.5)
BKR WAM RED BLOOD CELL COUNT: 4.9 M/ÂµL (ref 4.6–6.1)
BKR WAM WHITE BLOOD CELL COUNT: 10.3 x1000/ÂµL (ref 3.8–10.6)

## 2020-05-22 LAB — MAGNESIUM: BKR MAGNESIUM: 2.1 mg/dL (ref 1.4–2.2)

## 2020-05-22 LAB — BASIC METABOLIC PANEL
BKR ANION GAP: 12 (ref 5–18)
BKR BLOOD UREA NITROGEN: 16 mg/dL (ref 8–25)
BKR BUN / CREAT RATIO: 22.2 (ref 8.0–25.0)
BKR CALCIUM: 9.4 mg/dL (ref 8.4–10.3)
BKR CHLORIDE: 106 mmol/L (ref 95–115)
BKR CO2: 22 mmol/L (ref 21–32)
BKR CREATININE: 0.72 mg/dL (ref 0.50–1.30)
BKR EGFR (AFR AMER): >60 mL/min/{1.73_m2} (ref >60)
BKR EGFR (NON AFRICAN AMERICAN): >60 mL/min/{1.73_m2} (ref >60)
BKR GLUCOSE: 127 mg/dL — ABNORMAL HIGH (ref 70–100)
BKR OSMOLALITY CALCULATION: 282 mosm/kg (ref 275–295)
BKR POTASSIUM: 3.9 mmol/L (ref 3.5–5.1)
BKR SODIUM: 140 mmol/L (ref 136–145)

## 2020-05-22 LAB — RESPIRATORY VIRUS PCR PANEL     (BH GH LMW Q YH)
BKR ADENOVIRUS: NOT DETECTED
BKR HUMAN METAPNEUMOVIRUS (HMPV): NOT DETECTED
BKR INFLUENZA A H1: NOT DETECTED
BKR INFLUENZA A H3: NOT DETECTED
BKR INFLUENZA A: NOT DETECTED
BKR INFLUENZA B: NOT DETECTED
BKR PARAINFLUENZA VIRUS 1: NOT DETECTED
BKR PARAINFLUENZA VIRUS 2: NOT DETECTED
BKR PARAINFLUENZA VIRUS 3: NOT DETECTED
BKR PARAINFLUENZA VIRUS 4: NOT DETECTED
BKR RESPIRATORY SYNCYTIAL VIRUS A: NOT DETECTED
BKR RESPIRATORY SYNCYTIAL VIRUS B: NOT DETECTED
BKR RHINOVIRUS: NOT DETECTED

## 2020-05-22 LAB — NT-PROBNPE: BKR B-TYPE NATRIURETIC PEPTIDE, PRO (PROBNP): 166 pg/mL (ref 0.0–900.0)

## 2020-05-22 MED ORDER — IPRATROPIUM 0.5 MG-ALBUTEROL 3 MG (2.5 MG BASE)/3 ML NEBULIZATION SOLN
0.5 mg-3 mg(2.5 mg base)/3 mL | Freq: Two times a day (BID) | RESPIRATORY_TRACT | Status: CP
Start: 2020-05-22 — End: ?
  Administered 2020-05-22: 14:00:00 0.5 mL via RESPIRATORY_TRACT

## 2020-05-22 MED ORDER — PANTOPRAZOLE 40 MG TABLET,DELAYED RELEASE
40 mg | Freq: Every day | ORAL | Status: DC
Start: 2020-05-22 — End: 2020-05-27
  Administered 2020-05-22 – 2020-05-27 (×6): 40 mg via ORAL

## 2020-05-22 MED ORDER — METHYLPREDNISOLONE SOD SUCC (PF) 40 MG/ML SOLUTION FOR INJECTION
40 mg/mL | Freq: Two times a day (BID) | INTRAVENOUS | Status: DC
Start: 2020-05-22 — End: 2020-05-24
  Administered 2020-05-23 – 2020-05-24 (×4): 40 mL via INTRAVENOUS

## 2020-05-22 MED ORDER — BUDESONIDE 0.5 MG/2 ML SUSPENSION FOR NEBULIZATION
0.52 mg/2 mL | Freq: Two times a day (BID) | RESPIRATORY_TRACT | Status: DC
Start: 2020-05-22 — End: 2020-05-27
  Administered 2020-05-23 – 2020-05-27 (×10): 0.5 mL via RESPIRATORY_TRACT

## 2020-05-22 MED ORDER — BUDESONIDE 0.5 MG/2 ML SUSPENSION FOR NEBULIZATION
0.5 mg/2 mL | Freq: Every day | RESPIRATORY_TRACT | Status: DC
Start: 2020-05-22 — End: 2020-05-22

## 2020-05-22 MED ORDER — IPRATROPIUM 0.5 MG-ALBUTEROL 3 MG (2.5 MG BASE)/3 ML NEBULIZATION SOLN
0.5-32.53 mg-3 mg(2.5 mg base)/3 mL | Freq: Two times a day (BID) | RESPIRATORY_TRACT | Status: DC
Start: 2020-05-22 — End: 2020-05-27
  Administered 2020-05-23 – 2020-05-27 (×10): 0.5 mL via RESPIRATORY_TRACT

## 2020-05-22 NOTE — Plan of Care
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 Workup in ED unremarkable but patient requiring 2L NC. Admitted for likely COPD exacerbation.TC to patient, he is alert and oriented x4.PLOF is independent with adls including driving.Denies falls and DME's at home.Patient is currently on O2 2l NC.Care Management will cont to assist with discharge needs.Case Management Assessment    Most Recent Value Case Management Assessment Do you have a caregiver?  No Patient Requires Care Coordination Intervention Due To  change in physical function, discharge planning needs/concerns Arrived from prior to admission  home Bed Hold   n/a Services Prior to Admission  none ADL Assistance  None Type of Home Care Services  None What equipment do you currently use at home?  none Documented Insurance Accurate  Yes Any financial concerns related to anticipated discharge needs  No Patient's home address verified  Yes Patient's PCP of record verified  Yes Last Date Seen by PCP  3-6 months Living Environment  Lives With  Alone Current Living Arrangements  home/apartment/condo Home Accessibility  no concerns with Licensed conveyancer Available  Medicaid transportation Home Safety Feels Safe Living In Home  yes Source of Clinical History Patient's clinical history has been reviewed and source of Information is:  Patient, Medical record Completed Assessment Completed Initial Assesment by: Name  CI Role:  Discharge Planner CM/SW Attestation: Choose which ONE is appropriate for you I have reviewed the medical record and completed the above evaluation with the following recommendations.  Yes Discharge Planning Coordination Recommendations Discharge Planning Coordination Recommendations Needs not determined at this time RN reviewed plan of care/ continuum of care need's with   Patient Cloti Shadman Tozzi RN BSNCare Manager and Utilization Review SpecialistYale Hamilton Endoscopy And [facility] LLC, EDMHB# [phone] Phone# [phone]

## 2020-05-22 NOTE — Utilization Review (ED)
UM Status: UR/MD agree on IP status Patient will remain min of 2 midnights, per md note as of today COPD exacerbation - possible viral.  Differential diagnosis including pulmonary edema / recurrence of giant cell interstitial pneumonia?Continue with IV Solu-MedrolAdd budesonide standing b.i.d.Sputum cultureCheck viral DFA panelCheck proBNPDr. Jimmie Molly consultTitrate oxygen down for sats < 94%?

## 2020-05-22 NOTE — Plan of Care
Plan of Care Overview/ Patient Status    Received pt @ 1900 in stable condition. A&Ox4. VSS on 2L. RTM with pulse ox in place. C/o abdominal/back pain, prn dilaudid administered with good effect. Meds administered per MAR. Pt c/o SOB, Albuterol neb administered by RT with good effect. OOB independently. Pt self-catheterized last night. Refused bed alarm. Safety precautions maintained. Call bell w/in reach.Problem: Adult Inpatient Plan of CareGoal: Plan of Care ReviewOutcome: Interventions implemented as appropriateGoal: Patient-Specific Goal (Individualized)Outcome: Interventions implemented as appropriateGoal: Absence of Hospital-Acquired Illness or InjuryOutcome: Interventions implemented as appropriateGoal: Optimal Comfort and WellbeingOutcome: Interventions implemented as appropriateGoal: Readiness for Transition of CareOutcome: Interventions implemented as appropriate Problem: Adjustment to Illness COPD (Chronic Obstructive Pulmonary Disease)Goal: Optimal Chronic Illness CopingOutcome: Interventions implemented as appropriate Problem: Functional Ability Impaired COPD (Chronic Obstructive Pulmonary Disease)Goal: Optimal Level of Functional IndependenceOutcome: Interventions implemented as appropriate Problem: Infection COPD (Chronic Obstructive Pulmonary Disease)Goal: Absence of Infection Signs and SymptomsOutcome: Interventions implemented as appropriate Problem: Oral Intake Inadequate COPD (Chronic Obstructive Pulmonary Disease)Goal: Improved Nutrition IntakeOutcome: Interventions implemented as appropriate Problem: Respiratory Compromise COPD (Chronic Obstructive Pulmonary Disease)Goal: Effective Oxygenation and VentilationOutcome: Interventions implemented as appropriate Problem: Gas Exchange ImpairedGoal: Optimal Gas ExchangeOutcome: Interventions implemented as appropriate Problem: Urinary RetentionGoal: Effective Urinary EliminationOutcome: Interventions implemented as appropriate

## 2020-05-22 NOTE — Plan of Care
[facility]		Spiritual Care NotePurpose: Referral Source: Chaplain Initiated Observation: People present/Information Obtained From: Patient Emotional Mood: Withdrawn, Isolated Quality of Relational Support: None or Very Little Types of Relational Support: Very Little   Subjective: Pt was awake and alert. 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 He stated several times regarding his religious practice that he just never got around to it. When offered, Pt was grateful to accept a Bible. Pt has little relational support locally, and appears to be isolated. He shared his story of illness with me. Pt expressed gratitude for the visit and the Bible. Spiritual Assessment:Referral Source: Chaplain Initiated Information Obtained From: Patient Mood: Withdrawn, IsolatedRelational SupportTypes of Relational Support: Very Ecologist Support: None or Very Little Religious Affiliation: Lutheran   Helpful Religious Practices: Scripture Reading    Issues RaisedRaised by Patient: Shared Story of Illness, Eager to Return Home, Lack of Support   Spiritual Interventions:Spiritual Intervention Index**Date of Spiritual Visit: 06/25/21Visit Type: Initial VisitLanguage or special accommodation rendered?: NoHow Well Do You Speak English?: very wellIntervention Type: Spiritual VisitResponding Chaplain: InternReligious Needs: BibleSpiritual/Religious Support Provided: Religious Materials Provided, Bible/Religious Reading, Companionship, Introduction to Chaplaincy ServicesOutcome: OUTCOMES: Expressed Spiritual/Religious Resources or Distress: Expressed gratitude, Identified priorities   OUTCOMES: Progressed Spiritually: Established chaplain relationship, Progressed toward a sense of purpose, Reconnected with previous faith practice/community OUTCOMES: Progressed Emotionally or Physically: Increased satisfaction     Plan: I informed the pt of the Spiritual Care dept services, and that he could ask for another visit from a chaplain if he desires. Follow up as needed.    	Signed: Estelle Grumbles, Chaplain Intern6/25/202111:36 AMPlan of Care Overview/ Patient Status

## 2020-05-22 NOTE — Plan of Care
Problem: Adult Inpatient Plan of CareGoal: Plan of Care ReviewOutcome: Interventions implemented as appropriateGoal: Patient-Specific Goal (Individualized)Outcome: Interventions implemented as appropriateGoal: Absence of Hospital-Acquired Illness or InjuryOutcome: Interventions implemented as appropriateGoal: Optimal Comfort and WellbeingOutcome: Interventions implemented as appropriateGoal: Readiness for Transition of CareOutcome: Interventions implemented as appropriate Plan of Care Overview/ Patient Status    Pt A&Ox4. VSS on 2LNC - SpO2 94%. Denies SOB at this time. C/o mild-moderate chronic back pain- received prn by night nurse this am, wctm. Pt states, feeling better today. No acute distress noted. Taking medications per MAR. RTM w/ O2 continued - NSR. Pt aware of sputum sample, wctm. Tolerating diet. Pt self-cath's himself. OOB independently. Safety precautions maintained, call bell w/in reach. Frequent rounding. Please see flowsheets and provider notes for additional information.

## 2020-05-22 NOTE — Other
Pulmonary ConsultIvar ADEN [name] is a 69 y.o. year old man, a former smoker of 1 PPD x 51 years known to our office. He has h/o resection of RLL lung noduleRLL recurrent pulmonary embolism on eliquisEmphysema on [place], but only mild obstructive dysfunction on PFTs and only on prn albuterol as an outpatient. Recently he has had increased sob that seems to correlate with when he inserts his dentursHe was admitted yesterday with weakness and sob for several days. He was mildly hypoxic and admitted for w/u.  Received steroids and albuterol.  Felt that the inhaler gave him great anxiety.  He is feeling a little better todayIvar J [name]  has a past medical history of Basal cell carcinoma, Blood transfusion, without reported diagnosis, BPH (benign prostatic hyperplasia), Concussion (04/2016), COPD (chronic obstructive pulmonary disease) (HC Code) (04/10/2015), Dupuytren's contracture of both hands, Fatty liver (10/14/2018), GERD (gastroesophageal reflux disease), Hematuria, History of gastric ulcer, Hypercholesteremia, Hypertension, Migraine, Neurogenic bladder, Osteoarthritis (09/19/2013), Renal colic, Respiratory arrest (HC Code) (04/2016), Sepsis (HC Code), Urinary problem, and UTI (urinary tract infection).[name]  has a past surgical history that includes anterior cervical fusion (03/2012); Mohs surgery; repair dupuytrens contracture (Bilateral); Arthroscopy shoulder w/ open rotator cuff repair (Right, 2012); Cervical spine surgery; and hand surgery (Right, 11/2019).[name]  reports that he quit smoking about 20 months ago. His smoking use included cigarettes. He smoked 0.25 packs per day. He has never used smokeless tobacco. He reports current alcohol use of about 1.0 - 2.0 standard drinks of alcohol per week. He reports that he does not use drugs.[name] family history includes Arthritis in his father and mother. [name] is allergic to hydrocodone; percocet [oxycodone-acetaminophen]; and duoneb [ipratropium-albuterol].Current Facility-Administered Medications: ?  albuterol (PROVENTIL, VENTOLIN) nebulizer solution 1.25 mg/3 mL (0.042%), 1.25 mg, Nebulization, Q6H PRN, Marland Mcalpine, Omair I, DO, 1.25 mg at 05/21/20 2101?  apixaban (ELIQUIS) tablet 5 mg, 5 mg, Oral, Q12H, Sheikh, Omair I, DO, 5 mg at 05/22/20 1610?  budesonide (PULMICORT) nebulizer solution 0.5 mg, 0.5 mg, Nebulization, Daily, Pallett, Dola Argyle, MD?  hydrALAZINE (APRESOLINE) injection/vial 5 mg, 5 mg, IV Push, Q4H PRN, Marland Mcalpine, Omair I, DO?  hydroCHLOROthiazide (HYDRODIURIL) tablet 25 mg, 25 mg, Oral, Daily, Sheikh, Omair I, DO, 25 mg at 05/22/20 9604?  HYDROmorphone (DILAUDID) tablet 4 mg, 4 mg, Oral, Q6H PRN, Sheikh, Omair I, DO, 4 mg at 05/22/20 0546?  LORazepam (ATIVAN) tablet 1 mg, 1 mg, Oral, Q6H PRN, Sheikh, Omair I, DO?  methylPREDNISolone PF (Solu-MEDROL PF) injection 40 mg, 40 mg, Intravenous, Q8H, Sheikh, Omair I, DO, 40 mg at 05/22/20 0548?  pantoprazole (PROTONIX) EC tablet 40 mg, 40 mg, Oral, Daily, Pallett, Dola Argyle, MD, 40 mg at 05/22/20 0845?  pregabalin (LYRICA) capsule 50 mg, 50 mg, Oral, BID, Sheikh, Omair I, DO, 50 mg at 05/22/20 5409?  sodium chloride 0.9 % flush 3 mL, 3 mL, IV Push, Q8H, Sheikh, Omair I, DO, 3 mL at 05/22/20 0549?  sodium chloride 0.9 % flush 3 mL, 3 mL, IV Push, PRN for [facility], Disney, [place] I, DO, 3 mL at 05/22/20 0848Temp:  [97.9 ?F (36.6 ?C)-98.2 ?F (36.8 ?C)] 98 ?F (36.7 ?C)Pulse:  [75-93] 75Resp:  [18-26] 20BP: (136-165)/(81-104) 136/85SpO2:  [93 %-97 %] 93 %Device (Oxygen Therapy): nasal cannulaO2 Flow (L/min):  [2] 2Alert and fully engaged.Mildly prolonged expiratory phaseNo wheezes, but there are rhonchi bilaterallyHeart is regularExtremities are without clubbing cyanosis or edema.Recent Labs   06/24/210828 06/25/210608 NA 145 140 K 3.7 3.9 CL  110 106 CO2 27 22 BUN 17 16 CREATININE 0.83 0.72 GLU 124* 127* Recent Labs   06/24/210828 06/25/210608 WBC 8.8 10.3 HGB 15.2 14.7 PLT 273 261  Recent Labs   06/24/210828 INR 1.03 Recent Labs   06/24/210827 06/24/210828 06/25/210608 CRP 1.2*  --   --  DDIMER  --  1.09*  --  BNPPRO  --   --  166.0 TROPONINI  --  <0.015  --  PROCALCITON 0.04  --   --  Results for orders placed or performed during the hospital encounter of 09/18/18 Blood gas, arterial Result Value Ref Range  Site N/A   pH Arterial 7.26 (L) 7.35 - 7.45 units  pCO2, Arterial 65 (HH) 35 - 45 mmHg  pO2, Arterial 63 (L) 75 - 100 mmHg  Calculated HCO3, Arterial 28.5 (H) 20.0 - 26.0 mmol/L  Base Excess, Arterial 0 -2 - 2 mmol/L  Patient Temperature 37.0 Celsius  O2 Sat, Arterial 86 (L) 95 - 97 %  Carboxyhemoglobin, Arterial 0.9 0.5 - 1.5 %  Methemoglobin, Arterial 1.0 0.0 - 1.5 % PFT 09/28/2020Vital capacity is normal.Flow rates are reduced relative to vital capacity.There is no significant response to inhaled bronchodilators at the time of the study.Fractional excretion of nitric oxide is low at seven parts per billion.Assessment:Mild obstructive airways dysfunction.Compared to the prior study of 12/27/2017 flow rates are perhaps slightly lower.Diffusion on prior PFT was 61% predictedResults for orders placed or performed during the hospital encounter of 01/30/20 Echo 2D Complete w Doppler and CFI if Ind Image Enhancement 3D and or bubbles Result Value Ref Range  Reported Visual Range EF% 55-60 %  Narrative   * Technically very limited* Normal left ventricular size and systolic function. Moderate concentric left ventricular hypertrophy.  LVEF estimated by visual assessment was between 55-60%.  Normal diastolic function and filling pressures. * Normal right ventricular cavity size.  Mildly decreased right ventricular systolic function on contrast images.  Estimated right ventricular systolic pressure is 26 mmHg.  TAPSE 2.0 cm- low nl.* Aortic valve was not well visualized.* Normal mitral valve leaflets.  No mitral regurgitation.  No mitral stenosis.* Tricuspid valve was not well visualized.* Pulmonic valve was not well visualized.* All visible segments of the aorta are normal in size.* IVC diameter < 2.1 cm that collapses < 50% with a sniff suggests mildly increased RAP (5-10 mmHg, mean 8 mmHg).* No evidence of pericardial effusion. Xr Chest Pa Or ApResult Date: 6/24/2021Chronic changes in the right lung base.  Reported and Signed by:  Antonieta Iba, MDResults for orders placed or performed during the hospital encounter of 05/21/20 CTA Chest (PE) w IV Contrast  Narrative  Clinical information: Male, 69 years old, PE suspected, low/intermediate prob, positive D-dimerSOB.Technique: CTA CHEST (PE) W IV CONTRAST.70 cc of Omni 350 was injected intravenously.Radiation dose reduction was achieved using automated exposure control and/or patient sized based adjustment of MA and/or kV and/or the use of iterative reconstruction.Comparison: 01/30/2020.Findings: The heart is normal in size without pericardial effusion. There is no pulmonary embolism. There is no mediastinal adenopathy. The patient is status post partial resection of the right lower lobe. There is abnormal soft tissue about the bronchovascular bundle to the right lower lobe (series 7 image 78) and the possibility of neoplasm is not excluded and follow-up is recommended. There is emphysema. There is no consolidation. There is a small right-sided pleural effusion. Images of the upper abdomen demonstrate fatty infiltration of the liver. The bones are intact.  Impression  1. No pulmonary embolism. 2.  Partial resection of the right lower lobe with abnormal soft tissue about the right lower lobe bronchovascular bundle. Neoplasm not excluded.3. Emphysema.4. Small right pleural effusion.5. Fatty liver.Reported and Signed by:  Antonieta Iba, MD Results for orders placed or performed during the hospital encounter of 07/26/19 [place] chest with IV contrast  Narrative  Examination: [place] CHEST W IV CONTRAST, 07/26/2019 7:45 AMClinical indication: Follow-up 1. Giant cell interstitial pneumonia following resection 2. Right lower lobe Pulmonary artery thrombosisTechnique: After the intravenous administration of 70 mL of Omnipaque 350 a postcontrast [place] of the chest was performed. Individual dose optimization technique was used for the performance of this procedure, and one or more of the following dose reduction techniques was used: Automated exposure control and/or adjustment of the MA and/or kV according to patient size and/or the use of iterative reconstruction technique.Comparison: November 14, 2019Findings:    Chest:    The prior right lower lobe segmental embolus is not seen though study is not optimized for evaluation of pulmonary emboli. There is no central embolus.Centrilobular emphysema with upper lobe predominance is unchanged. There are expected postsurgical changes at the right base. There is some stable right hilar nodal prominence which is subcentimeter in short axis, series 7 image 60. Attention on follow-up is recommended. There are no suspicious pulmonary nodules.  The central airways are patent. No mediastinal adenopathy. Heart is normal in size. No axillary adenopathy.Limited evaluation of the upper abdomen is unremarkable. There are no suspicious osseous lesions.  Impression  Impression: 1.  Expected postsurgical change at the right base without evidence of local recurrence. Some right hilar nodal prominence is stable, possibly reactive, attention on follow-up recommended.2.  Prior right lower lobe segmental embolus is not seen. No central pulmonary embolus.3.  Extensive upper lobe predominant emphysemaReported and Signed by:  Lonna Cobb, MD Results for orders placed or performed during the hospital encounter of 09/17/18 [place] Chest wo IV Contrast  Narrative  [place] chest noncontrastCLINICAL INFORMATION: Follow-up nodule, is it vascular?, Super DCOMPARISON: 08/23/2018 and 07/05/2017.TECHNIQUE: Multislice spiral acquisition was performed using the Super D protocol and multiplanar reformatting. Individual dose optimization technique was used for the performance of this procedure, and one or more of the following dose reduction techniques was used: Automated exposure control and/or adjustment of the MA and/or kV according to patient size and/or the use of iterative reconstruction technique.FINDINGS: An oval nodule is seen at a bifurcation of a peripheral vessel in the superior segment of the right lower lobe, measuring currently about 8 mm in maximum AP diameter, unchanged from most recent prior study,, measured at about 7 mm 07/05/2017. The nodule is smooth, noncalcified, without irregular or spiculated margins.No other pulmonary nodule, mass or infiltrate is seen. Moderate emphysema is most pronounced in the upper lobes. No primary interstitial lung disease is seen. There is no pleural fluid or pneumothorax. The trachea and central airways are unremarkable. A few small lymph nodes in the middle mediastinum are unchanged. No mediastinal mass is identified. Scattered atherosclerotic calcification in the wall of the aorta is noted and a few scattered coronary arterial calcifications are noted. There is no pericardial fluid. No esophageal dilation or wall thickening is seen.Scans continued into the upper abdomen reveal no adrenal enlargement. The visualized liver and the spleen reveal no specific abnormalities. There are no other specific abnormalities or changes from previous examination identified.  Impression  IMPRESSION: A nodule in the superior segment of the right lower lobe reveals no significant change when compared with previous recent study, possibly increased  1 mm since prior study 07/05/2017.Reported and Signed by:  Hal Neer, MD AUnderlying emphysemaRecurrent RLL pulmonary artery thrombosis on eliquisStatus post thoracotomy for resection of a benign lung nodule.Now with copd exacerbation--perhaps related to epoxy exposure from his dentures. No evidence of pneumonia on [place] scan. ?P Steroids -- would decrease dose to solumedrol 40mg  q 12 hours--taper systemic steroid as quickly as possible as may be contributing to his anxiety. Continue budesonide and can give duonebs twice a day with additional albuterol prnWe have avoided anticholinergics due to his neurogenic bladder--short acting twice a day should be fine. Begin symbicort BID with spacerContinue eliquisOOB/ambulateWill need f/u [place] as an outpatient to monitor the RLL Johnnette Gourd, MDPager [phone] call with any questions. Dr. Dorthy Cooler  is covering for pulmonary issues over the weekend. Please call with any questions or concerns. Dr. Billie Ruddy will take over our hospitalized patients next week.
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
## 2020-05-23 LAB — CBC WITH AUTO DIFFERENTIAL
BKR WAM ABSOLUTE IMMATURE GRANULOCYTES: 0.1 x 1000/ÂµL (ref 0.0–0.2)
BKR WAM ABSOLUTE LYMPHOCYTE COUNT: 1.5 x 1000/ÂµL (ref 1.0–2.3)
BKR WAM ABSOLUTE NRBC: 0 x 1000/ÂµL (ref <=0.0)
BKR WAM ANALYZER ANC: 11.4 x 1000/ÂµL — ABNORMAL HIGH (ref 1.8–7.3)
BKR WAM BASOPHIL ABSOLUTE COUNT: 0 x 1000/ÂµL (ref 0.0–0.6)
BKR WAM BASOPHILS: 0.1 % (ref 0.0–2.0)
BKR WAM EOSINOPHIL ABSOLUTE COUNT: 0 x 1000/ÂµL (ref 0.0–0.4)
BKR WAM EOSINOPHILS: 0 % (ref 0.0–6.0)
BKR WAM HEMATOCRIT: 44.1 % (ref 41.0–54.0)
BKR WAM HEMOGLOBIN: 14.6 g/dL (ref 13.7–18.0)
BKR WAM IMMATURE GRANULOCYTES: 0.5 % (ref 0.0–2.0)
BKR WAM LYMPHOCYTES: 11.1 % — ABNORMAL LOW (ref 14.0–43.0)
BKR WAM MCH (PG): 30.2 pg (ref 25.7–31.0)
BKR WAM MCHC: 33.1 g/dL (ref 32.0–36.0)
BKR WAM MCV: 91.3 fL (ref 80.0–99.0)
BKR WAM MONOCYTE ABSOLUTE COUNT: 0.4 x 1000/ÂµL (ref 0.4–1.3)
BKR WAM MONOCYTES: 3.3 % (ref 0.0–14.0)
BKR WAM MPV: 10 fL (ref 9.8–12.3)
BKR WAM NEUTROPHILS: 85 % — ABNORMAL HIGH (ref 38.0–74.0)
BKR WAM NUCLEATED RED BLOOD CELLS: 0 % (ref 0.0–0.2)
BKR WAM PLATELETS: 285 x1000/ÂµL (ref 140–446)
BKR WAM RDW-CV: 13.6 % (ref 11.5–14.5)
BKR WAM RED BLOOD CELL COUNT: 4.8 M/ÂµL (ref 4.6–6.1)
BKR WAM WHITE BLOOD CELL COUNT: 13.4 x1000/ÂµL — ABNORMAL HIGH (ref 3.8–10.6)

## 2020-05-23 LAB — BASIC METABOLIC PANEL
BKR ANION GAP: 10 (ref 5–18)
BKR BLOOD UREA NITROGEN: 24 mg/dL (ref 8–25)
BKR BUN / CREAT RATIO: 27.6 — ABNORMAL HIGH (ref 8.0–25.0)
BKR CALCIUM: 9.2 mg/dL (ref 8.4–10.3)
BKR CHLORIDE: 106 mmol/L (ref 95–115)
BKR CO2: 23 mmol/L (ref 21–32)
BKR CREATININE: 0.87 mg/dL (ref 0.50–1.30)
BKR EGFR (AFR AMER): >60 mL/min/{1.73_m2} (ref >60)
BKR EGFR (NON AFRICAN AMERICAN): >60 mL/min/{1.73_m2} (ref >60)
BKR GLUCOSE: 135 mg/dL — ABNORMAL HIGH (ref 70–100)
BKR OSMOLALITY CALCULATION: 284 mosm/kg (ref 275–295)
BKR POTASSIUM: 3.9 mmol/L (ref 3.5–5.1)
BKR SODIUM: 139 mmol/L (ref 136–145)

## 2020-05-23 LAB — URINE CULTURE: BKR URINE CULTURE, ROUTINE: NO GROWTH

## 2020-05-23 LAB — C-REACTIVE PROTEIN     (CRP): BKR C-REACTIVE PROTEIN: 0.4 mg/dL (ref 0.0–1.0)

## 2020-05-23 MED ORDER — AZITHROMYCIN 250 MG TABLET
250 mg | Freq: Every day | ORAL | Status: CP
Start: 2020-05-23 — End: ?
  Administered 2020-05-23: 17:00:00 250 mg via ORAL

## 2020-05-23 MED ORDER — AZITHROMYCIN 250 MG TABLET
250 mg | Freq: Every day | ORAL | Status: CP
Start: 2020-05-23 — End: ?
  Administered 2020-05-24 – 2020-05-27 (×4): 250 mg via ORAL

## 2020-05-23 NOTE — Progress Notes
[facility] Medicine Progress NoteAttending Provider: Youlanda Mighty, MD  Subjective                                                                              Subjective: Interim History: Patient tells me that he started wheezing last night.Points to his throat, and says that most of the wheezing is up in his throatReviewed history.Patient had an EGD at [facility] a couple weeks ago and was told everything looked good.  Noted in Care everywhere to have GERD without esophagitis associated with the June 15 EGD/colonoscopy.   We also discussed the wheezing that he currently feels in his throat.  He tells me that he does periodically feel like something gets stuck in his throat, this is occurred ever since undergoing a cervical fusion about 10 years ago.  He has never seen an ear nose and throat doctor for this, though he did have the above EGD as noted.Review of Allergies/Meds/Hx: Review of Allergies/Meds/Hx:I have reviewed the patient's: current scheduled medications, current infusions, current prn medications, past medical history and past surgical history Objective Objective: Vitals:Last 24 hours: Temp:  [97.5 ?F (36.4 ?C)-98.3 ?F (36.8 ?C)] 97.6 ?F (36.4 ?C)Pulse:  [72-91] 72Resp:  [16-20] 20BP: (126-150)/(71-81) 126/76SpO2:  [93 %-95 %] 95 %I/O's:Gross Totals (Last 24 hours) at 05/23/2020 1141Last data filed at 05/23/2020 0852Intake 480 ml Output -- Net 480 ml Procedures:None Physical Exam Well-nourished well-developed, sitting up in bed with nasal cannula oxygen in placeNeck supple, no JVD, no lymphadenopathyLungs with good air movement diffusely, scattered rhonchi, no wheezing.  Faint upper airway wheezing noted.Cardiovascular, regular rate, normal S1-S2, no murmurs rubs or gallops.Abdomen soft, nontender nondistendedExtremities warm well perfusedLabs:Last 24 hours: Recent Results (from the past 24 hour(s)) C-reactive protein  Collection Time: 05/23/20  7:09 AM Result Value Ref Range  C-Reactive Protein 0.4 0.0 - 1.0 mg/dL CBC auto differential  Collection Time: 05/23/20  7:09 AM Result Value Ref Range  WBC 13.4 (H) 3.8 - 10.6 x1000/?L  RBC 4.8 4.6 - 6.1 M/?L  Hemoglobin 14.6 13.7 - 18.0 g/dL  Hematocrit 60.4 54.0 - 54.0 %  MCV 91.3 80.0 - 99.0 fL  MCHC 33.1 32.0 - 36.0 g/dL  RDW-CV 98.1 19.1 - 47.8 %  Platelets 285 140 - 446 x1000/?L  MPV 10.0 9.8 - 12.3 fL  ANC (Abs Neutrophil Count) 11.4 (H) 1.8 - 7.3 x 1000/?L  Neutrophils 85.0 (H) 38.0 - 74.0 %  Lymphocytes 11.1 (L) 14.0 - 43.0 %  Absolute Lymphocyte Count 1.5 1.0 - 2.3 x 1000/?L  Monocytes 3.3 0.0 - 14.0 %  Monocyte Absolute Count 0.4 0.4 - 1.3 x 1000/?L  Eosinophils 0.0 0.0 - 6.0 %  Eosinophil Absolute Count 0.0 0.0 - 0.4 x 1000/?L  Basophil 0.1 0.0 - 2.0 %  Basophil Absolute Count 0.0 0.0 - 0.6 x 1000/?L  Immature Granulocytes 0.5 0.0 - 2.0 %  Absolute Immature Granulocyte Count 0.1 0.0 - 0.2 x 1000/?L  nRBC 0.0 0.0 - 0.2 %  Absolute nRBC 0.0 <=0.0 x 1000/?L  MCH 30.2 25.7 - 31.0 pg Basic metabolic panel  Collection Time: 05/23/20  7:09 AM Result Value Ref Range  Sodium 139 136 - 145 mmol/L  Potassium 3.9 3.5 -  5.1 mmol/L  Chloride 106 95 - 115 mmol/L  CO2 23 21 - 32 mmol/L  Anion Gap 10 5 - 18  Glucose 135 (H) 70 - 100 mg/dL  BUN 24 8 - 25 mg/dL  Creatinine 1.61 0.96 - 1.30 mg/dL  Calcium 9.2 8.4 - 04.5 mg/dL  BUN/Creatinine Ratio 40.9 (H) 8.0 - 25.0  Osmolality Calculation 284 275 - 295 mOsm/kg  eGFR (Afr Amer) >60 >60 mL/min/1.44m2  eGFR (NON African-American) >60 >60 mL/min/1.46m2 Diagnostics:05/21/20 Chest [place] reviewed: IMPRESSION:??1. No pulmonary embolism.?2. Partial resection of the right lower lobe with abnormal soft tissue about the right lower lobe bronchovascular bundle. Neoplasm not excluded.?3. Emphysema.?4. Small right pleural effusion.?5. 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  Unclear precipitant.  No e/o pneumonia, viral PCR panel negative.  Today, indicates that he has had some swallowing difficulties as well with a sensation of pills/food getting stuck in his throat.  Recent EGD without overt pathology (GERD without esophagitis), per report available.   I have reviewed the patient's problem list and updated it as needed. Plan Plan: COPD exacerbation -Continue with IV solumedrol reduced to BID dosing-Continue with duonebs BID scheduled with albuterol PRN additional wheezing (minimize anticholinergics given hx of neurogenic bladder).  -Pulm consult appreciated-Wean O2 as tolerated-Given swallowing history reviewed above, would modify diet to chopped/thin liquids and request SLP.  Patient may benefit from FEES study.  -CRP now down to 0.4 (WBC elevation noted, ? Steroid effec)Elevated alk phos, chronic, mildly progressive-Repeat LFTs in AM-Send GGT -Abd u/s reviewed c/w fatty liver, no acute finding.  ?Chronic painContinue with Lyrica and Dilaudid?HypertensionContinue hydrochlorothiazide 25 mg's daily.  BP well controlled.  K wnl?Pulmonary embolismContinue with Eliquis 5 mg's b.i.d.?Code:  FullDiet:  Regular Electronically Signed:Camara Renstrom Shanna Cisco, MD Beeper 38746/26/2021, 8:27 AM

## 2020-05-23 NOTE — Plan of Care
Plan of Care Overview/ Patient Status    Vitals:Temperature: 97.6 ?F (36.4 ?C)Pulse: 72Respirations: 20Blood Pressure: 126/76Oxygen Saturation: 95 %Patient is A+Ox4, OOB independent, no complaints of weakness or N/V. Pt does complain of 8/10 back pain, PRN dilaudid given. Appetite and fluid intake adequate. Skin intact. PIV patent dressing dry and intact. RTM with pulse ox continued. Call bell and belongings within reach, safety maintained. Problem: Adult Inpatient Plan of CareGoal: Plan of Care ReviewOutcome: Interventions implemented as appropriateGoal: Patient-Specific Goal (Individualized)Outcome: Interventions implemented as appropriateGoal: Absence of Hospital-Acquired Illness or InjuryOutcome: Interventions implemented as appropriateGoal: Optimal Comfort and WellbeingOutcome: Interventions implemented as appropriateGoal: Readiness for Transition of CareOutcome: Interventions implemented as appropriate Problem: Adjustment to Illness COPD (Chronic Obstructive Pulmonary Disease)Goal: Optimal Chronic Illness CopingOutcome: Interventions implemented as appropriate Problem: Functional Ability Impaired COPD (Chronic Obstructive Pulmonary Disease)Goal: Optimal Level of Functional IndependenceOutcome: Interventions implemented as appropriate Problem: Infection COPD (Chronic Obstructive Pulmonary Disease)Goal: Absence of Infection Signs and SymptomsOutcome: Interventions implemented as appropriate Problem: Oral Intake Inadequate COPD (Chronic Obstructive Pulmonary Disease)Goal: Improved Nutrition IntakeOutcome: Interventions implemented as appropriate Problem: Respiratory Compromise COPD (Chronic Obstructive Pulmonary Disease)Goal: Effective Oxygenation and VentilationOutcome: Interventions implemented as appropriate Problem: Gas Exchange ImpairedGoal: Optimal Gas ExchangeOutcome: Interventions implemented as appropriate Problem: Urinary RetentionGoal: Effective Urinary EliminationOutcome: Interventions implemented as appropriate Problem: Fall Injury RiskGoal: Absence of Fall and Fall-Related InjuryOutcome: Interventions implemented as appropriate

## 2020-05-23 NOTE — Progress Notes
DAILY PROGRESS NOTEPatient: Henry Eickhoff HelgesonDate: 6/26/2021History limited by: no limitationsSUBJECTIVE:No acute events.  Breathing feels stable.  Has occasional wheeze.  Remains on oxygen support.VITALS:BP: (126-150)/(71-81) 126/76 (06/26 0739)Temp:  [97.5 ?F (36.4 ?C)-98.3 ?F (36.8 ?C)] 97.6 ?F (36.4 ?C) (06/25 1206-06/26 0739)Pulse:  [72-91] 72 (06/26 0739)SpO2:  [93 %-95 %] 95 % (06/26 0852)Resp:  [16-20] 20 (06/26 0739)I/O: Gross Totals (Last 24 hours) at 05/23/2020 1206Last data filed at 05/23/2020 0852Intake 480 ml Output -- Net 480 ml PHYSICAL EXAM: GEN; WDWN, NADNeuro; Alert, oriented, with nonfocal examHEENT; NCAT, EOMINeck; No bruits, thyromegaly or JVDChest; Symmetric respirationsLungs; CTA, prolonged breath soundsHeart; RRR without murmursAbd; Soft, NTND, normoactive bowel sounds.Ext; no cyanosis, clubbing or edema.Skin; no rashes, wounds or lesions.Review of Systems: 10 point review of systems completed, all pertinent positives listedASSESSMENT/PLAN: 69 yo M, approx 50 pack yr hx, hx of COPD, emphysematous lung changes, hx of RLL PE on eliquis, HTN, neurogenic bladder, admitted with COPD exacerbation, acute respiratory failureCOPD exacerbation:- standing nebulizers, and Pulmicort; restart ICS-LABA on on discharge- continue Solu-Medrol 40 q.12h- give azithromycin, 5 day course- monitor oxygen, keep greater than 90%, out of bed to chair, ambulateOn EliquisPt to follow with Dr Billie Ruddy after discharge  Attending: Youlanda Mighty, MDDr. Tenny Craw MazoDate: 6/26/2021Time: 12:06 PMBeeper: #[id number] - DATA - DIAGNOSTICS: LABORATORIES:I have reviewed the patient's labs within the last 24 hrs.DIAGNOSTICS:cxrCURRENT MEDICATIONS: Current Facility-Administered Medications Medication Dose Route Frequency Provider Last Rate Last Admin ? apixaban (ELIQUIS) tablet 5 mg  5 mg Oral Q12H Sheikh, [place] I, DO   5 mg at 05/23/20 0454 ? budesonide (PULMICORT) nebulizer solution 0.5 mg  0.5 mg Nebulization BID Adrienne Mocha, MD   0.5 mg at 05/23/20 0847 ? hydroCHLOROthiazide (HYDRODIURIL) tablet 25 mg  25 mg Oral Daily Sheikh, Kateri Mc I, DO   25 mg at 05/23/20 0981 ? ipratropium-albuteroL (DUO-NEB) 0.5 mg-3 mg(2.5 mg base)/3 mL nebulizer solution 3 mL  3 mL Nebulization Q12H Pallett, Dola Argyle, MD   3 mL at 05/23/20 0847 ? methylPREDNISolone PF (Solu-MEDROL PF) injection 40 mg  40 mg Intravenous Q12H Robet Leu, MD   40 mg at 05/23/20 0836 ? pantoprazole (PROTONIX) EC tablet 40 mg  40 mg Oral Daily Pallett, Dola Argyle, MD   40 mg at 05/23/20 0836 ? pregabalin (LYRICA) capsule 50 mg  50 mg Oral BID Marguerita Merles I, DO   50 mg at 05/23/20 1914 ? sodium chloride 0.9 % flush 3 mL  3 mL IV Push Q8H Sheikh, Omair I, DO   3 mL at 05/23/20 0836  hydrALAZINE, HYDROmorphone, LORazepam, sodium chloride

## 2020-05-23 NOTE — Plan of Care
Plan of Care Overview/ Patient Status    Assumed care at 19:00; Patient is A&O x4; on 2L of O2 via nasal cannula; OOB independently;  Heplock in place on left hand, no fluids running per MAR; on RTM with pulse ox; Patient self-cath's himself; Patient complained of back pain, PO dilaudid given, with great effect; Meds given per [facility]; vital signs as documented; Patient refused bed alarm; call bell and belongings within reach;  Sputum culture still needs to be collected, patient is aware; will continue monitoring. Problem: Adult Inpatient Plan of CareGoal: Plan of Care ReviewOutcome: Interventions implemented as appropriateGoal: Patient-Specific Goal (Individualized)Outcome: Interventions implemented as appropriateGoal: Absence of Hospital-Acquired Illness or InjuryOutcome: Interventions implemented as appropriateGoal: Optimal Comfort and WellbeingOutcome: Interventions implemented as appropriate Problem: Adjustment to Illness COPD (Chronic Obstructive Pulmonary Disease)Goal: Optimal Chronic Illness CopingOutcome: Interventions implemented as appropriate Problem: Functional Ability Impaired COPD (Chronic Obstructive Pulmonary Disease)Goal: Optimal Level of Functional IndependenceOutcome: Interventions implemented as appropriate Problem: Infection COPD (Chronic Obstructive Pulmonary Disease)Goal: Absence of Infection Signs and SymptomsOutcome: Interventions implemented as appropriate Problem: Oral Intake Inadequate COPD (Chronic Obstructive Pulmonary Disease)Goal: Improved Nutrition IntakeOutcome: Interventions implemented as appropriate Problem: Respiratory Compromise COPD (Chronic Obstructive Pulmonary Disease)Goal: Effective Oxygenation and VentilationOutcome: Interventions implemented as appropriate Problem: Gas Exchange ImpairedGoal: Optimal Gas ExchangeOutcome: Interventions implemented as appropriate Problem: Urinary RetentionGoal: Effective Urinary EliminationOutcome: Interventions implemented as appropriate

## 2020-05-24 ENCOUNTER — Inpatient Hospital Stay: Admit: 2020-05-24 | Payer: PRIVATE HEALTH INSURANCE

## 2020-05-24 ENCOUNTER — Inpatient Hospital Stay: Admit: 2020-05-24 | Payer: PRIVATE HEALTH INSURANCE | Attending: Diagnostic Radiology

## 2020-05-24 DIAGNOSIS — R0602 Shortness of breath: Secondary | ICD-10-CM

## 2020-05-24 LAB — COMPREHENSIVE METABOLIC PANEL
BKR A/G RATIO: 0.9
BKR ALANINE AMINOTRANSFERASE (ALT): 76 U/L (ref 12–78)
BKR ALBUMIN: 3.5 g/dL (ref 3.4–5.0)
BKR ALKALINE PHOSPHATASE: 245 U/L — ABNORMAL HIGH (ref 20–135)
BKR ANION GAP: 11 (ref 5–18)
BKR ASPARTATE AMINOTRANSFERASE (AST): 37 U/L (ref 5–37)
BKR AST/ALT RATIO: 0.5
BKR BILIRUBIN TOTAL: 0.4 mg/dL (ref 0.0–1.0)
BKR BLOOD UREA NITROGEN: 25 mg/dL (ref 8–25)
BKR BUN / CREAT RATIO: 27.5 — ABNORMAL HIGH (ref 8.0–25.0)
BKR CALCIUM: 9.1 mg/dL (ref 8.4–10.3)
BKR CHLORIDE: 107 mmol/L (ref 95–115)
BKR CO2: 23 mmol/L (ref 21–32)
BKR CREATININE: 0.91 mg/dL (ref 0.50–1.30)
BKR EGFR (AFR AMER): >60 mL/min/{1.73_m2} (ref >60)
BKR EGFR (NON AFRICAN AMERICAN): >60 mL/min/{1.73_m2} (ref >60)
BKR GLOBULIN: 3.8 g/dL
BKR GLUCOSE: 122 mg/dL — ABNORMAL HIGH (ref 70–100)
BKR OSMOLALITY CALCULATION: 287 mosm/kg (ref 275–295)
BKR POTASSIUM: 4.1 mmol/L (ref 3.5–5.1)
BKR PROTEIN TOTAL: 7.3 g/dL (ref 6.4–8.2)
BKR SODIUM: 141 mmol/L (ref 136–145)

## 2020-05-24 LAB — CBC WITHOUT DIFFERENTIAL
BKR WAM ANALYZER ANC: 10.6 x 1000/ÂµL — ABNORMAL HIGH (ref 1.8–7.3)
BKR WAM HEMATOCRIT: 47.2 % (ref 41.0–54.0)
BKR WAM HEMOGLOBIN: 14.9 g/dL (ref 13.7–18.0)
BKR WAM MCH (PG): 29.6 pg (ref 25.7–31.0)
BKR WAM MCHC: 31.6 g/dL — ABNORMAL LOW (ref 32.0–36.0)
BKR WAM MCV: 93.7 fL (ref 80.0–99.0)
BKR WAM MPV: 10.4 fL (ref 9.8–12.3)
BKR WAM PLATELETS: 247 x1000/ÂµL (ref 140–446)
BKR WAM RDW-CV: 13.7 % (ref 11.5–14.5)
BKR WAM RED BLOOD CELL COUNT: 5 M/ÂµL (ref 4.6–6.1)
BKR WAM WHITE BLOOD CELL COUNT: 12.9 x1000/ÂµL — ABNORMAL HIGH (ref 3.8–10.6)

## 2020-05-24 LAB — GAMMA GT: BKR GAMMA GLUTAMYL TRANSFERASE: 69 U/L (ref 15–85)

## 2020-05-24 LAB — PROCALCITONIN     (BH GH LMW Q YH): BKR PROCALCITONIN: <0.04 ng/mL

## 2020-05-24 LAB — C-REACTIVE PROTEIN     (CRP): BKR C-REACTIVE PROTEIN: <0.3 mg/dL (ref 0.0–1.0)

## 2020-05-24 MED ORDER — PREDNISONE 20 MG TABLET
20 mg | Freq: Every day | ORAL | Status: DC
Start: 2020-05-24 — End: 2020-05-27
  Administered 2020-05-25 – 2020-05-27 (×3): 20 mg via ORAL

## 2020-05-24 NOTE — Progress Notes
DAILY PROGRESS NOTEPatient: Henry Pollack HelgesonDate: 6/27/2021History limited by: no limitationsSUBJECTIVE:No acute events, continues to feel dyspnic, minimal coughVITALS:BP: (119-136)/(72-83) 120/72 (06/27 0741)Temp:  [97.6 ?F (36.4 ?C)-97.8 ?F (36.6 ?C)] 97.6 ?F (36.4 ?C) (06/26 1231-06/27 0741)Pulse:  [70-82] 70 (06/27 0741)SpO2:  [16 %-94 %] 93 % (06/27 0850)Resp:  [18-20] 20 (06/27 0741)I/O: No intake or output data in the 24 hours ending 05/24/20 1231PHYSICAL EXAM: GEN; WDWN, NADNeuro; Alert, oriented, with nonfocal examHEENT; NCAT, EOMINeck; No bruits, thyromegaly or JVDChest; Symmetric respirationsLungs; scant rhonchiHeart; RRR without murmursAbd; Soft, NTND, normoactive bowel sounds.Ext; no cyanosis, clubbing or edema.Skin; no rashes, wounds or lesions.Review of Systems: 10 point review of systems completed, all pertinent positives listedASSESSMENT/PLAN: 69 yo M, approx 50 pack yr hx, hx of COPD, emphysematous lung changes, hx of RLL PE on eliquis, HTN, neurogenic bladder, admitted with COPD exacerbation, acute respiratory failure?COPD exacerbation:- standing nebulizers, and Pulmicort; cont ICS-LABA on on discharge- change solumedrol to po prednisone, 40mg  po daily, adjust with symptoms- give azithromycin, 5 day course- monitor oxygen, keep greater than 90%, out of bed to chair, ambulate, taper off O2On Eliquis?Pt to follow with Dr Billie Ruddy after discharge  Attending: Youlanda Mighty, MDDr. Tenny Craw MazoDate: 6/27/2021Time: 12:31 PMBeeper: #[id number] - DATA - DIAGNOSTICS: LABORATORIES:I have reviewed the patient's labs within the last 24 hrs.DIAGNOSTICS:cxrCURRENT MEDICATIONS: Current Facility-Administered Medications Medication Dose Route Frequency Provider Last Rate Last Admin ? apixaban (ELIQUIS) tablet 5 mg  5 mg Oral Q12H Sheikh, Chaires I, DO   5 mg at 05/24/20 0920 ? azithromycin (ZITHROMAX) tablet 250 mg  250 mg Oral Daily Yuridia Couts, Aneta Mins, MD   250 mg at 05/24/20 0920 ? budesonide (PULMICORT) nebulizer solution 0.5 mg  0.5 mg Nebulization BID Adrienne Mocha, MD   0.5 mg at 05/24/20 0848 ? hydroCHLOROthiazide (HYDRODIURIL) tablet 25 mg  25 mg Oral Daily Sheikh, Kateri Mc I, DO   25 mg at 05/24/20 0920 ? ipratropium-albuteroL (DUO-NEB) 0.5 mg-3 mg(2.5 mg base)/3 mL nebulizer solution 3 mL  3 mL Nebulization Q12H Pallett, Dola Argyle, MD   3 mL at 05/24/20 0848 ? methylPREDNISolone PF (Solu-MEDROL PF) injection 40 mg  40 mg Intravenous Q12H Pallett, Dola Argyle, MD   40 mg at 05/24/20 0920 ? pantoprazole (PROTONIX) EC tablet 40 mg  40 mg Oral Daily Pallett, Dola Argyle, MD   40 mg at 05/24/20 0920 ? pregabalin (LYRICA) capsule 50 mg  50 mg Oral BID Marguerita Merles I, DO   50 mg at 05/24/20 1096 ? sodium chloride 0.9 % flush 3 mL  3 mL IV Push Q8H Sheikh, Omair I, DO   3 mL at 05/24/20 0920  hydrALAZINE, HYDROmorphone, LORazepam, sodium chloride

## 2020-05-24 NOTE — Plan of Care
Plan of Care Overview/ Patient Status    Pt a+ox4, oob indpendent, VSS on 2L via NC, no complaints of weakness or SOB. Appetite and fluid intake adequate, no signs of acute distress noted. Skin intact. Piv patent. Call bell and belongings within reach and safety maintained.Problem: Adult Inpatient Plan of CareGoal: Plan of Care ReviewOutcome: Interventions implemented as appropriateGoal: Patient-Specific Goal (Individualized)Outcome: Interventions implemented as appropriateGoal: Absence of Hospital-Acquired Illness or InjuryOutcome: Interventions implemented as appropriateGoal: Optimal Comfort and WellbeingOutcome: Interventions implemented as appropriateGoal: Readiness for Transition of CareOutcome: Interventions implemented as appropriate Problem: Adjustment to Illness COPD (Chronic Obstructive Pulmonary Disease)Goal: Optimal Chronic Illness CopingOutcome: Interventions implemented as appropriate Problem: Functional Ability Impaired COPD (Chronic Obstructive Pulmonary Disease)Goal: Optimal Level of Functional IndependenceOutcome: Interventions implemented as appropriate Problem: Infection COPD (Chronic Obstructive Pulmonary Disease)Goal: Absence of Infection Signs and SymptomsOutcome: Interventions implemented as appropriate Problem: Oral Intake Inadequate COPD (Chronic Obstructive Pulmonary Disease)Goal: Improved Nutrition IntakeOutcome: Interventions implemented as appropriate Problem: Respiratory Compromise COPD (Chronic Obstructive Pulmonary Disease)Goal: Effective Oxygenation and VentilationOutcome: Interventions implemented as appropriate Problem: Gas Exchange ImpairedGoal: Optimal Gas ExchangeOutcome: Interventions implemented as appropriate Problem: Urinary RetentionGoal: Effective Urinary EliminationOutcome: Interventions implemented as appropriate Problem: Fall Injury RiskGoal: Absence of Fall and Fall-Related InjuryOutcome: Interventions implemented as appropriate

## 2020-05-24 NOTE — Progress Notes
[facility] Progress NoteAttending Provider: Youlanda Mighty, MD  Subjective                                                                              Subjective: Interim History: Low oxygen levels recorded overnight, 89%. Patient reports that his oxygen fell off and he was unaware. Today with more wheezing, but he feels better.  Review of Allergies/Meds/Hx: Review of Allergies/Meds/Hx:I have reviewed the patient's: current scheduled medications, current infusions, current prn medications, past medical history and past surgical history Objective Objective: Vitals:Last 24 hours: Temp:  [97.6 ?F (36.4 ?C)-97.8 ?F (36.6 ?C)] 97.6 ?F (36.4 ?C)Pulse:  [70-82] 70Resp:  [18-20] 20BP: (119-136)/(72-83) 120/72SpO2:  [89 %-94 %] 93 %I/O's:No intake or output data in the 24 hours ending 05/24/20 1153Procedures:None Physical Exam Well-nourished well-developed, sitting up in bed with nasal cannula oxygen in placeNeck supple, no JVD, no lymphadenopathyLungs with increased wheezing today in all fields.  Cardiovascular, regular rate, normal S1-S2, no murmurs rubs or gallops.Abdomen soft, nontender nondistendedExtremities warm well perfusedLabs:Last 24 hours: Recent Results (from the past 24 hour(s)) CBC without differential  Collection Time: 05/24/20  6:32 AM Result Value Ref Range  WBC 12.9 (H) 3.8 - 10.6 x1000/?L  RBC 5.0 4.6 - 6.1 M/?L  Hemoglobin 14.9 13.7 - 18.0 g/dL  Hematocrit 13.0 86.5 - 54.0 %  MCV 93.7 80.0 - 99.0 fL  MCHC 31.6 (L) 32.0 - 36.0 g/dL  RDW-CV 78.4 69.6 - 29.5 %  Platelets 247 140 - 446 x1000/?L  MPV 10.4 9.8 - 12.3 fL  ANC (Abs Neutrophil Count) 10.6 (H) 1.8 - 7.3 x 1000/?L  MCH 29.6 25.7 - 31.0 pg Comprehensive metabolic panel  Collection Time: 05/24/20  6:32 AM Result Value Ref Range  Sodium 141 136 - 145 mmol/L  Potassium 4.1 3.5 - 5.1 mmol/L  Chloride 107 95 - 115 mmol/L  CO2 23 21 - 32 mmol/L  Anion Gap 11 5 - 18  Glucose 122 (H) 70 - 100 mg/dL  BUN 25 8 - 25 mg/dL  Creatinine 2.84 1.32 - 1.30 mg/dL  Calcium 9.1 8.4 - 44.0 mg/dL  BUN/Creatinine Ratio 10.2 (H) 8.0 - 25.0  Total Protein 7.3 6.4 - 8.2 g/dL  Albumin 3.5 3.4 - 5.0 g/dL  Total Bilirubin 0.4 0.0 - 1.0 mg/dL  Alkaline Phosphatase 725 (H) 20 - 135 U/L  Alanine Aminotransferase (ALT) 76 12 - 78 U/L  Aspartate Aminotransferase (AST) 37 5 - 37 U/L  Globulin 3.8 g/dL  A/G Ratio 0.9   AST/ALT Ratio 0.5 See Comment  eGFR (Afr Amer) >60 >60 mL/min/1.35m2  eGFR (NON African-American) >60 >60 mL/min/1.21m2  Osmolality Calculation 287 275 - 295 mOsm/kg Diagnostics:05/21/20 Chest [place] reviewed: IMPRESSION:??1. No pulmonary embolism.?2. Partial resection of the right lower lobe with abnormal soft tissue about the right lower lobe bronchovascular bundle. Neoplasm not excluded.?3. Emphysema.?4. Small right pleural effusion.?5. 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  Unclear precipitant.  No e/o pneumonia, viral  PCR panel negative.  Today, indicates that he has had some swallowing difficulties as well with a sensation of pills/food getting stuck in his throat.  Recent EGD without overt pathology (GERD without esophagitis), per report available.  Now with progressive bronchospasm and hypoxemia overnight.   I have reviewed the patient's problem list and updated it as needed. Plan Plan: COPD exacerbation -Continue with IV solumedrol at BID dosing today-Continue with duonebs BID scheduled with albuterol PRN additional wheezing (minimize anticholinergics given hx of neurogenic bladder).  -Repeat CXR today-Repeat CRP and procalcitonin-Pulm consult appreciated-Wean O2 as tolerated-SLP/FEES in AM - patient had more difficulty with modified diet yesterday.  Resume regular pending SLP.  Elevated alk phos, chronic, mildly progressive-Await GGT -Abd u/s reviewed c/w fatty liver, no acute finding.  ?Chronic painContinue with Lyrica and Dilaudid?HypertensionContinue hydrochlorothiazide 25 mg's daily.  BP well controlled.  K wnl?Pulmonary embolismContinue with Eliquis 5 mg's b.i.d.?Code:  FullDiet:  Regular Electronically Signed:Cass Edinger Shanna Cisco, MD Beeper 38746/27/2021,11:57 AM

## 2020-05-25 ENCOUNTER — Inpatient Hospital Stay: Admit: 2020-05-25 | Payer: PRIVATE HEALTH INSURANCE | Attending: Diagnostic Radiology

## 2020-05-25 DIAGNOSIS — R0602 Shortness of breath: Secondary | ICD-10-CM

## 2020-05-25 MED ORDER — ENOXAPARIN 120 MG/0.8 ML SUBCUTANEOUS SYRINGE
1200.8 mg/0.8 mL | Freq: Two times a day (BID) | SUBCUTANEOUS | Status: DC
Start: 2020-05-25 — End: 2020-05-27
  Administered 2020-05-25 – 2020-05-27 (×4): 120 mg/0.8 mL via SUBCUTANEOUS

## 2020-05-25 NOTE — Plan of Care
Plan of Care Overview/ Patient Status    NUTRITION ASSESSMENT:69 yo male admitted with copd exacerbation. PMH: htn, copd, pe, dehydration, sepsis, sciatica, artery thrombosis, hld, neurogenic bladder, chronic pain, emphysema, urinary retention. Psx: thoracotomy, lung resection. Meds include: eliquis, zithromax, pulmicort, gerd, hctz, duo-neb, protonix, deltasone, lyrica. Labs 05/24/20: alk phos 245, glu 122. Last bm 05/21/20. Pt c/o difficulty swallowing sometimes, SLP evaluation ordered. Pt appears pale with o2 nasal cannula in place. t with dentures. Recommend change diet to cardiac, 3 gm sodium to better meet pts needs. Pt with BMI > 30, would benefit from long term weightt loss. Pt independent in ADL's.PES STATEMENT:Nutrition Diagnosis/Problem: Overweight/obesity Related To: excess energy itnake As Evidenced By: BMI > 30    RECOMMENDATIONS:1) cardiac, 3 gm sodium diet 2) SLP evaluation 3) aspiration precautions4) adjust diet per SLP recommendations5) long term weight loss6) current Hba1c level  7) bowel regimeINTERVENTIONS:1) change diet to above2) RD to remain availableMONITORING / EVALUATION:Skin integrity - Goal: intactWeight - Goal: stable during admissionPO intake - Goal: >/= 75% meals with good toleranceFollow up per protocol. [name], MS,RD NUTRITION (last 24 hours)  All Nutrition Adult - Sun May 24, 2020   Row Name [phone]   Current State  Specialty Diet/Nutrition Received  regular  Diet/Feeding Assistance  none  Diet/Feeding Tolerance  good   Risk Screen/Assessment  Ambulation  0 - independent  Transferring  0 - independent  Toileting  0 - independent  Bathing  0 - independent  Dressing  0 - independent  Eating  0 - independent  Row Name 2130   Risk Screen/Assessment  Ambulation  0 - independent  Transferring  0 - independent  Toileting  0 - independent  Bathing  0 - independent Dressing  0 - independent  Eating  0 - independent  All Nutrition Adult - Mon May 25, 2020   Row Name [phone]   Assessment  Timepoint  Assessment  Reason For Assessment  identified at risk by screening criteria  Identified At Risk by Screening Criteria  difficulty chewing/swallowing   Eating Environment  Location  Home  Row Name [phone]   Current State  Current Appetite  good  Specialty Diet/Nutrition Received  regular  Diet/Feeding Assistance  none  Diet/Feeding Tolerance  fair  Nutrition Interventions  diet adjusted;diet adjustment recommended  Nutrition Risk Screen  difficulty chewing/swallowing  Row Name [phone]   Estimated Energy Needs  Total Energy Estimated Needs  2208- 2300 kcal  Method for Estimating Needs  24-25 kcal/kg adj bw ( 92 kg)   Estimated Protein Needs  Total Protein Estimated Needs  92- 102 gm  Method for Estimating Needs  1- 1.1 gm/kg adj bw   Fluid Needs  Total Fluid Estimated Needs  2208- 2300 ml  Method for Estimating Needs  1 ml/kcal  Row Name [phone]   Adult Nutrition Diagnosis  Nutrition Diagnosis/Problem  Overweight/obesity  Related to  excess energy intake  As Evidenced by  BMI > 30

## 2020-05-25 NOTE — Progress Notes
[facility] Medicine Progress NoteAttending Provider: Youlanda Mighty, MD  Subjective                                                                              Subjective: Interim History: Improving overnight. Oxygen levels improved. Has been intermittently taking off his supplemental oxygen.  Cough diminishedAsking about discharge Still hypoxemic on RA, 89% at rest. Review of Allergies/Meds/Hx: Review of Allergies/Meds/Hx:I have reviewed the patient's: current scheduled medications, current infusions, current prn medications, past medical history and past surgical history Objective Objective: Vitals:Last 24 hours: Temp:  [97.5 ?F (36.4 ?C)-98.2 ?F (36.8 ?C)] 97.5 ?F (36.4 ?C)Pulse:  [76-92] 76Resp:  [20] 20BP: (131-134)/(79-84) 131/84SpO2:  [90 %-98 %] 96 %I/O's:Gross Totals (Last 24 hours) at 05/25/2020 1009Last data filed at 05/25/2020 0900Intake 300 ml Output -- Net 300 ml Procedures:None Physical Exam Well-nourished well-developed, sitting up in bed not wearing nasal cannulaNeck supple, no JVD, no lymphadenopathyLungs with improved air movement, no wheezingCardiovascular, regular rate, normal S1-S2, no murmurs rubs or gallops.Abdomen soft, nontender nondistendedExtremities warm well perfusedLabs:Last 24 hours: Recent Results (from the past 24 hour(s)) Lower respiratory culture, qualitative  Collection Time: 05/24/20  9:31 PM  Specimen: Sputum; Culture Result Value Ref Range  Lower Respiratory Culture No Growth to Date  Diagnostics:05/21/20 Chest  reviewed: ** ADDENDUM #1 **Comparison is made to prior CTA examinations dated 01/30/2020, 07/18/2019, 10/11/2018.?Postoperative changes of the right lower lobe are again identified. There is diminished contrast enhancement within the pulmonary artery to the right lower lobe which appears to have slightly propagated proximally since the prior exam (compare current study series 900 image 97 to the 01/30/2020 exam series 902 image 52). However, note that the 01/30/2020 finding appear improved from the 10/11/2018 exam.IMPRESSION:??1. No pulmonary embolism.?2. Partial resection of the right lower lobe with abnormal soft tissue about the right lower lobe bronchovascular bundle. Neoplasm not excluded.?3. Emphysema.?4. Small right pleural effusion.?5. 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  Unclear precipitant.  No e/o pneumonia, viral PCR panel negative.  Today, indicates that he has had some swallowing difficulties as well with a sensation of pills/food getting stuck in his throat.  Recent EGD without overt pathology (GERD without esophagitis), per report available.  Improving though still hypoxemic.  Reviewed with Dr. Billie Ruddy today, there has been propagation of his previously noted RLL PE.  Radiology report addended as above  I have reviewed the patient's problem list and updated it as needed. Plan Plan: COPD exacerbation Prior RLL PE with propogation, ? Significance to post operative territory-Transitioned to prednisone yesterday, ok-Continue duonebs BID and albuterol PRN-Pulm consult appreciated-Wean O2 as tolerated-SLP/FEES eval pending.  -Will discuss PE propagation with heme and thoracic in addition to Dr. Billie Ruddy.  Is this an apixaban failure?  Or expected post operative finding.  Elevated alk phos, chronic, mildly progressive-GGT is normal. -Needs bone evaluation in follow up-Abd u/s reviewed c/w fatty liver, no acute finding.  ?Chronic painContinue with Lyrica and Dilaudid?HypertensionContinue hydrochlorothiazide 25  mg's daily.  BP well controlled.  K wnl?Code:  FullDiet:  Regular Electronically Signed:Janine Reller Shanna Cisco, MD Beeper 38746/28/2021,11:57 AM

## 2020-05-25 NOTE — Plan of Care
Problem: Adult Inpatient Plan of CareGoal: Plan of Care ReviewOutcome: Interventions implemented as appropriateGoal: Patient-Specific Goal (Individualized)Outcome: Interventions implemented as appropriateGoal: Absence of Hospital-Acquired Illness or InjuryOutcome: Interventions implemented as appropriateGoal: Optimal Comfort and WellbeingOutcome: Interventions implemented as appropriateGoal: Readiness for Transition of CareOutcome: Interventions implemented as appropriate Problem: Adjustment to Illness COPD (Chronic Obstructive Pulmonary Disease)Goal: Optimal Chronic Illness CopingOutcome: Interventions implemented as appropriate Problem: Functional Ability Impaired COPD (Chronic Obstructive Pulmonary Disease)Goal: Optimal Level of Functional IndependenceOutcome: Interventions implemented as appropriate Problem: Infection COPD (Chronic Obstructive Pulmonary Disease)Goal: Absence of Infection Signs and SymptomsOutcome: Interventions implemented as appropriate Problem: Oral Intake Inadequate COPD (Chronic Obstructive Pulmonary Disease)Goal: Improved Nutrition IntakeOutcome: Interventions implemented as appropriate Problem: Respiratory Compromise COPD (Chronic Obstructive Pulmonary Disease)Goal: Effective Oxygenation and VentilationOutcome: Interventions implemented as appropriate Problem: Gas Exchange ImpairedGoal: Optimal Gas ExchangeOutcome: Interventions implemented as appropriate Problem: Urinary RetentionGoal: Effective Urinary EliminationOutcome: Interventions implemented as appropriate Problem: Fall Injury RiskGoal: Absence of Fall and Fall-Related InjuryOutcome: Interventions implemented as appropriate Problem: Skin Injury Risk IncreasedGoal: Skin Health and IntegrityOutcome: Interventions implemented as appropriatePlan of Care Overview/ Patient Status    0700-1900Patient A&Ox4. VSS, on 1L NC. Denies any SOB/DOE. Lung sounds diminished; infreq dry, congested cough. Cont of bowel; neurogenic bladder so pt self-catheterizes. OOB independently. Walked w/ pt in hallway w/ and w/o oxygen--on RA, O2 sat=89%; on 1L NC, O2 sat=94%. PRN Dilaudid given w/ good effect. Good appetite/fluid intake. No c/o pain. Safety maintained, hourly rounding, call bell in reach.

## 2020-05-25 NOTE — Plan of Care
[facility]		Spiritual Care NotePurpose: Referral Source: Chaplain Initiated Observation: People present/Information Obtained From: Patient Emotional Mood: Frustrated Quality of Relational Support: None or Very Little Types of Relational Support: Very Little   Subjective: Pt was awake and alert, sitting on his bed. He thanked me for stopping in again as I had seen him last week. He expressed frustration with the length of his hospitalization and was eager to be discharged. He also expressed general unwillingness to cooperate with advice given to him from the care team, specifically with continuing on oxygen. I attempted to offer a non-anxious presence and non-judgmental listening. Pt expressed gratitude for the visit.Spiritual Assessment:Referral Source: Chaplain Initiated Information Obtained From: Patient Mood: FrustratedRelational SupportTypes of Relational Support: Very Ecologist Support: None or Very Little Religious Affiliation: Lutheran   Helpful Religious Practices: Scripture Reading    Issues RaisedRaised by Patient: Length of Illness, Eager to Return Home   Spiritual Interventions:Spiritual Intervention Index**Date of Spiritual Visit: 06/28/21Visit Type: Follow Up VisitLanguage or special accommodation rendered?: NoHow Well Do You Speak English?: very wellIntervention Type: Spiritual VisitResponding Chaplain: InternReligious Needs: BibleSpiritual/Religious Support Provided: Companionship, Introduction to Chaplaincy ServicesOutcome:     OUTCOMES: Progressed Spiritually: Established chaplain relationship, Progressed toward trust OUTCOMES: Progressed Emotionally or Physically: Increased satisfaction     Plan: Follow up as needed. I indicated that if I am able, and if he is still here, I will do my best to stop in and see him tomorrow.    	Signed: Estelle Grumbles, Chaplain Intern6/28/20212:13 PMPlan of Care Overview/ Patient Status

## 2020-05-25 NOTE — Plan of Care
Adult Speech and Language PathologyFEES Evaluation6/28/2021Patient Name:  Henry Brigham HelgesonMR#:  [id number] Date of Birth:  1952-08-29Therapist:  Eliot Ford, SLP SLP IP Adult General Information - 05/25/20 1154    General Information  Pertinent History of Current Problem  69 year old male admitted with copd exacerbation. PMH: htn, copd, pe, dehydration, sepsis, sciatica, artery thrombosis, hld, neurogenic bladder, chronic pain, emphysema, urinary retention, tobacco use,  ETOH consumption. Psx: thoracotomy, lung resection. Referred to SLP for FEES given c/o dysphagia to solids and pills. Subjectively, patient describes pills and steak to feel as if they catch in throat.  Symptoms occur intermittently, never with liquids. Patient states onset of symptoms approximately 10 years and around the time of prior spinal fusion. Recent EGD demonstrating reflux, no esophagitis (report unavailable).         SLP Adult FEES - 05/25/20 1411    Fiberoptic Endoscopic Evaluation of Swallowing  Type of Study  Initial FEES   Reason for Study  concern for aspiration risk;other (add comments)  dysphagia to solids, h/o reflux and COPD  Patients Current Diet  Regular consistency diet;Thin liquids   FEES was performed  At bedside   Scope was inserted in the nares  Right   Pooled secretions noted  In pyriform sinuses  mild secretions-frothy  True Vocal Cords  Mobile bilaterally   Other Observations  Secretions pyriforms bilaterally; interarytenoid bar; supraglottic and post-cricoid edema   Thin Liquid  yes        Delivery  Straw        Volume  small single sip;continuous        Oral Phase  Adequate;Spillage over tongue base        Pharyngeal Phase  Other        Esophogeal Phase  Inadequate  Hold-up at level of UES       Laryngeal Penetration  Inconsistent;During swallow  with serial swallows       Aspiration  None        Other  Penetration-Aspiration Scale Score 5/8: Material enters the airway, contacts vocal folds and not ejected from the airway. This is observed during serial swallows. Controlled Swallow, improving timing and effeciency of laryngeal vestibule closure   Puree  yes        Delivery  Spoon        Volume  full teaspoon        Oral Phase  Adequate        Pharyngeal Phase  Adequate        Esophogeal Phase  Inadequate  Scant retroflow from below UES       Laryngeal Penetration  None        Aspiration  None   Regular Solid  yes        Delivery  Self fed        Volume  2 bites of bread        Oral Phase  Adequate        Pharyngeal Phase  Adequate        Esophogeal Phase  Adequate        Laryngeal Penetration  None        Aspiration  None   Successful Compensatory Strategies  Small sips and bites  Controlled single sips of liquids, small sip, hold in mouth, prepare breathing, swallow  Diagnosis  Patient seen for initial FEES examination given c/o dysphagia to pills and solids, symptoms are intermittent and longstanding, onset reportedly followed spinal fusion surgery. No prior  FEES/MBS. Also, aspiration risk identified given respiratory illness, h/o COPD. GI history includes reflux.Verbal consent obtained from patient for FEES. Left nares very sensitive to scope presence. Scope advanced via right floor of nose easily. Laryngeal assessment: Small amount of secretions in pyriforms. There is supraglottic and post cricoid edema as well as interarytenoid bar.FEES demonstrating mild pharyngo-esophageal dysphagia. Timing and efficiency of laryngeal vestibule closure (LVC) was reduced. This mistiming of LVC is judged to be c/w underlying respiratory illness/COPD. Laryngeal penetration is visualized during serial swallows, without tracheal aspiration (PAS scale score 5/8). No spontaneous 'throat clear' or cough in response to material dipping to level of vocal cords, suggestive of reduced laryngeal sensitivity. There was hold up at level of UES suspicious for cricopharyngeal dysfunction. MBS may be considered to further evaluate swallow function, allowing for visualization of CP 'bar'. Consider outpatient referral to Laryngologist, Dr Benay Spice, for additional work up/management (reflux, possible CPD).  Updated Dr Christell Faith time of FEES completion. SLP will follow along, MBS tomorrow if medical team would like to pursue prior to d/c.   Diagnosis secondary to:  Mistimed airway protection;Decreased UES opening  Hold up at level of UES suspicious for cricopharyngeal dysfunction versus reflux.  Diet Recommendations  Regular solids;Thin liquids   Recommendations  Liquid from cup only;Small sips / bites  Controlled single sips (small sip into mouth, hold, prepare your breathing, swallow). Avoid serial swallows to reduce risk for respiratory/swallow discordance.  Aspiration Precautions  yes  Frequent mouth care; Reflux Management; Swal Precautions as above  Discontinue PO if patient experiences:  Increased temperature;Increased coughing;Increased fatigue             SLP IP Adult Inpatient Recommendations - 05/25/20 1426    SLP Recommendations for Inpatient Admission  Swallow Recommendations 1) Continue Regular solids and Thin liquids 2) Aspiration Precautions: frequent mouth care, single controlled sips, avoid serial swallows (risk for respiratory/swallow discordance) 3) Consider Modified Barium Swallow Study to further assess for CPD 4) Outpatient referral to Laryngologist, Dr Almon Hercules    Plan of Care Overview/ Patient Status    Henry Pellegrini. Durenda Age MS, CCC-SLP

## 2020-05-25 NOTE — Progress Notes
Pulmonary Follow-upFeels well.  Asking about home.Some concern about his swallow and a FEES is planned.His oxygen saturation on room air at rest is 88 to 89%.I have re-reviewed his chest imaging.Current Facility-Administered Medications: ?  apixaban (ELIQUIS) tablet 5 mg, 5 mg, Oral, Q12H, Sheikh, Omair I, DO, 5 mg at 05/25/20 1610?  [COMPLETED] azithromycin (ZITHROMAX) tablet 500 mg, 500 mg, Oral, Daily, 500 mg at 05/23/20 1236 **FOLLOWED BY** azithromycin (ZITHROMAX) tablet 250 mg, 250 mg, Oral, Daily, Mazo, Aneta Mins, MD, 250 mg at 05/25/20 9604?  budesonide (PULMICORT) nebulizer solution 0.5 mg, 0.5 mg, Nebulization, BID, Adrienne Mocha, MD, 0.5 mg at 05/25/20 5409?  hydrALAZINE (APRESOLINE) injection/vial 5 mg, 5 mg, IV Push, Q4H PRN, Marguerita Merles I, DO?  hydroCHLOROthiazide (HYDRODIURIL) tablet 25 mg, 25 mg, Oral, Daily, Sheikh, Lamar I, DO, 25 mg at 05/25/20 8119?  HYDROmorphone (DILAUDID) tablet 4 mg, 4 mg, Oral, Q6H PRN, Sheikh, Omair I, DO, 4 mg at 05/25/20 0750?  ipratropium-albuteroL (DUO-NEB) 0.5 mg-3 mg(2.5 mg base)/3 mL nebulizer solution 3 mL, 3 mL, Nebulization, Q12H, Pallett, Dola Argyle, MD, 3 mL at 05/25/20 0754?  LORazepam (ATIVAN) tablet 1 mg, 1 mg, Oral, Q6H PRN, Sheikh, Omair I, DO?  pantoprazole (PROTONIX) EC tablet 40 mg, 40 mg, Oral, Daily, Pallett, Dola Argyle, MD, 40 mg at 05/25/20 1478?  predniSONE (DELTASONE) tablet 40 mg, 40 mg, Oral, Daily, Mazo, Aneta Mins, MD, 40 mg at 05/25/20 2956?  pregabalin (LYRICA) capsule 50 mg, 50 mg, Oral, BID, Sheikh, Omair I, DO, 50 mg at 05/25/20 2130?  sodium chloride 0.9 % flush 3 mL, 3 mL, IV Push, Q8H, Sheikh, Omair I, DO, 3 mL at 05/24/20 2130?  sodium chloride 0.9 % flush 3 mL, 3 mL, IV Push, PRN for [facility], Aldie, Omair I, DO, 3 mL at 05/22/20 0848BP 131/84  - Pulse 76  - Temp 97.5 ?F (36.4 ?C) (Oral)  - Resp 20  - Ht 6' (1.829 m)  - Wt 111.2 kg  - SpO2 94%  - BMI 33.25 kg/m? Alert and fully engaged.No prolongation of the expiratory phase.No wheezes, rales or rhonchiHeart is regularExtremities are without clubbing cyanosis or edema.Recent Labs   06/26/210709 06/27/210632 NA 139 141 K 3.9 4.1 CL 106 107 CO2 23 23 BUN 24 25 CREATININE 0.87 0.91 GLU 135* 122* Recent Labs   06/26/210709 06/27/210632 WBC 13.4* 12.9* HGB 14.6 14.9 PLT 285 247  Results for orders placed or performed during the hospital encounter of 05/21/20 CTA Chest (PE) w IV Contrast  Narrative  ** ADDENDUM #1 **Comparison is made to prior CTA examinations dated 01/30/2020, 07/18/2019, 10/11/2018.Postoperative changes of the right lower lobe are again identified. There is diminished contrast enhancement within the pulmonary artery to the right lower lobe which appears to have slightly propagated proximally since the prior exam (compare current study series 900 image 97 to the 01/30/2020 exam series 902 image 52). However, note that the 01/30/2020 finding appear improved from the 10/11/2018 exam.Discussed with Dr. Billie Ruddy, 05/25/2020 11:05 AM.Reported and Signed by:  Antonieta Iba, MD** ORIGINAL REPORT **Clinical information: Male, 69 years old, PE suspected, low/intermediate prob, positive D-dimerSOB.Technique: CTA CHEST (PE) W IV CONTRAST.70 cc of Omni 350 was injected intravenously.Radiation dose reduction was achieved using automated exposure control and/or patient sized based adjustment of MA and/or kV and/or the use of iterative reconstruction.Comparison: 01/30/2020.Findings: The heart is normal in size without pericardial effusion. There is no pulmonary embolism. There is no mediastinal adenopathy. The patient is status post partial resection of the right lower  lobe. There is abnormal soft tissue about the bronchovascular bundle to the right lower lobe (series 7 image 78) and the possibility of neoplasm is not excluded and follow-up is recommended. There is emphysema. There is no consolidation. There is a small right-sided pleural effusion. Images of the upper abdomen demonstrate fatty infiltration of the liver. The bones are intact.  Impression  1. No pulmonary embolism.2. Partial resection of the right lower lobe with abnormal soft tissue about the right lower lobe bronchovascular bundle. Neoplasm not excluded.3. Emphysema.4. Small right pleural effusion.5. 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 Right lower lobe Pulmonary artery thrombosisTechnique: After the intravenous administration of 70 mL of Omnipaque 350 a postcontrast [place] of the chest was performed. Individual dose optimization technique was used for the performance of this procedure, and one or more of the following dose reduction techniques was used: Automated exposure control and/or adjustment of the MA and/or kV according to patient size and/or the use of iterative reconstruction technique.Comparison: November 14, 2019Findings:    Chest:    The prior right lower lobe segmental embolus is not seen though study is not optimized for evaluation of pulmonary emboli. There is no central embolus. Centrilobular emphysema with upper lobe predominance is unchanged. There are expected postsurgical changes at the right base. There is some stable right hilar nodal prominence which is subcentimeter in short axis, series 7 image 60. Attention on follow-up is recommended. There are no suspicious pulmonary nodules.  The central airways are patent. No mediastinal adenopathy. Heart is normal in size. No axillary adenopathy.Limited evaluation of the upper abdomen is unremarkable. There are no suspicious osseous lesions.  Impression  Impression: 1.  Expected postsurgical change at the right base without evidence of local recurrence. Some right hilar nodal prominence is stable, possibly reactive, attention on follow-up recommended.2.  Prior right lower lobe segmental embolus is not seen. No central pulmonary embolus.3.  Extensive upper lobe predominant emphysemaReported and Signed by:  Lonna Cobb, MD Results for orders placed or performed during the hospital encounter of 09/17/18 [place] Chest wo IV Contrast  Narrative   chest noncontrastCLINICAL INFORMATION: Follow-up nodule, is it vascular?, Super DCOMPARISON: 08/23/2018 and 07/05/2017.TECHNIQUE: Multislice spiral acquisition was performed using the Super D protocol and multiplanar reformatting. Individual dose optimization technique was used for the performance of this procedure, and one or more of the following dose reduction techniques was used: Automated exposure control and/or adjustment of the MA and/or kV according to patient size and/or the use of iterative reconstruction technique.FINDINGS: An oval nodule is seen at a bifurcation of a peripheral vessel in the superior segment of the right lower lobe, measuring currently about 8 mm in maximum AP diameter, unchanged from most recent prior study,, measured at about 7 mm 07/05/2017. The nodule is smooth, noncalcified, without irregular or spiculated margins. No other pulmonary nodule, mass or infiltrate is seen. Moderate emphysema is most pronounced in the upper lobes. No primary interstitial lung disease is seen. There is no pleural fluid or pneumothorax. The trachea and central airways are unremarkable.A few small lymph nodes in the middle mediastinum are unchanged. No mediastinal mass is identified. Scattered atherosclerotic calcification in the wall of the aorta is noted and a few scattered coronary arterial calcifications are noted. There is no  pericardial fluid. No esophageal dilation or wall thickening is seen.Scans continued into the upper abdomen reveal no adrenal enlargement. The visualized liver and the spleen reveal no specific abnormalities. There are no other specific abnormalities or changes from previous examination identified.  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  That thrombosis has propagated proximally despite Eliquis.I am not certain how to think about this.  Is this Eliquis failure?  Is this the cause of his COPD exacerbation?Is there more soft tissue down there?Hypoxia that will make discharge difficult without oxygenPSupplemental oxygen as needed.Agree with FEESAppreciate Radiology re reviewI favor heparin or Lovenox for now and would be interested in Hematology opinion.Perhaps thoracic surgery has more experience with this sort of situation. Prednisone and DuoNebs and budesonide nebs.  He may need more aggressive inhaled regimen for discharge than the p.r.n. albuterol he was receiving before.Tyrae Alcoser Leibert6/28/2021 12:07 PMBeeper [id number], Extension 3172Cell [phone] for any questions

## 2020-05-25 NOTE — Plan of Care
Plan of Care Overview/ Patient Status    Received pt @ 1900 in stable condition. A&Ox4. VSS on 2L NC. C/o back pain, prn dilaudid administered. Meds administered per MAR. OOB independently. Pt self-catheterizes. Safety precautions maintained. Call bell w/in reach.Problem: Adult Inpatient Plan of CareGoal: Plan of Care ReviewOutcome: Interventions implemented as appropriateGoal: Patient-Specific Goal (Individualized)Outcome: Interventions implemented as appropriateGoal: Absence of Hospital-Acquired Illness or InjuryOutcome: Interventions implemented as appropriateGoal: Optimal Comfort and WellbeingOutcome: Interventions implemented as appropriateGoal: Readiness for Transition of CareOutcome: Interventions implemented as appropriate Problem: Adjustment to Illness COPD (Chronic Obstructive Pulmonary Disease)Goal: Optimal Chronic Illness CopingOutcome: Interventions implemented as appropriate Problem: Functional Ability Impaired COPD (Chronic Obstructive Pulmonary Disease)Goal: Optimal Level of Functional IndependenceOutcome: Interventions implemented as appropriate Problem: Infection COPD (Chronic Obstructive Pulmonary Disease)Goal: Absence of Infection Signs and SymptomsOutcome: Interventions implemented as appropriate Problem: Oral Intake Inadequate COPD (Chronic Obstructive Pulmonary Disease)Goal: Improved Nutrition IntakeOutcome: Interventions implemented as appropriate Problem: Respiratory Compromise COPD (Chronic Obstructive Pulmonary Disease)Goal: Effective Oxygenation and VentilationOutcome: Interventions implemented as appropriate Problem: Gas Exchange ImpairedGoal: Optimal Gas ExchangeOutcome: Interventions implemented as appropriate Problem: Urinary RetentionGoal: Effective Urinary EliminationOutcome: Interventions implemented as appropriate Problem: Fall Injury RiskGoal: Absence of Fall and Fall-Related InjuryOutcome: Interventions implemented as appropriate

## 2020-05-25 NOTE — Plan of Care
Plan of Care Overview/ Patient Status    Received pt @ 1900 in stable condition. A&Ox4. VSS on 2L NC. C/o back pain, prn dilaudid administered with good effect. Meds administered per MAR. Pt self-catheterizes. Sputum sample collected and sent to lab. OOB independently. Safety precautions maintained. Call bell w/in reach.Problem: Adult Inpatient Plan of CareGoal: Plan of Care ReviewOutcome: Interventions implemented as appropriateGoal: Patient-Specific Goal (Individualized)Outcome: Interventions implemented as appropriateGoal: Absence of Hospital-Acquired Illness or InjuryOutcome: Interventions implemented as appropriateGoal: Optimal Comfort and WellbeingOutcome: Interventions implemented as appropriateGoal: Readiness for Transition of CareOutcome: Interventions implemented as appropriate Problem: Adjustment to Illness COPD (Chronic Obstructive Pulmonary Disease)Goal: Optimal Chronic Illness CopingOutcome: Interventions implemented as appropriate Problem: Functional Ability Impaired COPD (Chronic Obstructive Pulmonary Disease)Goal: Optimal Level of Functional IndependenceOutcome: Interventions implemented as appropriate Problem: Infection COPD (Chronic Obstructive Pulmonary Disease)Goal: Absence of Infection Signs and SymptomsOutcome: Interventions implemented as appropriate Problem: Oral Intake Inadequate COPD (Chronic Obstructive Pulmonary Disease)Goal: Improved Nutrition IntakeOutcome: Interventions implemented as appropriate Problem: Respiratory Compromise COPD (Chronic Obstructive Pulmonary Disease)Goal: Effective Oxygenation and VentilationOutcome: Interventions implemented as appropriate Problem: Gas Exchange ImpairedGoal: Optimal Gas ExchangeOutcome: Interventions implemented as appropriate Problem: Urinary RetentionGoal: Effective Urinary EliminationOutcome: Interventions implemented as appropriate Problem: Fall Injury RiskGoal: Absence of Fall and Fall-Related InjuryOutcome: Interventions implemented as appropriate

## 2020-05-25 NOTE — Progress Notes
Addendum: Reviewed with [name], thoracic surgery.  PE not expected to remain following wedge resection.  He would consider this a failure of apixaban and recommends transitioning to an alternative anticoagulant.  Will check with hematology, but will use lovenox for now.  Would rivaroxaban provide any advantage? Also reviewed FEES results. FEES demonstrating mild pharyngo-esophageal dysphagia. Timing and efficiency of laryngeal vestibule closure (LVC) was reduced. This mistiming of LVC is judged to be c/w underlying respiratory illness/COPD. Laryngeal penetration is visualized during serial swallows, without tracheal aspiration (PAS scale score 5/8). No spontaneous 'throat clear' or cough in response to material dipping to level of vocal cords, suggestive of reduced laryngeal sensitivity. ?There was hold up at level of UES suspicious for cricopharyngeal dysfunction. MBS may be considered to further evaluate swallow function, allowing for visualization of CP 'bar'. Consider outpatient referral to Laryngologist, Dr Benay Spice, for additional work up/management (reflux, possible CPD).  Updated Dr Christell Faith time of FEES completion. SLP will follow along, MBS tomorrow if medical team would like to pursue prior to d/c. Will order MBS  Patient referral to ENT, Dr. Almon Hercules upon discharge

## 2020-05-26 ENCOUNTER — Inpatient Hospital Stay: Admit: 2020-05-26 | Payer: PRIVATE HEALTH INSURANCE

## 2020-05-26 DIAGNOSIS — R0602 Shortness of breath: Secondary | ICD-10-CM

## 2020-05-26 LAB — COMPREHENSIVE METABOLIC PANEL
BKR A/G RATIO: 0.9
BKR ALANINE AMINOTRANSFERASE (ALT): 173 U/L — ABNORMAL HIGH (ref 12–78)
BKR ALBUMIN: 3.5 g/dL (ref 3.4–5.0)
BKR ALKALINE PHOSPHATASE: 242 U/L — ABNORMAL HIGH (ref 20–135)
BKR ANION GAP: 9 (ref 5–18)
BKR ASPARTATE AMINOTRANSFERASE (AST): 78 U/L — ABNORMAL HIGH (ref 5–37)
BKR AST/ALT RATIO: 0.5
BKR BILIRUBIN TOTAL: 0.4 mg/dL (ref 0.0–1.0)
BKR BLOOD UREA NITROGEN: 26 mg/dL — ABNORMAL HIGH (ref 8–25)
BKR BUN / CREAT RATIO: 28 — ABNORMAL HIGH (ref 8.0–25.0)
BKR CALCIUM: 9.1 mg/dL (ref 8.4–10.3)
BKR CHLORIDE: 104 mmol/L (ref 95–115)
BKR CO2: 27 mmol/L (ref 21–32)
BKR CREATININE: 0.93 mg/dL (ref 0.50–1.30)
BKR EGFR (AFR AMER): >60 mL/min/{1.73_m2} (ref >60)
BKR EGFR (NON AFRICAN AMERICAN): >60 mL/min/{1.73_m2} (ref >60)
BKR GLOBULIN: 3.8 g/dL
BKR GLUCOSE: 99 mg/dL (ref 70–100)
BKR OSMOLALITY CALCULATION: 284 mosm/kg (ref 275–295)
BKR POTASSIUM: 3.5 mmol/L (ref 3.5–5.1)
BKR PROTEIN TOTAL: 7.3 g/dL (ref 6.4–8.2)
BKR SODIUM: 140 mmol/L (ref 136–145)

## 2020-05-26 LAB — CBC WITHOUT DIFFERENTIAL
BKR WAM ANALYZER ANC: 5.8 x 1000/ÂµL (ref 1.8–7.3)
BKR WAM HEMATOCRIT: 47.3 % (ref 41.0–54.0)
BKR WAM HEMOGLOBIN: 15.4 g/dL (ref 13.7–18.0)
BKR WAM MCH (PG): 30.3 pg (ref 25.7–31.0)
BKR WAM MCHC: 32.6 g/dL (ref 32.0–36.0)
BKR WAM MCV: 92.9 fL (ref 80.0–99.0)
BKR WAM MPV: 10.5 fL (ref 9.8–12.3)
BKR WAM PLATELETS: 267 x1000/ÂµL (ref 140–446)
BKR WAM RDW-CV: 13.6 % (ref 11.5–14.5)
BKR WAM RED BLOOD CELL COUNT: 5.1 M/ÂµL (ref 4.6–6.1)
BKR WAM WHITE BLOOD CELL COUNT: 9.9 x1000/ÂµL (ref 3.8–10.6)
CARDIOLIPIN IGM: 13.6 MPL-U/mL (ref <20.0)

## 2020-05-26 LAB — BLOOD CULTURE   (BH GH L LMW YH)
BKR BLOOD CULTURE: NO GROWTH
BKR BLOOD CULTURE: NO GROWTH

## 2020-05-26 LAB — BETA-2 MICROGLOBULIN, SERUM: BKR BETA-2 MICROGLOBULIN: 1.66 mg/L (ref 0.80–2.20)

## 2020-05-26 MED ORDER — WARFARIN THERAPY PLACEHOLDER
ORAL | Status: DC
Start: 2020-05-26 — End: 2020-05-27

## 2020-05-26 MED ORDER — WARFARIN 10 MG TABLET
10 mg | Freq: Once | ORAL | Status: CP
Start: 2020-05-26 — End: ?
  Administered 2020-05-26: 22:00:00 10 mg via ORAL

## 2020-05-26 MED ORDER — CEFUROXIME AXETIL 250 MG TABLET
250 mg | Freq: Two times a day (BID) | ORAL | Status: DC
Start: 2020-05-26 — End: 2020-05-27
  Administered 2020-05-27 (×2): 250 mg via ORAL

## 2020-05-26 NOTE — Progress Notes
Pulmonary Follow-upFeels well.  Walking around the hall without oxygen.Speech and swallow evaluation noted.[name]' input noted.  Dr. Marigene Ehlers input noted.Today he tells me that the p.r.n. albuterol that I gave him when I saw him in the office on 06/22 caused unacceptable jitters.BP 114/79  - Pulse (!) 98  - Temp 98.7 ?F (37.1 ?C) (Oral)  - Resp 18  - Ht 6' (1.829 m)  - Wt 111.2 kg  - SpO2 (!) 93%  - BMI 33.25 kg/m? 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  I do not believe that this is the cause of his admission or the cause of his COPD exacerbation.  I am not certain that the propagation is clinically significant since it has not crossed a branch point into a larger artery.PSupplemental oxygen as needed.Perhaps thoracic surgery has more experience with this sort of situation.Prednisone and taper. DuoNebs and budesonide nebs.  Henry Plain Leibert6/29/2021 5:58 PMBeeper 3172, Extension 3172Cell [phone] for any questions

## 2020-05-26 NOTE — Plan of Care
Adult Speech and Language PathologyFEES Evaluation6/29/2021Patient Name:  Henry Romanello HelgesonMR#:  [id number] Date of Birth:  [birth year]Therapist:  Eliot Ford, SLP SLP IP Adult General Information - 05/26/20 1455    General Information  Pertinent History of Current Problem  69 year old male admitted with copd exacerbation. PMH: htn, copd, pe, dehydration, sepsis, sciatica, artery thrombosis, hld, neurogenic bladder, chronic pain, emphysema, urinary retention, tobacco use,  ETOH consumption. Psx: thoracotomy, lung resection. Referred to SLP given c/o dysphagia to solids and pills. Subjectively, patient describes pills and steak to feel as if they catch in throat.  Symptoms occur intermittently, never with liquids. Patient states onset of symptoms approximately 10 years and around the time of prior spinal fusion. Recent EGD demonstrating reflux, no esophagitis (report unavailable). See below for FEES findings.   Prior Level of Functioning  Initial FEES completed 05/25/20: Laryngeal assessment: Small amount of secretions in pyriforms. There is supraglottic and post cricoid edema as well as interarytenoid bar. FEES demonstrating mild pharyngo-esophageal dysphagia. Timing and efficiency of laryngeal vestibule closure (LVC) was reduced. This mistiming of LVC is judged to be c/w underlying respiratory illness/COPD. Laryngeal penetration is visualized during serial swallows, without tracheal aspiration (PAS scale score 5/8). No spontaneous 'throat clear' or cough in response to material dipping to level of vocal cords, suggestive of reduced laryngeal sensitivity.  There was hold up at level of UES suspicious for cricopharyngeal dysfunction. MBS may be considered to further evaluate swallow function, allowing for visualization of CP 'bar'.        SLP Modified Barium Swallow - 05/26/20 1449    Modified Barium Swallow  Type of Study  Initial Modified Barium Swallow Reason for Study  other (add comments)  FEES demonstrating residual at level of UES, Concern for Cricopharyngeal Dysfunction in patient with h/o reflux  Patients Current Diet  Regular consistency diet;Thin liquids   Patient Position  Standing;Seated;Lateral;AP   Thin Barium  yes        Delivery  Straw        Volume  small single sip;large single sip        Oral Phase  Spillage over tongue base       Laryngeal Penetration  Mild  Pen-Asp Scale Score: Thin liquid material enters laryngeal airway, remains above vocal folds, and is ejected from the airway 2/8)Image showing laryngeal penetration (undercoating of epiglottis) before onset of swallow. Tracheal airway is clear.        Aspiration  None   Puree with Barium  yes        Delivery  Spoon        Volume  full teaspoon        Oral Phase  Adequate        Pharyngeal Phase  Adequate        Esophogeal Phase  Adequate        Laryngeal Penetration  None        Aspiration  None   Regular Solid with Barium  yes        Delivery  By hand;Self fed        Volume  2 bites of cracker with barium icing        Oral Phase  Adequate        Pharyngeal Phase  Adequate        Esophogeal Phase  Adequate        Laryngeal Penetration  None        Aspiration  None   Barium Pill  yes        Delivery  With thin liquid        Oral Phase  Adequate        Pharyngeal Phase  Adequate        Esophogeal Phase  Adequate        Laryngeal Penetration  None        Aspiration  None   Diagnosis  Mild pharyngeal dysphagia   Diagnosis secondary to:  Mistimed airway protection   Diet Recommendations  Regular solids;Thin liquids   Medication Administration  with liquid;whole   Recommendations  Sit upright with eating and drinking;Small sips / bites   Patients feeding ability  Patient is an independent feeder   Impressions/Recommendations MBS completed and demonstrating mild pharyngeal phase dysphagia. Pharyngeal wall appearing thickened. There is bony prominence of cervical vertebrae (C6-C7) with spinal hardware visible just below. Oral phase: Grossly functional across all barium preparations. Pharyngeal Phase: Mild pharyngeal dysphagia, characerized by mistiming of laryngeal vestibule closure. Epiglottic undercoating is observed with thin liquids, prior to onset of swallow/'hyoid burst'. Penetration was minimal, material remained well above level of vocal cords and not visible within laryngeal vestibule upon completion of the swallow. There was no tracheal aspiration during exam. There was scant coating of barium at UES after the swallow. Esophageal Phase: No cricopharyngeal bar or indentation of barium column. Esophageal sweep: barium transit timely, barium pill cleared, no retroflow (see image series 9, 10). Findings are not suggestive of cricopharyngeal hypertrophy. Based on today's MBS, FEES (6/28) and known history, SLP recommending continuation of regular diet and thin liquids. Aspiration precautions: upright, small controlled sips, monitor overall WOB during mealtimes, energy conservation strategies. Compliance with plan to optimize reflux management. Follow up with medical team for next steps, would maintain plain for outpatient follow up with Dr Benay Spice.              SLP IP Adult Inpatient Recommendations - 05/26/20 1519    SLP Recommendations for Inpatient Admission  Swallow Recommendations 1) Continue Regular Solids and Thin liquids 2) Aspiration Precautions: Frequent mouth care, single controlled sips, avoid serial swallows (risk for respiratory/swallow discordance) 3) Follow MD instructions for reflux management 4) Consider outpatient referral to Laryngologist, Dr Benay Spice 5) SLP will follow along    Plan of Care Overview/ Patient Status    Princella Pellegrini. Dakin Madani MS, CCC-SLP MHB

## 2020-05-26 NOTE — Plan of Care
Plan of Care Overview/ Patient Status    Received pt @ 1900 in stable condition. A&Ox4. VSS on room air. C/o back pain, prn dilaudid administered 2x. Meds administered per MAR. Pt self-catheterrizes.OOB independently. Safety precautions maintained. Call bell w/in reach.Problem: Adult Inpatient Plan of CareGoal: Plan of Care ReviewOutcome: Interventions implemented as appropriateGoal: Patient-Specific Goal (Individualized)Outcome: Interventions implemented as appropriateGoal: Absence of Hospital-Acquired Illness or InjuryOutcome: Interventions implemented as appropriateGoal: Optimal Comfort and WellbeingOutcome: Interventions implemented as appropriateGoal: Readiness for Transition of CareOutcome: Interventions implemented as appropriate Problem: Adjustment to Illness COPD (Chronic Obstructive Pulmonary Disease)Goal: Optimal Chronic Illness CopingOutcome: Interventions implemented as appropriate Problem: Functional Ability Impaired COPD (Chronic Obstructive Pulmonary Disease)Goal: Optimal Level of Functional IndependenceOutcome: Interventions implemented as appropriate Problem: Infection COPD (Chronic Obstructive Pulmonary Disease)Goal: Absence of Infection Signs and SymptomsOutcome: Interventions implemented as appropriate Problem: Oral Intake Inadequate COPD (Chronic Obstructive Pulmonary Disease)Goal: Improved Nutrition IntakeOutcome: Interventions implemented as appropriate Problem: Respiratory Compromise COPD (Chronic Obstructive Pulmonary Disease)Goal: Effective Oxygenation and VentilationOutcome: Interventions implemented as appropriate Problem: Gas Exchange ImpairedGoal: Optimal Gas ExchangeOutcome: Interventions implemented as appropriate Problem: Urinary RetentionGoal: Effective Urinary EliminationOutcome: Interventions implemented as appropriate Problem: Fall Injury RiskGoal: Absence of Fall and Fall-Related InjuryOutcome: Interventions implemented as appropriate Problem: Skin Injury Risk IncreasedGoal: Skin Health and IntegrityOutcome: Interventions implemented as appropriate

## 2020-05-26 NOTE — Plan of Care
Plan of Care Overview/ Patient Status    Patient AAOx4, on room air, VSS. Weaned off o2 this AM, spO2 91-95% on room air. Denies SOB or DOE. Independent in room. Self catheterizes. Meds administered per MAR. Verbalizes relief of back pain with scheduled lyrica. Appetite good. Plan for barium swallow at 1 PM. Ambulated in hall today. All needs anticipated. CB in reach, safety maintained.

## 2020-05-26 NOTE — Plan of Care
[facility]		Spiritual Care NotePurpose: Referral Source: Chaplain Initiated Observation: People present/Information Obtained From: Patient Emotional Mood: Disappointed, Press photographer of Relational Support: None or Very Little Types of Relational Support: Very Little   Subjective: Pt was awake and alert. He informed me of his new diagnosis, and stated he may be here for a longer period in the hospital while a treatment option is sought. Pt expressed resistance to taking oxygen home with him. He is restless and is eager to get home. Pt spoke about his living situation and seems to be very isolated even from those living in his building. He expressed a fear of dying and stated that he is not ready to die yet. He stated that he won't accept death, although he realizes it is a reality. I gave him some advice for reading the Bible I provided for him last week, for which he was grateful. He expressed gratitude for the visit and a desire for me to return later if he is still here. I attempted to offer a non-anxious presence and non-judgmental supportive listening.Spiritual Assessment:Referral Source: Chaplain Initiated Information Obtained From: Patient Mood: Disappointed, Scientist, forensic SupportTypes of Relational Support: Very Ecologist Support: None or Very Little Religious Affiliation: Lutheran   Helpful Religious Practices: Life Designer, television/film set RaisedRaised by Patient: Length of Illness, Shared Story of Illness, Worsening Diagnosis, Eager to Return Home, Concerns about Dying     Spiritual Interventions:Spiritual Intervention Index**Date of Spiritual Visit: 06/29/21Visit Type: Follow Up VisitLanguage or special accommodation rendered?: NoHow Well Do You Speak English?: very wellIntervention Type: Spiritual VisitResponding Chaplain: InternSpiritual/Religious Support Provided: Bible/Religious Reading, Companionship, Religious Materials ProvidedOutcome: OUTCOMES: Expressed Spiritual/Religious Resources or Distress: Utilized spiritual resources/practices, Identified important values and beliefs, Expressed existential distress OUTCOMES: Expressed Emotional Resources or Distress: Expressed increased desire to heal OUTCOMES: Progressed Spiritually: Progressed toward trust, Progressed toward a sense of purpose OUTCOMES: Progressed Emotionally or Physically: Increased satisfaction     Plan: Pt expressed that he would appreciate another visit from me. I told him I would try my best to do so when I return on Thursday. Follow up as needed.    	Signed: Estelle Grumbles, Chaplain Intern6/29/202111:32 AMPlan of Care Overview/ Patient Status

## 2020-05-26 NOTE — Progress Notes
[facility] Progress NoteAttending Provider: Youlanda Mighty, MD  Subjective                                                                              Subjective: Interim History: 93% RAOverall feeling better, but still with difficulty catching his breath. Understands role of PE propagation in current illness. He spoke with Dr. Nedra Hai this AM.  Review of Allergies/Meds/Hx: Review of Allergies/Meds/Hx:I have reviewed the patient's: current scheduled medications, current infusions, current prn medications, past medical history and past surgical history Objective Objective: Vitals:Last 24 hours: Temp:  [97.5 ?F (36.4 ?C)-98.7 ?F (37.1 ?C)] 98.7 ?F (37.1 ?C)Pulse:  [75-104] 98Resp:  [18-20] 18BP: (114-142)/(64-82) 114/79SpO2:  [93 %-95 %] 93 %I/O's:Gross Totals (Last 24 hours) at 05/26/2020 1543Last data filed at 05/25/2020 1800Intake 220 ml Output -- Net 220 ml Procedures:None Physical Exam Well-nourished well-developed, sitting up in bed not wearing nasal cannulaNeck supple, no JVD, no lymphadenopathyLungs with good air movement, no wheezing, no rhonchiCardiovascular, regular rate, normal S1-S2, no murmurs rubs or gallops.Abdomen soft, nontender nondistendedExtremities warm well perfusedLabs:Last 24 hours: Recent Results (from the past 24 hour(s)) Comprehensive metabolic panel  Collection Time: 05/26/20  6:58 AM Result Value Ref Range  Sodium 140 136 - 145 mmol/L  Potassium 3.5 3.5 - 5.1 mmol/L  Chloride 104 95 - 115 mmol/L  CO2 27 21 - 32 mmol/L  Anion Gap 9 5 - 18  Glucose 99 70 - 100 mg/dL  BUN 26 (H) 8 - 25 mg/dL  Creatinine 5.40 9.81 - 1.30 mg/dL  Calcium 9.1 8.4 - 19.1 mg/dL  BUN/Creatinine Ratio 47.8 (H) 8.0 - 25.0  Total Protein 7.3 6.4 - 8.2 g/dL  Albumin 3.5 3.4 - 5.0 g/dL  Total Bilirubin 0.4 0.0 - 1.0 mg/dL  Alkaline Phosphatase 295 (H) 20 - 135 U/L Alanine Aminotransferase (ALT) 173 (H) 12 - 78 U/L  Aspartate Aminotransferase (AST) 78 (H) 5 - 37 U/L  Globulin 3.8 g/dL  A/G Ratio 0.9   AST/ALT Ratio 0.5 See Comment  eGFR (Afr Amer) >60 >60 mL/min/1.31m2  eGFR (NON African-American) >60 >60 mL/min/1.61m2  Osmolality Calculation 284 275 - 295 mOsm/kg CBC without differential  Collection Time: 05/26/20  8:23 AM Result Value Ref Range  WBC 9.9 3.8 - 10.6 x1000/?L  RBC 5.1 4.6 - 6.1 M/?L  Hemoglobin 15.4 13.7 - 18.0 g/dL  Hematocrit 62.1 30.8 - 54.0 %  MCV 92.9 80.0 - 99.0 fL  MCHC 32.6 32.0 - 36.0 g/dL  RDW-CV 65.7 84.6 - 96.2 %  Platelets 267 140 - 446 x1000/?L  MPV 10.5 9.8 - 12.3 fL  ANC (Abs Neutrophil Count) 5.8 1.8 - 7.3 x 1000/?L  MCH 30.3 25.7 - 31.0 pg Diagnostics:05/21/20 Chest [place] reviewed: ** ADDENDUM #1 **Comparison is made to prior CTA examinations dated 01/30/2020, 07/18/2019, 10/11/2018.?Postoperative changes of the right lower lobe are again identified. There is diminished contrast enhancement within the pulmonary artery to the right lower lobe which appears to have slightly propagated proximally since the prior exam (compare current study series 900 image 97 to the 01/30/2020 exam series 902 image 52). However, note that the 01/30/2020 finding appear improved from the 10/11/2018 exam.IMPRESSION:??1. No pulmonary embolism.?2. Partial resection of the right  lower lobe with abnormal soft tissue about the right lower lobe bronchovascular bundle. Neoplasm not excluded.?3. Emphysema.?4. Small right pleural effusion.?5. 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 Unclear precipitant.  No e/o pneumonia, viral PCR panel negative.  Today, indicates that he has had some swallowing difficulties as well with a sensation of pills/food getting stuck in his throat.  Recent EGD without overt pathology (GERD without esophagitis), per report available.  Improving though still hypoxemic.  Reviewed with Dr. Billie Ruddy yesterday, there has been propagation of his previously noted RLL PE.  Radiology report addended as aboveNow on lovenox.    I have reviewed the patient's problem list and updated it as needed. Plan Plan: COPD exacerbation.  Staph in sputumPrior RLL PE with propogation-Prednisone-Continue duonebs BID and albuterol PRN-Pulm consult appreciated-Wean O2 as tolerated-s/p MBS today, await interpretation -Would transition azithromycin to ceftin given Staph aureus in sputum and sluggish improvement.  If MRSA will need to adjust to doxycycline-D/W Dr. Nedra Hai, recommends transitioni to warfarin given propagation of clot on apixaban. Elevated alk phos, chronic, mildly progressive-GGT is normal. -Needs bone evaluation in follow up?Chronic pain-Continue with Lyrica and Dilaudid?HypertensionContinue hydrochlorothiazide 25 mg's daily.  BP well controlled.  K wnl Electronically Signed:Bayler Nehring Shanna Cisco, MD Beeper 38746/29/2021,3:59 PM
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
## 2020-05-27 ENCOUNTER — Encounter: Admit: 2020-05-27 | Payer: PRIVATE HEALTH INSURANCE | Attending: Medical Oncology | Primary: Family Medicine

## 2020-05-27 ENCOUNTER — Encounter: Admit: 2020-05-27 | Payer: PRIVATE HEALTH INSURANCE | Primary: Family Medicine

## 2020-05-27 DIAGNOSIS — I8291 Chronic embolism and thrombosis of unspecified vein: Secondary | ICD-10-CM

## 2020-05-27 DIAGNOSIS — Z9189 Other specified personal risk factors, not elsewhere classified: Secondary | ICD-10-CM

## 2020-05-27 LAB — CBC WITHOUT DIFFERENTIAL
BKR WAM ANALYZER ANC: 5.9 x 1000/ÂµL (ref 1.8–7.3)
BKR WAM HEMATOCRIT: 47 % (ref 41.0–54.0)
BKR WAM HEMOGLOBIN: 15.5 g/dL (ref 13.7–18.0)
BKR WAM MCH (PG): 30.2 pg (ref 25.7–31.0)
BKR WAM MCHC: 33 g/dL (ref 32.0–36.0)
BKR WAM MCV: 91.6 fL (ref 80.0–99.0)
BKR WAM MPV: 10 fL (ref 9.8–12.3)
BKR WAM PLATELETS: 282 x1000/ÂµL (ref 140–446)
BKR WAM RDW-CV: 13.5 % (ref 11.5–14.5)
BKR WAM RED BLOOD CELL COUNT: 5.1 M/ÂµL (ref 4.6–6.1)
BKR WAM WHITE BLOOD CELL COUNT: 10.6 x1000/ÂµL (ref 3.8–10.6)

## 2020-05-27 LAB — BASIC METABOLIC PANEL
BKR ANION GAP: 8 (ref 5–18)
BKR BLOOD UREA NITROGEN: 23 mg/dL (ref 8–25)
BKR BUN / CREAT RATIO: 24 (ref 8.0–25.0)
BKR CALCIUM: 9.2 mg/dL (ref 8.4–10.3)
BKR CHLORIDE: 106 mmol/L (ref 95–115)
BKR CO2: 28 mmol/L (ref 21–32)
BKR CREATININE: 0.96 mg/dL (ref 0.50–1.30)
BKR EGFR (AFR AMER): >60 mL/min/{1.73_m2} (ref >60)
BKR EGFR (NON AFRICAN AMERICAN): >60 mL/min/{1.73_m2} (ref >60)
BKR GLUCOSE: 104 mg/dL — ABNORMAL HIGH (ref 70–100)
BKR OSMOLALITY CALCULATION: 287 mosm/kg (ref 275–295)
BKR POTASSIUM: 3.5 mmol/L (ref 3.5–5.1)
BKR SODIUM: 142 mmol/L (ref 136–145)

## 2020-05-27 LAB — PROTIME AND INR
BKR INR: 1.04 (ref 0.88–1.14)
BKR PROTHROMBIN TIME: 11.4 s (ref 9.4–12.0)

## 2020-05-27 MED ORDER — PREDNISONE 20 MG TABLET
20 mg | ORAL_TABLET | 1 refills | Status: AC
Start: 2020-05-27 — End: 2020-06-11

## 2020-05-27 MED ORDER — WARFARIN 5 MG TABLET
5 mg | ORAL_TABLET | 1 refills | Status: SS
Start: 2020-05-27 — End: 2020-06-20

## 2020-05-27 MED ORDER — BUDESONIDE-FORMOTEROL HFA 80 MCG-4.5 MCG/ACTUATION AEROSOL INHALER
Freq: Two times a day (BID) | RESPIRATORY_TRACT | 1 refills | Status: SS
Start: 2020-05-27 — End: 2020-06-20

## 2020-05-27 MED ORDER — CEFUROXIME AXETIL 500 MG TABLET
500 mg | ORAL_TABLET | Freq: Two times a day (BID) | ORAL | 1 refills | Status: AC
Start: 2020-05-27 — End: ?

## 2020-05-27 MED ORDER — ENOXAPARIN 120 MG/0.8 ML SUBCUTANEOUS SYRINGE
120 mg/0.8 mL | INJECTION | Freq: Two times a day (BID) | SUBCUTANEOUS | 2 refills | Status: AC
Start: 2020-05-27 — End: ?

## 2020-05-27 MED ORDER — PREGABALIN 50 MG CAPSULE
50 mg | ORAL_CAPSULE | Freq: Two times a day (BID) | ORAL | 3 refills | Status: SS
Start: 2020-05-27 — End: 2020-06-20

## 2020-05-27 NOTE — Plan of Care
Plan of Care Overview/ Patient Status    Poss DC today per Dr. Christell Faith .Pt will need to be on Lovenox injections .Met with pt , he understood plan . VNS offered but he politely declined . He will be f/u with his MDs closely .RN to give instructions/teachings re- Lovenox injection self administration.

## 2020-05-27 NOTE — Progress Notes
Pulmonary Follow-upFeels well.  Walking around the hall without oxygen.BP 114/77  - Pulse 83  - Temp 97.6 ?F (36.4 ?C)  - Resp 18  - Ht 6' (1.829 m)  - Wt 111.2 kg  - SpO2 (!) 93%  - BMI 33.25 kg/m? Alert and fully engaged.No prolongation of the expiratory phase.No wheezes, rales or rhonchiRecent Labs   06/29/210658 06/30/210621 NA 140 142 K 3.5 3.5 CL 104 106 CO2 27 28 BUN 26* 23 CREATININE 0.93 0.96 GLU 99 104* Recent Labs   06/29/210823 06/30/210621 WBC 9.9 10.6 HGB 15.4 15.5 PLT 267 282  ACOPDAdmitted with bronchospastic exacerbation.Giant cell interstitial pneumonia was found when a lung nodule was biopsiedRight lower lobe thrombosis in the region of his surgeryPAgree with plan for antibiotics.Appointment offered with me in 1 to 2 weeks.Henry Buhrman Leibert6/30/2021 10:28 AMBeeper [phone], Extension [phone] [phone] for any questions

## 2020-05-27 NOTE — Plan of Care
Plan of Care Overview/ Patient Status    PT A/O x4. OOB independent RA, 95%. Medications administered per mar, tolerated well. PT C/O back pain, PRN Dilaudid given. Pt in bed resting, call bell within reach, bed alarm refused, will continue to monitor.Problem: Adult Inpatient Plan of CareGoal: Plan of Care ReviewOutcome: Interventions implemented as appropriateGoal: Patient-Specific Goal (Individualized)Outcome: Interventions implemented as appropriateGoal: Absence of Hospital-Acquired Illness or InjuryOutcome: Interventions implemented as appropriateGoal: Optimal Comfort and WellbeingOutcome: Interventions implemented as appropriateGoal: Readiness for Transition of CareOutcome: Interventions implemented as appropriate Problem: Adjustment to Illness COPD (Chronic Obstructive Pulmonary Disease)Goal: Optimal Chronic Illness CopingOutcome: Interventions implemented as appropriate Problem: Functional Ability Impaired COPD (Chronic Obstructive Pulmonary Disease)Goal: Optimal Level of Functional IndependenceOutcome: Interventions implemented as appropriate Problem: Infection COPD (Chronic Obstructive Pulmonary Disease)Goal: Absence of Infection Signs and SymptomsOutcome: Interventions implemented as appropriate Problem: Oral Intake Inadequate COPD (Chronic Obstructive Pulmonary Disease)Goal: Improved Nutrition IntakeOutcome: Interventions implemented as appropriate Problem: Respiratory Compromise COPD (Chronic Obstructive Pulmonary Disease)Goal: Effective Oxygenation and VentilationOutcome: Interventions implemented as appropriate Problem: Gas Exchange ImpairedGoal: Optimal Gas ExchangeOutcome: Interventions implemented as appropriate Problem: Urinary RetentionGoal: Effective Urinary EliminationOutcome: Interventions implemented as appropriate Problem: Fall Injury RiskGoal: Absence of Fall and Fall-Related InjuryOutcome: Interventions implemented as appropriate Problem: Skin Injury Risk IncreasedGoal: Skin Health and IntegrityOutcome: Interventions implemented as appropriate

## 2020-05-27 NOTE — Plan of Care
Plan of Care Overview/ Patient Status    Assumed care at 0700, pt received in bed a/o x 4, denies pain or discomfort at the moment, on room air saturating at 94%, VSS, pt ambulating independently;y in room, last B< 6/29//21, am meds given as per [facility], encouraged physical activity as tolerated, rest as needed, pt is medically stable for d/c as per MD orders, pt will be d/c with a Lovenox subcutaneous injection regimen, pt was instructed how to properly self-administered injections, reviewed dosages and schedule times, pt demonstrated and verbalized understanding of all given instructions, all belongings reviewed returned, d/c disposition reviewed, pt verbalized understanding all given instructions. Pt left the unit at.Problem: Adult Inpatient Plan of CareGoal: Plan of Care ReviewOutcome: Interventions implemented as appropriateFlowsheets (Taken 05/27/2020 1007)Progress: improvingPlan of Care Reviewed With: patientGoal: Patient-Specific Goal (Individualized)Outcome: Interventions implemented as appropriateGoal: Absence of Hospital-Acquired Illness or InjuryOutcome: Interventions implemented as appropriateGoal: Optimal Comfort and WellbeingOutcome: Interventions implemented as appropriateGoal: Readiness for Transition of CareOutcome: Interventions implemented as appropriate

## 2020-05-27 NOTE — Telephone Encounter
ERROR

## 2020-05-27 NOTE — Discharge Instructions
You were admitted with shortness of breath which was due to a combination of a COPD exacerbation as well as extension of your prior pulmonary embolism. You were treated with steroids, nebulizers, antibiotics, and now your Eliquis has been changed to Warfarin.  Upon discharge, please do the following: 1. Give yourself lovenox injections tonight and tomorrow morning2. Take 10 mg (2 pills) of warfarin tonight3. Go to Dr. Marigene Ehlers office tomorrow to have your blood drawn to test your coumadin level4. Follow Dr. Marigene Ehlers instructions for subsequent warfarin and lovenox dosing.  Once your coumadin level (called INR) is above 2 for 2 days, you can stop the lovenox.  5. Complete 4 more days of the antibiotic, Ceftin. 6. Taper Prednisone as follows:  Take 40 mg for 3 days, then 30 mg for 3 days, then 20 mg for 3 days, then 10 mg for 3 days, then stop.  7. Follow up with Dr. Billie Ruddy as scheduled.  8. Make an appointment to see the ear nose and throat doctor, Dr. Jonna Clark can contact [phone] for any questions regarding your care by the Hospitalist team.

## 2020-05-28 ENCOUNTER — Ambulatory Visit: Admit: 2020-05-28 | Payer: PRIVATE HEALTH INSURANCE | Attending: Medical Oncology | Primary: Family Medicine

## 2020-05-28 ENCOUNTER — Inpatient Hospital Stay: Admit: 2020-05-28 | Discharge: 2020-05-28 | Payer: MEDICARE | Primary: Family Medicine

## 2020-05-28 DIAGNOSIS — Z981 Arthrodesis status: Secondary | ICD-10-CM

## 2020-05-28 DIAGNOSIS — N319 Neuromuscular dysfunction of bladder, unspecified: Secondary | ICD-10-CM

## 2020-05-28 DIAGNOSIS — R1314 Dysphagia, pharyngoesophageal phase: Secondary | ICD-10-CM

## 2020-05-28 DIAGNOSIS — E785 Hyperlipidemia, unspecified: Secondary | ICD-10-CM

## 2020-05-28 DIAGNOSIS — Z85828 Personal history of other malignant neoplasm of skin: Secondary | ICD-10-CM

## 2020-05-28 DIAGNOSIS — F17210 Nicotine dependence, cigarettes, uncomplicated: Secondary | ICD-10-CM

## 2020-05-28 DIAGNOSIS — J439 Emphysema, unspecified: Secondary | ICD-10-CM

## 2020-05-28 DIAGNOSIS — K760 Fatty (change of) liver, not elsewhere classified: Secondary | ICD-10-CM

## 2020-05-28 DIAGNOSIS — J9621 Acute and chronic respiratory failure with hypoxia: Secondary | ICD-10-CM

## 2020-05-28 DIAGNOSIS — K219 Gastro-esophageal reflux disease without esophagitis: Secondary | ICD-10-CM

## 2020-05-28 DIAGNOSIS — J849 Interstitial pulmonary disease, unspecified: Secondary | ICD-10-CM

## 2020-05-28 DIAGNOSIS — I10 Essential (primary) hypertension: Secondary | ICD-10-CM

## 2020-05-28 DIAGNOSIS — Z7901 Long term (current) use of anticoagulants: Secondary | ICD-10-CM

## 2020-05-28 DIAGNOSIS — I2782 Chronic pulmonary embolism: Secondary | ICD-10-CM

## 2020-05-28 DIAGNOSIS — N400 Enlarged prostate without lower urinary tract symptoms: Secondary | ICD-10-CM

## 2020-05-28 DIAGNOSIS — I2699 Other pulmonary embolism without acute cor pulmonale: Secondary | ICD-10-CM

## 2020-05-28 DIAGNOSIS — Z8711 Personal history of peptic ulcer disease: Secondary | ICD-10-CM

## 2020-05-28 DIAGNOSIS — E669 Obesity, unspecified: Secondary | ICD-10-CM

## 2020-05-28 DIAGNOSIS — R0602 Shortness of breath: Secondary | ICD-10-CM

## 2020-05-28 DIAGNOSIS — Z79899 Other long term (current) drug therapy: Secondary | ICD-10-CM

## 2020-05-28 DIAGNOSIS — E7800 Pure hypercholesterolemia, unspecified: Secondary | ICD-10-CM

## 2020-05-28 DIAGNOSIS — Z6833 Body mass index (BMI) 33.0-33.9, adult: Secondary | ICD-10-CM

## 2020-05-28 DIAGNOSIS — I8291 Chronic embolism and thrombosis of unspecified vein: Secondary | ICD-10-CM

## 2020-05-28 LAB — ANTIPHOSPHOLIPID ANTIBODY PANEL     (GH LMW Q)
B2-GLYCOPROTEIN IGA: <2 U/mL (ref <20.0)
B2-GLYCOPROTEIN IGG: <2 U/mL (ref <20.0)
B2-GLYCOPROTEIN IGM: 2.3 U/mL (ref <20.0)
CARDIOLIPIN IGA: <2 [APL'U]/mL (ref <20.0)
CARDIOLIPIN IGG: <2 [GPL'U]/mL (ref <20.0)
PHOSPHATIDYLSERINE AB IGA: <20 U/mL (ref <20)
PHOSPHATIDYLSERINE AB IGG: <10 U/mL (ref <10)
PHOSPHATIDYLSERINE AB IGM: <25 U/mL (ref <25)

## 2020-05-28 LAB — LOWER RESP CULTURE, QUAL

## 2020-05-28 LAB — POCT INR     (GH): BKR INTERNATIONAL NORMALIZATION RATIO, POC: 1.3
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
## 2020-06-02 ENCOUNTER — Encounter: Admit: 2020-06-02 | Payer: PRIVATE HEALTH INSURANCE | Primary: Family Medicine

## 2020-06-02 ENCOUNTER — Encounter: Admit: 2020-06-02 | Payer: PRIVATE HEALTH INSURANCE | Attending: Medical Oncology | Primary: Family Medicine

## 2020-06-02 ENCOUNTER — Inpatient Hospital Stay: Admit: 2020-06-02 | Discharge: 2020-06-02 | Payer: MEDICARE | Primary: Family Medicine

## 2020-06-02 DIAGNOSIS — Z7901 Long term (current) use of anticoagulants: Secondary | ICD-10-CM

## 2020-06-02 DIAGNOSIS — I2699 Other pulmonary embolism without acute cor pulmonale: Secondary | ICD-10-CM

## 2020-06-02 LAB — POCT INR     (GH): BKR INTERNATIONAL NORMALIZATION RATIO, POC: 2.5

## 2020-06-02 MED ORDER — WARFARIN 2.5 MG TABLET
2.5 mg | ORAL_TABLET | Freq: Every day | ORAL | 2 refills | Status: AC
Start: 2020-06-02 — End: 2020-06-03

## 2020-06-02 NOTE — Progress Notes
Patient arrived at the office for INR recheck, missed saturdays drawn at [facility], no orders in system. Has been taking 10mg  dose as instructed previously, INR today is 2.5 Dr Nedra Hai informed; new instructions given different from clinical note on 7/1.Patient will take 7.5mg  this evening, wed, and Thursday and return to the office on Friday for repeat INR.

## 2020-06-03 ENCOUNTER — Encounter: Admit: 2020-06-03 | Payer: PRIVATE HEALTH INSURANCE | Primary: Family Medicine

## 2020-06-03 DIAGNOSIS — I2699 Other pulmonary embolism without acute cor pulmonale: Secondary | ICD-10-CM

## 2020-06-03 MED ORDER — WARFARIN 2.5 MG TABLET
2.5 mg | ORAL_TABLET | Freq: Every day | ORAL | 1 refills | Status: AC
Start: 2020-06-03 — End: 2020-06-11

## 2020-06-04 ENCOUNTER — Telehealth: Admit: 2020-06-04 | Payer: PRIVATE HEALTH INSURANCE | Attending: Urology | Primary: Family Medicine

## 2020-06-04 NOTE — Telephone Encounter
[facility]  orders Please complete & fax back to facility

## 2020-06-04 NOTE — Telephone Encounter
Forms printed and placed on your desk.

## 2020-06-05 ENCOUNTER — Telehealth: Admit: 2020-06-05 | Payer: PRIVATE HEALTH INSURANCE | Primary: Family Medicine

## 2020-06-05 ENCOUNTER — Inpatient Hospital Stay: Admit: 2020-06-05 | Discharge: 2020-06-05 | Payer: MEDICARE | Primary: Family Medicine

## 2020-06-05 ENCOUNTER — Encounter: Admit: 2020-06-05 | Payer: PRIVATE HEALTH INSURANCE | Primary: Family Medicine

## 2020-06-05 DIAGNOSIS — I2699 Other pulmonary embolism without acute cor pulmonale: Secondary | ICD-10-CM

## 2020-06-05 LAB — POCT INR     (GH): BKR INTERNATIONAL NORMALIZATION RATIO, POC: 3.6

## 2020-06-05 NOTE — Telephone Encounter
-----   Message from Isaias Cowman, MD sent at 06/05/2020  3:16 PM EDT -----Regarding: RE: Alger Memos thanks----- Message -----From: Edd Arbour, RNSent: 06/05/2020   2:36 PM EDTTo: Isaias Cowman, MDSubject: INR                                          INR is  3.6Only took 7.5mg   on Tuesday wed and yesterday.Hold for one or two days and drop down to 5mg   Recheck on Tuesday morning?

## 2020-06-05 NOTE — Telephone Encounter
Pt informed to hold for one day and drop coumadin dose to 5mg  daily and recheck on Tuesday morning.----- Message from Isaias Cowman, MD sent at 06/05/2020  3:16 PM EDT -----Regarding: RE: Alger Memos thanks----- Message -----From: Edd Arbour, RNSent: 06/05/2020   2:36 PM EDTTo: Isaias Cowman, MDSubject: INR                                          INR is  3.6Only took 7.5mg   on Tuesday wed and yesterday.Hold for one dayand drop down to 5mg   Recheck on Tuesday morning.

## 2020-06-05 NOTE — Telephone Encounter
Pt informed----- Message from Isaias Cowman, MD sent at 06/05/2020  3:16 PM EDT -----Regarding: RE: [name] thanks----- Message -----From: Edd Arbour, RNSent: 06/05/2020   2:36 PM EDTTo: Isaias Cowman, MDSubject: INR                                          INR is  3.6Only took 7.5mg   on Tuesday wed and yesterday.Hold for one and drop down to 5mg   Recheck on Tuesday morning

## 2020-06-08 NOTE — Telephone Encounter
Faxed forms to [employer]. Patient has not been seen since 09/2019 . Patient needs to make appt.. [facility] needed

## 2020-06-08 NOTE — Telephone Encounter
Called patient and left a message on his voice mail to make appointment with our PA. There is time on Thursday and Friday of this week.

## 2020-06-08 NOTE — Telephone Encounter
Patient return call / appt. Made for 07-15 at 8:15am w/ [place]

## 2020-06-09 ENCOUNTER — Telehealth: Admit: 2020-06-09 | Payer: PRIVATE HEALTH INSURANCE | Primary: Family Medicine

## 2020-06-09 ENCOUNTER — Inpatient Hospital Stay: Admit: 2020-06-09 | Discharge: 2020-06-09 | Payer: MEDICARE | Primary: Family Medicine

## 2020-06-09 DIAGNOSIS — I2699 Other pulmonary embolism without acute cor pulmonale: Secondary | ICD-10-CM

## 2020-06-09 LAB — POCT INR     (GH): BKR INTERNATIONAL NORMALIZATION RATIO, POC: 2

## 2020-06-10 NOTE — Telephone Encounter
Patient arrived at the office this morning for further instructions; he received the message from his brother, he was instructed again to continue with 5 mg of coumadin and return next Tuesday for repeat INR, if this reading is therpeutic we will move to two week recheck.Attempted to reach patient mailbox is full on mobile phone; contacted patients brother lars and informed to tell patient to continue with 5mg  coumadin tonight, INR is therapeutic at 2.0 and he should call me in the morning for  Further instructions.

## 2020-06-11 ENCOUNTER — Encounter: Admit: 2020-06-11 | Payer: PRIVATE HEALTH INSURANCE | Attending: Medical | Primary: Family Medicine

## 2020-06-11 ENCOUNTER — Inpatient Hospital Stay: Admit: 2020-06-11 | Discharge: 2020-06-11 | Payer: MEDICARE | Primary: Family Medicine

## 2020-06-11 ENCOUNTER — Ambulatory Visit: Admit: 2020-06-11 | Payer: MEDICARE | Attending: Medical | Primary: Family Medicine

## 2020-06-11 DIAGNOSIS — N319 Neuromuscular dysfunction of bladder, unspecified: Secondary | ICD-10-CM

## 2020-06-11 DIAGNOSIS — R339 Retention of urine, unspecified: Secondary | ICD-10-CM

## 2020-06-11 DIAGNOSIS — R338 Other retention of urine: Secondary | ICD-10-CM

## 2020-06-11 DIAGNOSIS — C4491 Basal cell carcinoma of skin, unspecified: Secondary | ICD-10-CM

## 2020-06-11 DIAGNOSIS — K760 Fatty (change of) liver, not elsewhere classified: Secondary | ICD-10-CM

## 2020-06-11 DIAGNOSIS — Z789 Other specified health status: Secondary | ICD-10-CM

## 2020-06-11 DIAGNOSIS — S060XAA Concussion: Secondary | ICD-10-CM

## 2020-06-11 DIAGNOSIS — N390 Urinary tract infection, site not specified: Secondary | ICD-10-CM

## 2020-06-11 DIAGNOSIS — Z125 Encounter for screening for malignant neoplasm of prostate: Secondary | ICD-10-CM

## 2020-06-11 DIAGNOSIS — N401 Enlarged prostate with lower urinary tract symptoms: Secondary | ICD-10-CM

## 2020-06-11 DIAGNOSIS — N23 Unspecified renal colic: Secondary | ICD-10-CM

## 2020-06-11 DIAGNOSIS — N400 Enlarged prostate without lower urinary tract symptoms: Secondary | ICD-10-CM

## 2020-06-11 DIAGNOSIS — R319 Hematuria, unspecified: Secondary | ICD-10-CM

## 2020-06-11 DIAGNOSIS — K219 Gastro-esophageal reflux disease without esophagitis: Secondary | ICD-10-CM

## 2020-06-11 DIAGNOSIS — Z5189 Encounter for other specified aftercare: Secondary | ICD-10-CM

## 2020-06-11 DIAGNOSIS — J449 Chronic obstructive pulmonary disease, unspecified: Secondary | ICD-10-CM

## 2020-06-11 DIAGNOSIS — M720 Palmar fascial fibromatosis [Dupuytren]: Secondary | ICD-10-CM

## 2020-06-11 DIAGNOSIS — I10 Essential (primary) hypertension: Secondary | ICD-10-CM

## 2020-06-11 DIAGNOSIS — A419 Sepsis, unspecified organism: Secondary | ICD-10-CM

## 2020-06-11 DIAGNOSIS — G43909 Migraine, unspecified, not intractable, without status migrainosus: Secondary | ICD-10-CM

## 2020-06-11 DIAGNOSIS — N312 Flaccid neuropathic bladder, not elsewhere classified: Secondary | ICD-10-CM

## 2020-06-11 DIAGNOSIS — R092 Respiratory arrest: Secondary | ICD-10-CM

## 2020-06-11 DIAGNOSIS — E7800 Pure hypercholesterolemia, unspecified: Secondary | ICD-10-CM

## 2020-06-11 DIAGNOSIS — M1990 Unspecified osteoarthritis, unspecified site: Secondary | ICD-10-CM

## 2020-06-11 DIAGNOSIS — Z8711 Personal history of peptic ulcer disease: Secondary | ICD-10-CM

## 2020-06-11 DIAGNOSIS — R3989 Other symptoms and signs involving the genitourinary system: Secondary | ICD-10-CM

## 2020-06-11 LAB — URINALYSIS-MACROSCOPIC W/REFLEX MICROSCOPIC
BKR BILIRUBIN, UA: NEGATIVE /HPF
BKR GLUCOSE, UA: NEGATIVE
BKR KETONES, UA: NEGATIVE /HPF
BKR LEUKOCYTE ESTERASE, UA: NEGATIVE
BKR NITRITE, UA: NEGATIVE
BKR PH, UA: 6 (ref 5.5–7.5)
BKR UROBILINOGEN, UA: <2 EU/dL (ref <=2.0)

## 2020-06-11 LAB — URINE MICROSCOPIC     (BH GH LMW YH)
BKR HYALINE CASTS, UA INSTRUMENT (NUMERIC): 2 /LPF (ref 0–3)
BKR RBC/HPF INSTRUMENT: 2 /HPF (ref 0–2)
BKR WBC/HPF INSTRUMENT: 3 /HPF — AB (ref 0–5)

## 2020-06-11 LAB — PSA, TOTAL (SCREENING) (BH GH L LMW YH): BKR PROSTATE SPECIFIC ANTIGEN, SCREENING: 3.16 ng/mL (ref 0.010–4.000)

## 2020-06-11 MED ORDER — TADALAFIL 5 MG TABLET
5 mg | ORAL_TABLET | Freq: Every day | ORAL | 12 refills | Status: AC
Start: 2020-06-11 — End: 2020-06-26
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
## 2020-06-12 ENCOUNTER — Telehealth: Admit: 2020-06-12 | Payer: PRIVATE HEALTH INSURANCE | Attending: Medical | Primary: Family Medicine

## 2020-06-12 NOTE — Telephone Encounter
[facility] pharmacy is requesting prior authorization on Tadalafil 5mg  tablets. Please see attached form below and in media.

## 2020-06-12 NOTE — Telephone Encounter
reviewed

## 2020-06-12 NOTE — Telephone Encounter
Reviewed negative UA and PSA 3.1 mild increase from 2.41 in 2019. Will continue PSA monitoring annually

## 2020-06-13 LAB — URINE CULTURE
BKR SPECIFIC GRAVITY, UA: NO GROWTH (ref 1.005–1.030)
BKR URINE CULTURE, ROUTINE: NO GROWTH

## 2020-06-15 ENCOUNTER — Inpatient Hospital Stay: Admit: 2020-06-15 | Discharge: 2020-06-20 | Payer: MEDICARE | Source: Home / Self Care | Admitting: Internal Medicine

## 2020-06-15 ENCOUNTER — Emergency Department: Admit: 2020-06-15 | Payer: PRIVATE HEALTH INSURANCE | Primary: Family Medicine

## 2020-06-15 ENCOUNTER — Inpatient Hospital Stay: Admit: 2020-06-15 | Payer: PRIVATE HEALTH INSURANCE

## 2020-06-15 DIAGNOSIS — J9691 Respiratory failure, unspecified with hypoxia: Secondary | ICD-10-CM

## 2020-06-15 LAB — CBC WITH AUTO DIFFERENTIAL
BKR WAM ABSOLUTE IMMATURE GRANULOCYTES: 0 x 1000/ÂµL (ref 0.0–0.2)
BKR WAM ABSOLUTE LYMPHOCYTE COUNT: 1.2 x 1000/ÂµL (ref 1.0–2.3)
BKR WAM ABSOLUTE NRBC: 0 x 1000/??L (ref <=0.0)
BKR WAM ANALYZER ANC: 5.6 x 1000/ÂµL (ref 1.8–7.3)
BKR WAM BASOPHIL ABSOLUTE COUNT: 0 x 1000/ÂµL (ref 0.0–0.6)
BKR WAM BASOPHILS: 0.3 % (ref 0.0–2.0)
BKR WAM EOSINOPHIL ABSOLUTE COUNT: 0.3 x 1000/ÂµL (ref 0.0–0.4)
BKR WAM EOSINOPHILS: 3.6 % — ABNORMAL LOW (ref 0.0–6.0)
BKR WAM HEMATOCRIT: 41.3 % (ref 41.0–54.0)
BKR WAM HEMOGLOBIN: 13.1 g/dL — ABNORMAL LOW (ref 13.7–18.0)
BKR WAM IMMATURE GRANULOCYTES: 0.3 % (ref 0.0–2.0)
BKR WAM LYMPHOCYTES: 16 % (ref 14.0–43.0)
BKR WAM MCH (PG): 30 pg (ref 25.7–31.0)
BKR WAM MCHC: 31.7 g/dL — ABNORMAL LOW (ref 32.0–36.0)
BKR WAM MCV: 94.7 fL (ref 80.0–99.0)
BKR WAM MONOCYTE ABSOLUTE COUNT: 0.4 x 1000/ÂµL (ref 0.4–1.3)
BKR WAM MONOCYTES: 5.3 % (ref 0.0–14.0)
BKR WAM MPV: 9.9 fL (ref 9.8–12.3)
BKR WAM NEUTROPHILS: 74.5 % — ABNORMAL HIGH (ref 38.0–74.0)
BKR WAM NUCLEATED RED BLOOD CELLS: 0 % (ref 0.0–0.2)
BKR WAM PLATELETS: 271 x1000/ÂµL (ref 140–446)
BKR WAM RDW-CV: 14.1 % (ref 11.5–14.5)
BKR WAM RED BLOOD CELL COUNT: 4.4 M/??L — ABNORMAL LOW (ref 4.6–6.1)
BKR WAM WHITE BLOOD CELL COUNT: 7.6 x1000/??L (ref 3.8–10.6)

## 2020-06-15 LAB — COMPREHENSIVE METABOLIC PANEL
BKR A/G RATIO: 0.9 % (ref 0.0–0.2)
BKR ALANINE AMINOTRANSFERASE (ALT): 84 U/L — ABNORMAL HIGH (ref 12–78)
BKR ALBUMIN: 3.3 g/dL — ABNORMAL LOW (ref 3.4–5.0)
BKR ALKALINE PHOSPHATASE: 167 U/L — ABNORMAL HIGH (ref 20–135)
BKR ANION GAP: 7 (ref 5–18)
BKR ASPARTATE AMINOTRANSFERASE (AST): 41 U/L — ABNORMAL HIGH (ref 5–37)
BKR AST/ALT RATIO: 0.5
BKR BILIRUBIN TOTAL: 0.4 mg/dL (ref 0.0–1.0)
BKR BLOOD UREA NITROGEN: 14 mg/dL (ref 8–25)
BKR BUN / CREAT RATIO: 15.1 % (ref 8.0–25.0)
BKR CALCIUM: 9.7 mg/dL (ref 8.4–10.3)
BKR CHLORIDE: 102 mmol/L (ref 95–115)
BKR CO2: 34 mmol/L — ABNORMAL HIGH (ref 21–32)
BKR CREATININE: 0.93 mg/dL (ref 0.50–1.30)
BKR EGFR (AFR AMER): >60 mL/min/{1.73_m2} (ref >60)
BKR EGFR (NON AFRICAN AMERICAN): >60 mL/min/{1.73_m2} (ref >60)
BKR GLOBULIN: 3.6 g/dL (ref 0.0–0.2)
BKR GLUCOSE: 182 mg/dL — ABNORMAL HIGH (ref 70–100)
BKR OSMOLALITY CALCULATION: 290 mOsm/kg (ref 275–295)
BKR POTASSIUM: 3.2 mmol/L — ABNORMAL LOW (ref 3.5–5.1)
BKR PROTEIN TOTAL: 6.9 g/dL (ref 6.4–8.2)
BKR SODIUM: 143 mmol/L (ref 136–145)

## 2020-06-15 LAB — BLOOD GAS, ARTERIAL     (GH)
BKR BASE EXCESS ARTERIAL: 7 mmol/L — ABNORMAL HIGH (ref ?–2)
BKR CARBOXYHEMOGLOBIN, ARTERIAL: 1.2 % (ref 0.5–1.5)
BKR HCO3, ARTERIAL: 30.3 mmol/L — ABNORMAL HIGH (ref 20.0–26.0)
BKR METHEMOGLOBIN, ARTERIAL: 0.7 % (ref 0.0–1.5)
BKR O2 SATURATION, ARTERIAL: 83 % — CL (ref 95–97)
BKR PATIENT TEMP: 37 Celsius
BKR PCO2 ARTERIAL: 37 mmHg (ref 35–45)
BKR PH, ARTERIAL: 7.52 units — ABNORMAL HIGH (ref 7.35–7.45)
BKR PO2, ARTERIAL: 47 mmHg — CL (ref 75–100)

## 2020-06-15 LAB — ZZZURINALYSIS WITH CULTURE REFLEX     (L Q)
BKR BILIRUBIN, UA: NEGATIVE
BKR BLOOD, UA: NEGATIVE U/L — ABNORMAL HIGH (ref 5–37)
BKR GLUCOSE, UA: NEGATIVE U/L — ABNORMAL HIGH (ref 20–135)
BKR KETONES, UA: NEGATIVE x 1000/??L — ABNORMAL HIGH (ref 0.0–0.6)
BKR LEUKOCYTE ESTERASE, UA: NEGATIVE
BKR NITRITE, UA: NEGATIVE
BKR PH, UA: 7 (ref 5.5–7.5)
BKR PROTEIN, UA: NEGATIVE
BKR SPECIFIC GRAVITY, UA: 1.015 g/dL (ref 1.005–1.030)
BKR UROBILINOGEN, UA: <2 EU/dL (ref <=2.0)

## 2020-06-15 LAB — NT-PROBNPE
BKR B-TYPE NATRIURETIC PEPTIDE, PRO (PROBNP): 136 pg/mL (ref 0.0–900.0)
BKR B-TYPE NATRIURETIC PEPTIDE, PRO (PROBNP): 131 pg/mL (ref 0.0–900.0)

## 2020-06-15 LAB — LACTIC ACID, PLASMA (REFLEX 2H REPEAT): BKR LACTATE: 3.8 mmol/L — ABNORMAL HIGH (ref 0.4–2.0)

## 2020-06-15 LAB — SARS COV-2 (COVID-19) RNA-~~LOC~~ LABS (BH GH LMW YH): BKR SARS-COV-2 RNA (COVID-19) (YH): NEGATIVE

## 2020-06-15 LAB — CARDIAC PROFILE (TROPONIN I REFLEX CK/CKMB)
BKR TROPONIN I: <0.015 ng/mL — ABNORMAL LOW (ref 0.000–1.500)
BKR TROPONIN I: <0.015 ng/mL (ref 0.000–1.500)

## 2020-06-15 LAB — REFLEX LACTIC ACID, PLASMA: BKR LACTATE: 1.5 mmol/L — ABNORMAL HIGH (ref 0.4–2.0)

## 2020-06-15 LAB — PROTIME AND INR
BKR INR: 2.16 — ABNORMAL HIGH (ref 0.88–1.14)
BKR PROTHROMBIN TIME: 22.2 seconds — ABNORMAL HIGH (ref 9.4–12.0)

## 2020-06-15 LAB — PROCALCITONIN     (BH GH LMW Q YH): BKR PROCALCITONIN: 0.09 ng/mL

## 2020-06-15 MED ORDER — PREGABALIN 100 MG CAPSULE
100 mg | Freq: Two times a day (BID) | ORAL | Status: DC
Start: 2020-06-15 — End: 2020-06-20
  Administered 2020-06-16 – 2020-06-20 (×10): 100 mg via ORAL

## 2020-06-15 MED ORDER — IOHEXOL 350 MG IODINE/ML INTRAVENOUS SOLUTION
350 mg iodine/mL | Freq: Once | INTRAVENOUS | Status: CP | PRN
Start: 2020-06-15 — End: ?
  Administered 2020-06-15: 20:00:00 350 mL via INTRAVENOUS

## 2020-06-15 MED ORDER — SODIUM CHLORIDE 0.9 % (FLUSH) INJECTION SYRINGE
0.9 % | INTRAVENOUS | Status: DC | PRN
Start: 2020-06-15 — End: 2020-06-20
  Administered 2020-06-17: 22:00:00 0.9 mL via INTRAVENOUS

## 2020-06-15 MED ORDER — IPRATROPIUM 0.5 MG-ALBUTEROL 3 MG (2.5 MG BASE)/3 ML NEBULIZATION SOLN
0.5-32.53 mg-3 mg(2.5 mg base)/3 mL | Freq: Four times a day (QID) | RESPIRATORY_TRACT | Status: DC
Start: 2020-06-15 — End: 2020-06-20
  Administered 2020-06-16 – 2020-06-20 (×14): 0.5 mL via RESPIRATORY_TRACT

## 2020-06-15 MED ORDER — CEFTRIAXONE IV PUSH 1000 MG VIAL & NS (ADULTS)
INTRAVENOUS | Status: CP
Start: 2020-06-15 — End: ?
  Administered 2020-06-16 – 2020-06-18 (×3): 10.000 mL via INTRAVENOUS

## 2020-06-15 MED ORDER — PANTOPRAZOLE 40 MG TABLET,DELAYED RELEASE
40 mg | Freq: Two times a day (BID) | ORAL | Status: DC
Start: 2020-06-15 — End: 2020-06-16
  Administered 2020-06-16: 13:00:00 40 mg via ORAL

## 2020-06-15 MED ORDER — POTASSIUM CHLORIDE ER 20 MEQ TABLET,EXTENDED RELEASE(PART/CRYST)
20 MEQ | Freq: Once | ORAL | Status: CP
Start: 2020-06-15 — End: ?
  Administered 2020-06-15: 19:00:00 20 MEQ via ORAL

## 2020-06-15 MED ORDER — SODIUM CHLORIDE 0.9 % (FLUSH) INJECTION SYRINGE
0.9 % | Freq: Three times a day (TID) | INTRAVENOUS | Status: DC
Start: 2020-06-15 — End: 2020-06-20
  Administered 2020-06-16 – 2020-06-19 (×3): 0.9 mL via INTRAVENOUS

## 2020-06-15 MED ORDER — BUDESONIDE-FORMOTEROL HFA 160 MCG-4.5 MCG/ACTUATION AEROSOL INHALER
160-4.5 mcg/actuation | Freq: Two times a day (BID) | RESPIRATORY_TRACT | Status: DC
Start: 2020-06-15 — End: 2020-06-20

## 2020-06-15 MED ORDER — ACETAMINOPHEN 325 MG TABLET
325 mg | Freq: Four times a day (QID) | ORAL | Status: DC | PRN
Start: 2020-06-15 — End: 2020-06-20
  Administered 2020-06-16 – 2020-06-20 (×11): 325 mg via ORAL

## 2020-06-15 MED ORDER — POTASSIUM CHLORIDE ER 10 MEQ TABLET,EXTENDED RELEASE(PART/CRYST)
10 MEQ | Freq: Every day | ORAL | Status: DC
Start: 2020-06-15 — End: 2020-06-20
  Administered 2020-06-16 – 2020-06-20 (×5): 10 MEQ via ORAL

## 2020-06-15 MED ORDER — SENNOSIDES 8.6 MG TABLET
8.6 mg | Freq: Every day | ORAL | Status: AC
Start: 2020-06-15 — End: ?

## 2020-06-15 MED ORDER — METHYLPREDNISOLONE SOD SUCC (PF) 125 MG/2 ML SOLUTION FOR INJECTION
125 mg/2 mL | Freq: Once | INTRAVENOUS | Status: CP
Start: 2020-06-15 — End: ?
  Administered 2020-06-15: 18:00:00 125 mL via INTRAVENOUS

## 2020-06-15 MED ORDER — AZITHROMYCIN 500MG IN 250ML IVPB (VIALMATE)
INTRAVENOUS | Status: DC
Start: 2020-06-15 — End: 2020-06-18
  Administered 2020-06-16 – 2020-06-17 (×3): 250.000 mL/h via INTRAVENOUS

## 2020-06-15 MED ORDER — WARFARIN 5 MG TABLET
5 mg | Freq: Once | ORAL | Status: CP
Start: 2020-06-15 — End: ?
  Administered 2020-06-16: 01:00:00 5 mg via ORAL

## 2020-06-15 MED ORDER — PREGABALIN 100 MG CAPSULE
100 mg | Freq: Two times a day (BID) | ORAL | Status: AC
Start: 2020-06-15 — End: 2021-12-28

## 2020-06-15 MED ORDER — SENNOSIDES 8.6 MG TABLET
8.6 mg | Freq: Every day | ORAL | Status: DC
Start: 2020-06-15 — End: 2020-06-20
  Administered 2020-06-16 – 2020-06-20 (×5): 8.6 mg via ORAL

## 2020-06-15 MED ORDER — IPRATROPIUM 0.5 MG-ALBUTEROL 3 MG (2.5 MG BASE)/3 ML NEBULIZATION SOLN
0.5 mg-3 mg(2.5 mg base)/3 mL | Freq: Once | RESPIRATORY_TRACT | Status: CP
Start: 2020-06-15 — End: ?
  Administered 2020-06-15: 18:00:00 0.5 mL via RESPIRATORY_TRACT

## 2020-06-15 MED ORDER — HYDROMORPHONE 4 MG TABLET
4 mg | Freq: Four times a day (QID) | ORAL | Status: DC | PRN
Start: 2020-06-15 — End: 2020-06-20
  Administered 2020-06-16 – 2020-06-20 (×14): 4 mg via ORAL

## 2020-06-15 MED ORDER — WARFARIN THERAPY PLACEHOLDER
ORAL | Status: DC
Start: 2020-06-15 — End: 2020-06-20

## 2020-06-15 MED ORDER — IPRATROPIUM 0.5 MG-ALBUTEROL 3 MG (2.5 MG BASE)/3 ML NEBULIZATION SOLN
0.5 mg-3 mg(2.5 mg base)/3 mL | Status: CP
Start: 2020-06-15 — End: ?
  Administered 2020-06-15: 19:00:00 0.5 mg-3 mg(2.5 mg base)/3 mL

## 2020-06-15 MED ORDER — METHYLPREDNISOLONE SOD SUCC (PF) 40 MG/ML SOLUTION FOR INJECTION
40 mg/mL | Freq: Three times a day (TID) | INTRAVENOUS | Status: DC
Start: 2020-06-15 — End: 2020-06-18
  Administered 2020-06-15 – 2020-06-18 (×8): 40 mL via INTRAVENOUS

## 2020-06-15 MED ORDER — CEFTRIAXONE IV PUSH 1000 MG VIAL & NS (ADULTS)
INTRAVENOUS | Status: DC
Start: 2020-06-15 — End: 2020-06-15

## 2020-06-15 NOTE — Utilization Review (ED)
UM Status: Meets Inpatient Status COPD, hypoxic, 84% RA, new oxygen needs, SOB. Neb and steroids orderedChristine Perlie Gold, RN [phone]

## 2020-06-15 NOTE — Plan of Care
Plan of Care Overview/ Patient Status    Admission Note Nursing [name] is a 69 y.o. male admitted with a chief complaint of SOB Patient arrived from home  Patient is alert & oriented x 4.   Vitals:  06/15/20 1714 06/15/20 1813 06/15/20 1814 06/15/20 1818 BP: 136/75  129/73  Pulse: (!) 100  (!) 100  Resp: (!) 22  16  Temp:   98 ?F (36.7 ?C)  TempSrc:   Oral  SpO2: 96% 99% 98%  Weight:    114 kg Height:    6' (1.829 m) Oxygen therapy Oxygen TherapySpO2: 98 %Device (Oxygen Therapy): high-flow nasal cannulaIs patient on wall or portable tank oxygen?: Connected to wall for periop/procedure/imagingOxygen Concentration (%): 70O2 Flow (L/min): 50$Oxygen On/Off : Therapy continuedI have reviewed the patient's current medication orders..Comments: Pt arrived to unit via stretcher w/ RN & RT. On HFNC 70%, 50L satting 98%. Good PO intake. Pt self catheterizes, page sent to MD for straight cath order. BM this am. BSC placed next to bed. Fall/ safety precautions maintained, bed alarm refused. Pt educated on increased risk for falls given assistive device, HFNC, etc. Pt verbalizes understanding. Pt also refusing to wear non-skid socks while OOB. Call bell is within reach. Pt educated on unit routine & call bell use. See flowsheets, patient education and plan of care for additional information.

## 2020-06-15 NOTE — Plan of Care
Plan of Care Overview/ Patient Status    Patient placed on high flow Nasal Cannula 50L 70%FiO2 to help with hypoxia sats 96-99%. No distress noted.

## 2020-06-15 NOTE — ED Notes
12:04 PM Care assumed and upon entry to room pt sitting in chair slumped over. Pt informed to place gown so he can be placed on monitor and IV can be established. 12:19 PMPt present w/ cc of SOB since the 4th of July. Stated he has pain all over and Pmh of COPD. Informed pt that he needs to be in the bed so I can carry out the task ordered but he states he rather sit in the chair. He then stated he feel like he wants to pass out and I told him the bed is where he needs to be. He refused to get in the bed after several attempts and told to let me know when he is ready. Charge nurse and provider will be informed. 12:45 PMPt ambulated to restroom w/ cane after having IV placed. Blood specimens collected and sent to lab. Pt seems to be working a little to breath. Pt now on monitor, call bell within reach and pending provider eval. 1:47 PMNeb tx started and medication given. Pt sitting on side of the bed and not laying in it due to not being able to breath when laying in the bed. 2:33 PMCovid swab collected and sent to lab. Admitting provider and ER provider at bedside. Pt sats dropping down due to him finally laying down. Providers aware. 2:38 PMVerbal order given to give another duo neb. 4:34 PMProvider wants pt to be placed on high flow NC. Informed that pt sats look better but he has only been sitting up due to not being able to breath laying down. RT paged. 5:17 PMFloor Handoff Telemetry: 	[x]  Yes		[]  NoCode Status:   [x]  Full		[]  DNR		[]  DNI		Other (specify):Safety Precautions: []  None	[]  Sitter   []  Restraints	[]  Suicidal	[x]  Fall Risk	Other (specify):Mentation/Orientation:	 A&O (Self, person, place, time) x    4      	 Disoriented to:                    	 Deficits: []  Hearing impaired	[]  Blind  []  Nonverbal	 []  Mental retardationOxygenation Upon Admission: []  RA	[]  NC	[]  Venti  []  Simple Mask [x]  Other- High flow NC	Baseline O2 Status? []  Yes	[]  NoAmbulation: []  Independent	[x]  Cane   []  Walker	[]  Wheelchair	[]  Bedbound		[]  Hemiplegic	[]  Paraplegic	[]  QuadraplegicEliminiation: [x]  Independent	[]  Commode	[]  Bedpan/Urinal  [x]  Straight Cath []  Foley cath			[]  Urostomy	[]  Colostomy	Other (specify):Diarrhea/Loose stool : []  1x within 24h  []  2x within 24h  []  3x within 24h  [x]  None 	C.Diff Order: 	[]  Ordered- needs to be collected             []  Collected-sent to lab             []  Resulted - Negative C.Diff             []  Resulted - Positive C.Diff[x]  Not Ordered   []  N/ASkin Alteration: []  Pressure Injury []  Wound [x]  None []  Skin not assessedDiet: []  Regular/No order placed	[]  NPO		Other (specify):IV Access: [x]  PIV   []  PICC    []  Port    [] Central line    []  A-line    Other (specify)IVF/GTT Running Upon ED Departure? [x]  No	    []  Yes (specify):Outstanding Meds/Treatments/Tests:Patient Belongings:Are the belongings documented?          [x]  No	    []  YesIs someone taking belongings home?   [x]  No     []  Yes  Who? (specify)  ED RN and Contact number/MHB #:    Romana Juniper, RN [phone]

## 2020-06-15 NOTE — Other
Pulmonary ConsultIvar GARNER [name] is a 69 y.o. year old man, a former smoker of 1 PPD x 51 years whom I know from prior hospitalizations and the office.Most recently there was an admission on 05/21/2020 with discharge on 05/27/2020.  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 Side effects and risks of long term prednisone use were discussed in detail. After he is off prednisone try Symbicort, 1 inhalation in the morning.If tolerated would use Symbicort 2 inhalations in the morning and 1 inhalation at night.If not tolerated, he might be a good candidate for a long-acting muscarinic antagonist.  We  will consider starting Spiriva. Continue warfarin dosing with Dr. Nedra Hai.Needs referral to a dietitian to learn about dietary restrictions warfarin.Follow-up in 1-1.5 months.He completed the prednisone taper.  He  returns now with terrible dyspnea.  All of his medications made him crazy.  He tried Symbicort once and had to run out of the house because of anxiety.  Nebulized medications do the same thing.  He remains terribly short of breath.When I visit him in the ED he asks me to sit him up because he feels like he is drowning. [name]  has a past medical history of Basal cell carcinoma, Blood transfusion, without reported diagnosis, BPH (benign prostatic hyperplasia), Concussion (04/2016), COPD (chronic obstructive pulmonary disease) (HC Code) (04/10/2015), Dupuytren's contracture of both hands, Fatty liver (10/14/2018), GERD (gastroesophageal reflux disease), Hematuria, History of gastric ulcer, Hypercholesteremia, Hypertension, Migraine, Neurogenic bladder, Osteoarthritis (09/19/2013), Renal colic, Respiratory arrest (HC Code) (04/2016), Sepsis (HC Code), Urinary problem, and UTI (urinary tract infection).[name]  has a past surgical history that includes anterior cervical fusion (03/2012); Mohs surgery; repair dupuytrens contracture (Bilateral); Arthroscopy shoulder w/ open rotator cuff repair (Right, 2012); Cervical spine surgery; and hand surgery (Right, 11/2019).[name]  reports that he quit smoking about 21 months ago. His smoking use included cigarettes. He smoked 0.25 packs per day. He has never used smokeless tobacco. He reports current alcohol use of about 1.0 - 2.0 standard drinks of alcohol per week. He reports that he does not use drugs.[name] family history includes Arthritis in his father and mother.[name] is allergic to hydrocodone; percocet [oxycodone-acetaminophen]; and duoneb [ipratropium-albuterol].No current facility-administered medications for this encounter. Current Outpatient Medications: ?  ascorbic acid, vitamin C, (VITAMIN C) 500 mg tablet, Take 500 mg by mouth 2 (two) times daily. , Disp: , Rfl: ?  baclofen (LIORESAL) 5 mg tablet, Take 5 mg by mouth 2 (two) times daily as needed., Disp: , Rfl: ?  cholecalciferol (VITAMIN D-3) 50 mcg (2,000 unit) capsule, Take 2,000 Units by mouth 2 (two) times daily. , Disp: , Rfl: ?  hydroCHLOROthiazide (HYDRODIURIL) 25 mg tablet, Take 1 tablet (25 mg total) by mouth daily., Disp: 30 tablet, Rfl: 0 ?  HYDROmorphone (DILAUDID) 4 mg tablet, Take 4 mg by mouth every 6 (six) hours as needed (Pain)., Disp: , Rfl: ?  melatonin 5 mg tablet, Take 5-10 mg by mouth nightly as needed (Sleep). , Disp: , Rfl: ?  pantoprazole (PROTONIX) 40 mg tablet, Take 1 tablet (40 mg total) by mouth daily., Disp: 90 tablet, Rfl: 2?  potassium 99 mg Tab,  Take 99 mg by mouth 2 (two) times daily. , Disp: , Rfl: ?  pregabalin (LYRICA) 100 mg capsule, Take 100 mg by mouth 2 (two) times daily., Disp: , Rfl: ?  senna (SENOKOT) 8.6 mg tablet, Take 1 tablet by mouth daily., Disp: , Rfl: ?  warfarin (COUMADIN) 5 mg tablet, Take 2 tablets (10 mg) tonight. Then take as directed by Dr. Nedra Hai following your blood test tomorrow., Disp: 30 tablet, Rfl: 0?  budesonide-formoteroL (SYMBICORT) 80-4.5 mcg/actuation HFA aerosol inhaler, Inhale 2 puffs into the lungs 2 (two) times daily. (Patient not taking: Reported on 06/03/2020), Disp: 10.2 g, Rfl: 0?  pregabalin (LYRICA) 50 mg capsule, Take 1 capsule (50 mg total) by mouth 2 (two) times daily. (Patient not taking: Reported on 06/15/2020), Disp: 60 capsule, Rfl: 2?  tadalafiL (CIALIS) 5 mg tablet, Take 1 tablet (5 mg total) by mouth daily. for BPH. (Patient not taking: Reported on 06/15/2020), Disp: 30 tablet, Rfl: 11Temp:  [98.1 ?F (36.7 ?C)] 98.1 ?F (36.7 ?C)Pulse:  [99-106] 106Resp:  [22-27] 22BP: (121-142)/(76-82) 142/82SpO2:  [84 %-95 %] 84 %Device (Oxygen Therapy): room airAlert and engaged.Mild prolongation of the expiratory phase.There are mid to end expiratory wheezeHeart is regularThere is 1+ pitting edemaRecent Labs   07/19/211239 NA 143 K 3.2* CL 102 CO2 34* BUN 14 CREATININE 0.93 GLU 182* Recent Labs   07/19/211239 WBC 7.6 HGB 13.1* PLT 271  Recent Labs   07/19/211239 INR 2.16* Results for orders placed or performed during the hospital encounter of 06/15/20 Blood gas, arterial Result Value Ref Range Site Left Radial   pH Arterial 7.52 (H) 7.35 - 7.45 units  pCO2, Arterial 37 35 - 45 mmHg  pO2, Arterial 47 (LL) 75 - 100 mmHg  Calculated HCO3, Arterial 30.3 (H) 20.0 - 26.0 mmol/L  Base Excess, Arterial 7 (H) -2 - 2 mmol/L  Patient Temperature 37.0 Celsius  O2 Sat, Arterial 83 (LL) 95 - 97 %  Carboxyhemoglobin, Arterial 1.2 0.5 - 1.5 %  Methemoglobin, Arterial 0.7 0.0 - 1.5 %  [facility] Room Air  BNP 131PFT 09/28/2020Vital capacity is normal.Flow rates are reduced relative to vital capacity.There is no significant response to inhaled bronchodilators at the time of the study.Fractional excretion of nitric oxide is low at seven parts per billion.Assessment:Mild obstructive airways dysfunction.Compared to the prior study of 12/27/2017 flow rates are perhaps slightly lower.Last echo 02/03/2020 * Technically very limited* Normal left ventricular size and systolic function. Moderate concentric left ventricular hypertrophy.  LVEF estimated by visual assessment was between 55-60%.  Normal diastolic function and filling pressures.* Normal right ventricular cavity size.  Mildly decreased right ventricular systolic function on contrast images.  Estimated right ventricular systolic pressure is 26 mmHg.  TAPSE 2.0 cm- low nl.* Aortic valve was not well visualized.* Normal mitral valve leaflets.  No mitral regurgitation.  No mitral stenosis.* Tricuspid valve was not well visualized.* Pulmonic valve was not well visualized.* All visible segments of the aorta are normal in size.* IVC diameter < 2.1 cm that collapses < 50% with a sniff suggests mildly increased RAP (5-10 mmHg, mean 8 mmHg).* No evidence of pericardial effusion.Cxr (portable)Result Date: 7/19/2021Limited study without evidence of active pleural or parenchymal disease. Reported and Signed by:  Geroge Baseman, MDXr Chest Pa Or ApResult Date: 05/21/2020 Chronic changes in the right lung base.  Reported and Signed by:  Antonieta Iba, MDXr Chest Pa And LateralResult Date: 6/27/2021Right costophrenic angle effusion versus thickening without change. Reported and Signed by:  Geroge Baseman, MDResults for orders placed  or performed during the hospital encounter of 06/15/20 CTA Chest (PE) w IV Contrast  Narrative  CLINICAL INDICATION: Past medical history of PE, COPD, shortness of breath requiring BiPAP.TECHNIQUE: [place] angiography of the chest was performed following the intravenous injection of 70 cc of Omnipaque 350 at a rate and volume to allow for vascular structure evaluation (utilizing the pulmonary embolus protocol). Axial images were reformatted in the sagittal and coronal planes. Dose reduction techniques were used including automated exposure control and adjustment of MA and/or KV according to patient size.COMPARISON: Chest [place] from 05/21/2020.FINDINGS:No axillary, mediastinal, or hilar lymphadenopathy is seen. There are several prominent, but not definitively enlarged, lymph nodes in these regions which are most likely inflammatory/reactive. The heart is borderline enlarged. Mild to moderate coronary artery and valvular calcifications are seen. Trace pericardial fluid is likely physiologic. Mild atherosclerotic calcifications are seen in the thoracic aorta. The ascending aorta is borderline in caliber measuring up to 3.4 cm in diameter, unchanged.Evaluation of the pulmonary arteries is mildly limited by respiratory motion artifact. No large central pulmonary embolus is detected. Evaluation of the segmental and subsegmental pulmonary arteries is limited; however, no definite filling defect is seen to suggest pulmonary embolus. The main pulmonary artery is normal in caliber. There is mild to moderate elevation of the right hemidiaphragm. There is a small right pleural effusion which has slightly increased in size. No left pleural effusion is identified.The trachea and central airways appear patent. Mild diffuse bronchiectatic changes appear stable. Moderate to severe emphysematous changes are most pronounced in the upper lobes. Again seen are surgical sutures in the right lower lobe. Ill-defined linear/nodular soft tissue opacity surrounding the vascular bundle in the right lower lobe, adjacent to the surgical sutures, appears similar to the prior study. This is likely related to nodular surgical scarring; however, neoplasm is not excluded. Mild dependent atelectasis is seen. No focal consolidation is identified. Subtle hazy groundglass opacities are seen inferomedially in the left upper lobe and anteromedially in the left lower lobe, which have increased since 05/21/2020. This is most likely infectious or inflammatory (7:45-69).Evaluation of the upper abdomen is limited due to only partial visualization and the early phase of contrast enhancement. There is probable hepatic steatosis. There is atrophy of the visualized pancreas. No adrenal mass is detected.Mild to moderate multilevel degenerative changes are seen in the visualized spine. Arthritic changes are also seen in the shoulders.  Impression  1. Evaluation is limited by patient motion; however, there is no [place] evidence of pulmonary embolus.2. Stable emphysematous changes.3. Postsurgical changes in the right lower lobe; ill-defined somewhat nodular densities adjacent to the surgical sutures were also seen previously and are most likely related to postsurgical scarring; however, residual or recurrent neoplasm is difficult to exclude. Attention on continued follow-up is advised. 4. Subtle hazy groundglass opacities in the left upper and left lower lobes have increased since 05/21/2020. These are most likely infectious or inflammatory. Attention on continued follow-up is advised.5. Additional chronic findings described above.Reported and Signed by:  Silvio Pate, MD Results for orders placed or performed during the hospital encounter of 07/26/19 [place] chest with IV contrast  Narrative  Examination: [place] CHEST W IV CONTRAST, 07/26/2019 7:45 AMClinical indication: Follow-up 1. Giant cell interstitial pneumonia following resection 2. Right lower lobe Pulmonary artery thrombosisTechnique: After the intravenous administration of 70 mL of Omnipaque 350 a postcontrast [place] of the chest was performed. Individual dose optimization technique was used for the performance of this procedure, and one or more of the following dose  reduction techniques was used: Automated exposure control and/or adjustment of the MA and/or kV according to patient size and/or the use of iterative reconstruction technique.Comparison: November 14, 2019Findings:    Chest:    The prior right lower lobe segmental embolus is not seen though study is not optimized for evaluation of pulmonary emboli. There is no central embolus.Centrilobular emphysema with upper lobe predominance is unchanged. There are expected postsurgical changes at the right base. There is some stable right hilar nodal prominence which is subcentimeter in short axis, series 7 image 60. Attention on follow-up is recommended. There are no suspicious pulmonary nodules.  The central airways are patent. No mediastinal adenopathy. Heart is normal in size. No axillary adenopathy.Limited evaluation of the upper abdomen is unremarkable. There are no suspicious osseous lesions.  Impression  Impression: 1.  Expected postsurgical change at the right base without evidence of local recurrence. Some right hilar nodal prominence is stable, possibly reactive, attention on follow-up recommended.2.  Prior right lower lobe segmental embolus is not seen. No central pulmonary embolus.3.  Extensive upper lobe predominant emphysemaReported and Signed by:  Lonna Cobb, MD Results for orders placed or performed during the hospital encounter of 09/17/18 [place] Chest wo IV Contrast  Narrative  [place] chest noncontrastCLINICAL INFORMATION: Follow-up nodule, is it vascular?, Super DCOMPARISON: 08/23/2018 and 07/05/2017.TECHNIQUE: Multislice spiral acquisition was performed using the Super D protocol and multiplanar reformatting. Individual dose optimization technique was used for the performance of this procedure, and one or more of the following dose reduction techniques was used: Automated exposure control and/or adjustment of the MA and/or kV according to patient size and/or the use of iterative reconstruction technique.FINDINGS: An oval nodule is seen at a bifurcation of a peripheral vessel in the superior segment of the right lower lobe, measuring currently about 8 mm in maximum AP diameter, unchanged from most recent prior study,, measured at about 7 mm 07/05/2017. The nodule is smooth, noncalcified, without irregular or spiculated margins.No other pulmonary nodule, mass or infiltrate is seen. Moderate emphysema is most pronounced in the upper lobes. No primary interstitial lung disease is seen. There is no pleural fluid or pneumothorax. The trachea and central airways are unremarkable. A few small lymph nodes in the middle mediastinum are unchanged. No mediastinal mass is identified. Scattered atherosclerotic calcification in the wall of the aorta is noted and a few scattered coronary arterial calcifications are noted. There is no pericardial fluid. No esophageal dilation or wall thickening is seen.Scans continued into the upper abdomen reveal no adrenal enlargement. The visualized liver and the spleen reveal no specific abnormalities. There are no other specific abnormalities or changes from previous examination identified.  Impression  IMPRESSION: A nodule in the superior segment of the right lower lobe reveals no significant change when compared with previous recent study, possibly increased 1 mm since prior study 07/05/2017.Reported and Signed by:  Hal Neer, MD ACOPDBronchospastic exacerbation.Could the orthopnea/PND reflect cardiac dysfunction? Could the new ground glass be flash pulmonary edema?Mild metabolic alkalosis on ABGRight?lower lobe thrombosis in the region of his surgery that propagated slightly and led to a change from Eliquis to warfarinGiant cell interstitial pneumonia was found when a lung nodule was biopsied.PSystemic steroidNebulized bronchodilatorsWarfarinSupplemental oxygen as neededEric Leibert7/19/2021 4:51 PMBeeper 3172, Extension 3172Cell [phone] for any questions

## 2020-06-15 NOTE — Other
PHARMACY-ASSISTED MEDICATION REPORTPharmacist review of the best possible medication history obtained by the pharmacy medication history technician has been performed.  I have updated the home medication list and identified the following information that may be relevant to this admission. Recommendations:  None  Notes:None Home medication list updated and reviewed. Medication history completed through interview with patient, review of patient provided medication list, and review of pharmacy records. Current Medications Medication Sig Note  ascorbic acid, vitamin C, (VITAMIN C) 500 mg tablet Take 500 mg by mouth 2 (two) times daily.    baclofen (LIORESAL) 5 mg tablet Take 5 mg by mouth 2 (two) times daily as needed.   cholecalciferol (VITAMIN D-3) 50 mcg (2,000 unit) capsule Take 2,000 Units by mouth 2 (two) times daily.    hydroCHLOROthiazide (HYDRODIURIL) 25 mg tablet Take 1 tablet (25 mg total) by mouth daily.   HYDROmorphone (DILAUDID) 4 mg tablet Take 4 mg by mouth every 6 (six) hours as needed (Pain).   melatonin 5 mg tablet Take 5-10 mg by mouth nightly as needed (Sleep).    pantoprazole (PROTONIX) 40 mg tablet Take 1 tablet (40 mg total) by mouth daily.   potassium 99 mg Tab Take 99 mg by mouth 2 (two) times daily.    pregabalin (LYRICA) 100 mg capsule Take 100 mg by mouth 2 (two) times daily.   senna (SENOKOT) 8.6 mg tablet Take 1 tablet by mouth daily.   warfarin (COUMADIN) 5 mg tablet Take 2 tablets (10 mg) tonight. Then take as directed by Dr. Nedra Hai following your blood test tomorrow. 06/15/2020: Pharm Tech (CM): Patient reports his current dose is 5mg  every evening.  Greggory Stallion Jody Silas, RPH4:10 PM7/19/2021Phone: Available via [employer] ([facility])
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
## 2020-06-16 ENCOUNTER — Encounter: Admit: 2020-06-16 | Payer: MEDICARE | Attending: Plastic Surgery within the Head & Neck | Primary: Family Medicine

## 2020-06-16 ENCOUNTER — Inpatient Hospital Stay: Admit: 2020-06-16 | Payer: PRIVATE HEALTH INSURANCE

## 2020-06-16 ENCOUNTER — Ambulatory Visit: Admit: 2020-06-16 | Payer: PRIVATE HEALTH INSURANCE | Primary: Family Medicine

## 2020-06-16 DIAGNOSIS — J9691 Respiratory failure, unspecified with hypoxia: Secondary | ICD-10-CM

## 2020-06-16 LAB — COMPREHENSIVE METABOLIC PANEL
BKR A/G RATIO: 0.8
BKR ALANINE AMINOTRANSFERASE (ALT): 65 U/L (ref 12–78)
BKR ALBUMIN: 2.9 g/dL — ABNORMAL LOW (ref 3.4–5.0)
BKR ALKALINE PHOSPHATASE: 155 U/L — ABNORMAL HIGH (ref 20–135)
BKR ANION GAP: 6 (ref 5–18)
BKR ASPARTATE AMINOTRANSFERASE (AST): 17 U/L (ref 5–37)
BKR AST/ALT RATIO: 0.3
BKR BILIRUBIN TOTAL: 0.3 mg/dL — ABNORMAL HIGH (ref 0.0–1.0)
BKR BLOOD UREA NITROGEN: 14 mg/dL (ref 8–25)
BKR BUN / CREAT RATIO: 19.4 x1000/??L (ref 8.0–25.0)
BKR CALCIUM: 9.2 mg/dL (ref 8.4–10.3)
BKR CHLORIDE: 105 mmol/L — ABNORMAL HIGH (ref 95–115)
BKR CREATININE: 0.72 mg/dL (ref 0.50–1.30)
BKR EGFR (AFR AMER): >60 mL/min/{1.73_m2} (ref >60)
BKR EGFR (NON AFRICAN AMERICAN): >60 mL/min/{1.73_m2} (ref >60)
BKR GLOBULIN: 3.6 g/dL
BKR GLUCOSE: 189 mg/dL — ABNORMAL HIGH (ref 70–100)
BKR OSMOLALITY CALCULATION: 289 mOsm/kg (ref 275–295)
BKR POTASSIUM: 3.4 mmol/L — ABNORMAL LOW (ref 3.5–5.1)
BKR PROTEIN TOTAL: 6.5 g/dL (ref 6.4–8.2)
BKR SODIUM: 142 mmol/L (ref 136–145)
BKR WAM MPV: 6.5 g/dL (ref 6.4–8.2)

## 2020-06-16 LAB — RESPIRATORY VIRUS PCR PANEL     (BH GH LMW Q YH)
BKR ADENOVIRUS: NOT DETECTED
BKR HUMAN METAPNEUMOVIRUS (HMPV): NOT DETECTED
BKR INFLUENZA A H1: NOT DETECTED
BKR INFLUENZA A H3: NOT DETECTED
BKR INFLUENZA A: NOT DETECTED pg (ref 25.7–31.0)
BKR INFLUENZA B: NOT DETECTED
BKR PARAINFLUENZA VIRUS 1: NOT DETECTED
BKR PARAINFLUENZA VIRUS 2: NOT DETECTED
BKR PARAINFLUENZA VIRUS 3: NOT DETECTED
BKR PARAINFLUENZA VIRUS 4: NOT DETECTED
BKR RESPIRATORY SYNCYTIAL VIRUS A: NOT DETECTED
BKR RESPIRATORY SYNCYTIAL VIRUS B: NOT DETECTED
BKR RHINOVIRUS: NOT DETECTED

## 2020-06-16 LAB — PROTIME AND INR
BKR INR: 2.07 — ABNORMAL HIGH (ref 0.88–1.14)
BKR PROTHROMBIN TIME: 21.3 seconds — ABNORMAL HIGH (ref 9.4–12.0)

## 2020-06-16 LAB — CBC WITH AUTO DIFFERENTIAL
BKR CO2: 4.3 M/??L — ABNORMAL LOW (ref 4.6–6.1)
BKR WAM ABSOLUTE IMMATURE GRANULOCYTES: 0.1 x 1000/??L (ref 0.0–0.2)
BKR WAM ABSOLUTE LYMPHOCYTE COUNT: 1 x 1000/??L (ref 1.0–2.3)
BKR WAM ABSOLUTE NRBC: 0 x 1000/??L (ref <=0.0)
BKR WAM ANALYZER ANC: 9.9 x 1000/ÂµL — ABNORMAL HIGH (ref 1.8–7.3)
BKR WAM BASOPHIL ABSOLUTE COUNT: 0 x 1000/??L (ref 0.0–0.6)
BKR WAM BASOPHILS: 0.1 % (ref 0.0–2.0)
BKR WAM EOSINOPHIL ABSOLUTE COUNT: 0 x 1000/??L (ref 0.0–0.4)
BKR WAM EOSINOPHILS: 0 % (ref 0.0–6.0)
BKR WAM HEMATOCRIT: 39.9 % — ABNORMAL LOW (ref 41.0–54.0)
BKR WAM HEMOGLOBIN: 13 g/dL — ABNORMAL LOW (ref 13.7–18.0)
BKR WAM IMMATURE GRANULOCYTES: 0.4 % (ref 0.0–2.0)
BKR WAM LYMPHOCYTES: 8.7 % — ABNORMAL LOW (ref 14.0–43.0)
BKR WAM MCH (PG): 30.2 pg (ref 25.7–31.0)
BKR WAM MCHC: 32.6 g/dL (ref 32.0–36.0)
BKR WAM MCV: 92.6 fL (ref 80.0–99.0)
BKR WAM MONOCYTE ABSOLUTE COUNT: 0.2 x 1000/??L — ABNORMAL LOW (ref 0.4–1.3)
BKR WAM MONOCYTES: 1.9 % (ref 0.0–14.0)
BKR WAM NEUTROPHILS: 88.9 % — ABNORMAL HIGH (ref 38.0–74.0)
BKR WAM NUCLEATED RED BLOOD CELLS: 0 % (ref 0.0–0.2)
BKR WAM PLATELETS: 305 x1000/ÂµL (ref 140–446)
BKR WAM RDW-CV: 13.8 % (ref 11.5–14.5)
BKR WAM RED BLOOD CELL COUNT: 4.3 M/ÂµL — ABNORMAL LOW (ref 4.6–6.1)
BKR WAM WHITE BLOOD CELL COUNT: 11.2 x1000/??L — ABNORMAL HIGH (ref 3.8–10.6)

## 2020-06-16 LAB — PROCALCITONIN     (BH GH LMW Q YH): BKR PROCALCITONIN: <0.04 ng/mL

## 2020-06-16 LAB — C-REACTIVE PROTEIN     (CRP): BKR C-REACTIVE PROTEIN: 7.9 mg/dL — ABNORMAL HIGH (ref 0.0–1.0)

## 2020-06-16 MED ORDER — FAMOTIDINE 20 MG TABLET
20 mg | Freq: Two times a day (BID) | ORAL | Status: DC
Start: 2020-06-16 — End: 2020-06-20
  Administered 2020-06-17 – 2020-06-20 (×8): 20 mg via ORAL

## 2020-06-16 MED ORDER — WARFARIN 5 MG TABLET
5 mg | Freq: Once | ORAL | Status: CP
Start: 2020-06-16 — End: ?
  Administered 2020-06-16: 22:00:00 5 mg via ORAL

## 2020-06-16 NOTE — Progress Notes
Pulmonary Follow-upFeels better.Breathing better.He does NOT currently have the paranoid feeling that he gets from all MDI's and which will prevent him from using them.Current Facility-Administered Medications: ?  acetaminophen (TYLENOL) tablet 650 mg, 650 mg, Oral, Q6H PRN, Alvis Lemmings, MD?  azithromycin (ZITHROMAX) 500 mg in sodium chloride 0.9% 250 mL IVPB (vialmate), 500 mg, Intravenous, Q24H, Alvis Lemmings, MD, Last Rate: 250 mL/hr at 06/15/20 2149, 500 mg at 06/15/20 2149?  budesonide-formoteroL (SYMBICORT) 160-4.5 mcg/actuation HFA aerosol inhaler 2 puff, 2 puff, Inhalation, 2 times daily, Alvis Lemmings, MD?  cefTRIAXone (ROCEPHIN) 1 g in sodium chloride 0.9% PF 10 mL (100 mg/mL), 1 g, IV Push, Q24H, Alvis Lemmings, MD, 1 g at 06/15/20 2127?  HYDROmorphone (DILAUDID) tablet 4 mg, 4 mg, Oral, Q6H PRN, Alvis Lemmings, MD?  ipratropium-albuteroL (DUO-NEB) 0.5 mg-3 mg(2.5 mg base)/3 mL nebulizer solution 3 mL, 3 mL, Nebulization, Q6H WA, Alvis Lemmings, MD, 3 mL at 06/16/20 [id number]?  methylPREDNISolone PF (Solu-MEDROL PF) injection 40 mg, 40 mg, IV Push, Q8H, Alvis Lemmings, MD, 40 mg at 06/16/20 0230?  pantoprazole (PROTONIX) EC tablet 40 mg, 40 mg, Oral, Q12H, Alvis Lemmings, MD, 40 mg at 06/16/20 0849?  Potassium chloride SA (K-DUR,KLOR-CON M) 24 hr tablet 10 mEq, 10 mEq, Oral, Daily, Alvis Lemmings, MD, 10 mEq at 06/16/20 0849?  pregabalin (LYRICA) capsule 100 mg, 100 mg, Oral, BID, Alvis Lemmings, MD, 100 mg at 06/16/20 0849?  senna (SENOKOT) tablet 8.6 mg, 1 tablet, Oral, Daily, Alvis Lemmings, MD, 8.6 mg at 06/16/20 0849?  sodium chloride 0.9 % flush 3 mL, 3 mL, IV Push, Q8H, Alvis Lemmings, MD, 3 mL at 06/15/20 2123?  sodium chloride 0.9 % flush 3 mL, 3 mL, IV Push, PRN for Line Care, Alvis Lemmings, MD?  Warfarin Therapy Placeholder, , Oral, placeholder, Alvis Lemmings, MDBP 131/81  - Pulse (!) 93  - Temp 97.5 ?F (36.4 ?C) (Oral)  - Resp 18  - Ht 6' (1.829 m)  - Wt 114 kg  - SpO2 96%  - BMI 34.09 kg/m? On high flow F=70, flow =50 liters, SpO2 100%Alert and engaged.Mild prolongation of the expiratory phase.Scattered wheezes.No rales or rhonchiHeart is regularTrace edema.Recent Labs   07/19/211239 07/20/210533 NA 143 142 K 3.2* 3.4* CL 102 105 CO2 34* 31 BUN 14 14 CREATININE 0.93 0.72 GLU 182* 189* Recent Labs   07/19/211239 07/20/210533 WBC 7.6 11.2* HGB 13.1* 13.0* PLT 271 305  Recent Labs   07/19/211239 07/20/210533 INR 2.16* 2.07* ACOPDBronchospastic exacerbation.Paranoia on MDI's. Is this just beta agonism and tachycardia/ tremor. Hypoxia and resultant right heart strain can cause an anxious feeling, and he gets better with O2 at hospitalization.Could the orthopnea/PND reflect cardiac dysfunction? Could the new ground glass be flash pulmonary edema?I doubt that the new ground glass is bacterial pneumonia.Mild metabolic alkalosis on ABGRight?lower lobe thrombosis in the region of his surgery?that propagated slightly and led to a change from Eliquis to warfarin. This is better on the current [place].Giant cell interstitial pneumonia was found when a lung nodule was biopsied.?PSystemic steroidNebulized bronchodilatorsWarfarinSupplemental oxygen as neededI doubt that the new ground glass is bacterial pneumonia and would give abx only until cx negative. (And if truly bacterial he should be covered for hospital organisms.)Shontelle Muska Leibert7/20/2021 9:21 AMBeeper [phone], Extension 3172Cell [phone] for any questions

## 2020-06-16 NOTE — Progress Notes
[facility] Medicine Progress NoteAttending Provider: Jannette Fogo, MD Subjective                                                                              Subjective: Interim History: pt feels a little better this am  he ate his breakfast, no nausea or vomiting. However he does note his abdomen is very distendedHas had norma BMsReview of Allergies/Meds/Hx: Review of Allergies/Meds/Hx:I have reviewed the patient's: allergies, current scheduled medications, current infusions, current prn medications and past medical history Objective Objective: Vitals:Last 24 hours: Temp:  [97.5 ?F (36.4 ?C)-98.3 ?F (36.8 ?C)] 97.8 ?F (36.6 ?C)Pulse:  [86-106] 95Resp:  [16-27] 16BP: (96-142)/(57-82) 122/66SpO2:  [84 %-99 %] 94 % and O2 Sat and Device used: SpO2: 94 %Device (Oxygen Therapy): high-flow nasal cannulaI/O's:Gross Totals (Last 24 hours) at 06/16/2020 1158Last data filed at 06/16/2020 1125Intake 360 ml Output 330 ml Net 30 ml Procedures:None Physical Exam  obese gentleman High flow oxygen in placeAwake and alerts1s2 regularlungs scattered exp wheezing and generally reduced air flowAbdomen distended , non tender, BS presentextrem trace edemaLabs:Last 24 hours: Recent Results (from the past 24 hour(s)) EKG  Collection Time: 06/15/20 12:23 PM Result Value Ref Range  Heart Rate 97 bpm  QRS Duration 90 ms  Q-T Interval 346 ms  QTC Calculation(Bezet) 439 ms  P Axis 29 deg  R Axis -5 deg  T Axis 31 deg  P-R Interval 156 msec  ECG - SEVERITY Abnormal ECG severity Protime-INR  Collection Time: 06/15/20 12:39 PM Result Value Ref Range  Prothrombin Time 22.2 (H) 9.4 - 12.0 seconds  INR 2.16 (H) 0.88 - 1.14 Blood culture  Collection Time: 06/15/20 12:39 PM  Specimen: Peripheral; Blood Result Value Ref Range  Blood Culture No Growth to Date  Lactic acid, plasma (reflex 2h repeat) Collection Time: 06/15/20 12:39 PM Result Value Ref Range  Lactate 3.8 (H) 0.4 - 2.0 mmol/L NT-proBrain natriuretic peptide  Collection Time: 06/15/20 12:39 PM Result Value Ref Range  B-Type Natriuretic Peptide, Pro (proBNP) 136.0 0.0 - 900.0 pg/mL CBC auto differential  Collection Time: 06/15/20 12:39 PM Result Value Ref Range  WBC 7.6 3.8 - 10.6 x1000/?L  RBC 4.4 (L) 4.6 - 6.1 M/?L  Hemoglobin 13.1 (L) 13.7 - 18.0 g/dL  Hematocrit 16.1 09.6 - 54.0 %  MCV 94.7 80.0 - 99.0 fL  MCHC 31.7 (L) 32.0 - 36.0 g/dL  RDW-CV 04.5 40.9 - 81.1 %  Platelets 271 140 - 446 x1000/?L  MPV 9.9 9.8 - 12.3 fL  ANC (Abs Neutrophil Count) 5.6 1.8 - 7.3 x 1000/?L  Neutrophils 74.5 (H) 38.0 - 74.0 %  Lymphocytes 16.0 14.0 - 43.0 %  Absolute Lymphocyte Count 1.2 1.0 - 2.3 x 1000/?L  Monocytes 5.3 0.0 - 14.0 %  Monocyte Absolute Count 0.4 0.4 - 1.3 x 1000/?L  Eosinophils 3.6 0.0 - 6.0 %  Eosinophil Absolute Count 0.3 0.0 - 0.4 x 1000/?L  Basophil 0.3 0.0 - 2.0 %  Basophil Absolute Count 0.0 0.0 - 0.6 x 1000/?L  Immature Granulocytes 0.3 0.0 - 2.0 %  Absolute Immature Granulocyte Count 0.0 0.0 - 0.2 x 1000/?L  nRBC 0.0 0.0 - 0.2 %  Absolute nRBC 0.0 <=0.0 x 1000/?L  MCH 30.0 25.7 - 31.0 pg Cardiac profile (Troponin I reflex CK/CKMB)  Collection Time: 06/15/20 12:39 PM Result Value Ref Range  Troponin I <0.015 0.000 - 1.500 ng/mL Comprehensive metabolic panel  Collection Time: 06/15/20 12:39 PM Result Value Ref Range  Sodium 143 136 - 145 mmol/L  Potassium 3.2 (L) 3.5 - 5.1 mmol/L  Chloride 102 95 - 115 mmol/L  CO2 34 (H) 21 - 32 mmol/L  Anion Gap 7 5 - 18  Glucose 182 (H) 70 - 100 mg/dL  BUN 14 8 - 25 mg/dL  Creatinine 0.45 4.09 - 1.30 mg/dL  Calcium 9.7 8.4 - 81.1 mg/dL  BUN/Creatinine Ratio 91.4 8.0 - 25.0  Total Protein 6.9 6.4 - 8.2 g/dL  Albumin 3.3 (L) 3.4 - 5.0 g/dL  Total Bilirubin 0.4 0.0 - 1.0 mg/dL  Alkaline Phosphatase 782 (H) 20 - 135 U/L  Alanine Aminotransferase (ALT) 84 (H) 12 - 78 U/L  Aspartate Aminotransferase (AST) 41 (H) 5 - 37 U/L  Globulin 3.6 g/dL  A/G Ratio 0.9   AST/ALT Ratio 0.5 See Comment  eGFR (Afr Amer) >60 >60 mL/min/1.30m2  eGFR (NON African-American) >60 >60 mL/min/1.34m2  Osmolality Calculation 290 275 - 295 mOsm/kg Procalcitonin     (BH GH LMW Q YH)  Collection Time: 06/15/20 12:39 PM Result Value Ref Range  Procalcitonin 0.09 See Comment ng/mL NT-proBrain natriuretic peptide  Collection Time: 06/15/20 12:39 PM Result Value Ref Range  B-Type Natriuretic Peptide, Pro (proBNP) 131.0 0.0 - 900.0 pg/mL Blood culture  Collection Time: 06/15/20  1:48 PM  Specimen: Peripheral; Blood Result Value Ref Range  Blood Culture No Growth to Date  UA with culture reflex  Collection Time: 06/15/20  1:52 PM  Specimen: Urine Result Value Ref Range  Clarity, UA Clear Clear  Color, UA Yellow Yellow  Specific Gravity, UA 1.015 1.005 - 1.030  pH, UA 7.0 5.5 - 7.5  Protein, UA Negative Negative-Trace  Glucose, UA Negative Negative  Ketones, UA Negative Negative  Blood, UA Negative Negative  Bilirubin, UA Negative Negative  Leukocytes, UA Negative Negative  Nitrite, UA Negative Negative  Urobilinogen, UA <2.0 <=2.0 EU/dL SARS CoV-2 (NFAOZ-30) RNA - [facility] LMW YH)  Collection Time: 06/15/20  2:32 PM  Specimen: Nasopharynx; Viral Result Value Ref Range  SARS-CoV-2 RNA (COVID-19) Negative Negative Blood gas, arterial  Collection Time: 06/15/20  3:07 PM Result Value Ref Range  Site Left Radial   pH Arterial 7.52 (H) 7.35 - 7.45 units  pCO2, Arterial 37 35 - 45 mmHg  pO2, Arterial 47 (LL) 75 - 100 mmHg  Calculated HCO3, Arterial 30.3 (H) 20.0 - 26.0 mmol/L  Base Excess, Arterial 7 (H) -2 - 2 mmol/L  Patient Temperature 37.0 Celsius  O2 Sat, Arterial 83 (LL) 95 - 97 %  Carboxyhemoglobin, Arterial 1.2 0.5 - 1.5 % Methemoglobin, Arterial 0.7 0.0 - 1.5 %  Appliance Room Air  EKG  Collection Time: 06/15/20  3:37 PM Result Value Ref Range  Heart Rate 101 bpm  QRS Duration 92 ms  Q-T Interval 342 ms  QTC Calculation(Bezet) 443 ms  P Axis 40 deg  R Axis 3 deg  T Axis 41 deg  P-R Interval 160 msec  ECG - SEVERITY Abnormal ECG severity Lactic acid, plasma  Collection Time: 06/15/20  3:41 PM Result Value Ref Range  Lactate 1.5 0.4 - 2.0 mmol/L Cardiac profile (Troponin I reflex CK/CKMB)  Collection Time: 06/15/20  3:42 PM Result Value Ref Range  Troponin I <0.015 0.000 -  1.500 ng/mL Protime & INR  Collection Time: 06/16/20  5:33 AM Result Value Ref Range  Prothrombin Time 21.3 (H) 9.4 - 12.0 seconds  INR 2.07 (H) 0.88 - 1.14 C-reactive protein  Collection Time: 06/16/20  5:33 AM Result Value Ref Range  C-Reactive Protein 7.9 (H) 0.0 - 1.0 mg/dL Comprehensive metabolic panel  Collection Time: 06/16/20  5:33 AM Result Value Ref Range  Sodium 142 136 - 145 mmol/L  Potassium 3.4 (L) 3.5 - 5.1 mmol/L  Chloride 105 95 - 115 mmol/L  CO2 31 21 - 32 mmol/L  Anion Gap 6 5 - 18  Glucose 189 (H) 70 - 100 mg/dL  BUN 14 8 - 25 mg/dL  Creatinine 9.62 9.52 - 1.30 mg/dL  Calcium 9.2 8.4 - 84.1 mg/dL  BUN/Creatinine Ratio 32.4 8.0 - 25.0  Total Protein 6.5 6.4 - 8.2 g/dL  Albumin 2.9 (L) 3.4 - 5.0 g/dL  Total Bilirubin 0.3 0.0 - 1.0 mg/dL  Alkaline Phosphatase 401 (H) 20 - 135 U/L  Alanine Aminotransferase (ALT) 65 12 - 78 U/L  Aspartate Aminotransferase (AST) 17 5 - 37 U/L  Globulin 3.6 g/dL  A/G Ratio 0.8   AST/ALT Ratio 0.3 See Comment  eGFR (Afr Amer) >60 >60 mL/min/1.65m2  eGFR (NON African-American) >60 >60 mL/min/1.34m2  Osmolality Calculation 289 275 - 295 mOsm/kg CBC auto differential  Collection Time: 06/16/20  5:33 AM Result Value Ref Range  WBC 11.2 (H) 3.8 - 10.6 x1000/?L  RBC 4.3 (L) 4.6 - 6.1 M/?L  Hemoglobin 13.0 (L) 13.7 - 18.0 g/dL  Hematocrit 02.7 (L) 25.3 - 54.0 %  MCV 92.6 80.0 - 99.0 fL  MCHC 32.6 32.0 - 36.0 g/dL  RDW-CV 66.4 40.3 - 47.4 %  Platelets 305 140 - 446 x1000/?L  MPV 10.1 9.8 - 12.3 fL  ANC (Abs Neutrophil Count) 9.9 (H) 1.8 - 7.3 x 1000/?L  Neutrophils 88.9 (H) 38.0 - 74.0 %  Lymphocytes 8.7 (L) 14.0 - 43.0 %  Absolute Lymphocyte Count 1.0 1.0 - 2.3 x 1000/?L  Monocytes 1.9 0.0 - 14.0 %  Monocyte Absolute Count 0.2 (L) 0.4 - 1.3 x 1000/?L  Eosinophils 0.0 0.0 - 6.0 %  Eosinophil Absolute Count 0.0 0.0 - 0.4 x 1000/?L  Basophil 0.1 0.0 - 2.0 %  Basophil Absolute Count 0.0 0.0 - 0.6 x 1000/?L  Immature Granulocytes 0.4 0.0 - 2.0 %  Absolute Immature Granulocyte Count 0.1 0.0 - 0.2 x 1000/?L  nRBC 0.0 0.0 - 0.2 %  Absolute nRBC 0.0 <=0.0 x 1000/?L  MCH 30.2 25.7 - 31.0 pg Procalcitonin     (BH GH LMW Q YH)  Collection Time: 06/16/20  5:33 AM Result Value Ref Range  Procalcitonin <0.04 See Comment ng/mL Diagnostics:xray abdomen ECG/Tele Events: No ECG ordered today Assessment Assessment: Assessment:COPD with bronchospasm? Pneumonia Right lower lobe thrombosis , improved on this [place] scan , on warfarin with therapeutic INR distended abdomen / no sxs of SBO , eating and passing BMs, no nausea or vomiting  negative BNP and procalcitonin.Chronic narcotic pain medsobesityI have reviewed the patient's problem list and updated it as needed. Plan Plan: continue steroids  continue  abx through today but if cxs negative will dc tomorrowNebulized bronchodilatorsSupplemental oxygen repeat resp viral panel ( neg on last admission)OOB to chair continue coumadin  bladder scan( hx of urinary retention) Electronically Signed:Leeanne Deed, MD Beeper 12547/20/2021, 11:56 AM

## 2020-06-16 NOTE — Plan of Care
Plan of Care Overview/ Patient Status    Pt rec'd on high flow nc 70% at 50 LPM.  Fio2 titrated to 55% and sats throughout the day have been 92-95%.

## 2020-06-16 NOTE — Plan of Care
Plan of Care Overview/ Patient Status    Pt received and maintained on HFNC 70% and flow 50.SpO2 95-97%.

## 2020-06-16 NOTE — Plan of Care
Problem: Adult Inpatient Plan of CareGoal: Plan of Care Review7/19/2021 2353 by Polo Riley, RNOutcome: Interventions implemented as appropriate7/19/2021 2353 by Polo Riley, RNOutcome: Interventions implemented as appropriateGoal: Patient-Specific Goal (Individualized)Outcome: Interventions implemented as appropriateGoal: Absence of Hospital-Acquired Illness or InjuryOutcome: Interventions implemented as appropriateGoal: Optimal Comfort and WellbeingOutcome: Interventions implemented as appropriateGoal: Readiness for Transition of CareOutcome: Interventions implemented as appropriate Problem: InfectionGoal: Infection Symptom ResolutionOutcome: Interventions implemented as appropriate Problem: Fall Injury RiskGoal: Absence of Fall and Fall-Related InjuryOutcome: Interventions implemented as appropriate Plan of Care Overview/ Patient Status    BP 116/70  - Pulse (!) 100  - Temp 98.1 ?F (36.7 ?C) (Oral)  - Resp 18  - Ht 6' (1.829 m)  - Wt 114 kg  - SpO2 95%  - BMI 34.09 kg/m? Report from RN Marisue Ivan received at 2130. Pt is A&Ox4, VSS, RTM in place, NSR in 90s. Pt maintaining O2 sats in high 90s on HFNC 50 L 70% FiO2. Lungs diminished bilaterally, no sob/doe/cp. Pt refusing symbicort inhaler, claims it makes him go crazy, Dr Berkley Harvey aware. ABD rouded, soft, bowel sounds present in 4 quadrants. Pt denies n/v, tolerating cardiac diet at this time. Pt self catheterizes without issue. Pedal pulses present bilaterally, denies tingling/numbness in all extremities, denies calf pain bilaterally. PIV to Left forearm in tact, no s/s site complications. Denies pain at this time, resting comfortably. Bed alarm activated, frequent rounding, call bell in reach, will continue to monitor as shift progresses.

## 2020-06-16 NOTE — Plan of Care
Plan of Care Overview/ Patient Status    Pt A+O x4. Moves independently in bed. OOB w/ standby assist, steady on feet. C/o chronic back pain today, hydromorphone adminstered w/ good effect. Also received tylenol for HA. Pt refusing certain meds/aspects of care. NSR on tele. VSS. Afebrile. HFNC titrated per flowsheets. Sats maintained >92%. Denies SOB. Up in halls on NRB mask.  Pt +DOE and HR to 110's. Sats 97% s/p ambulation. Abdomen distended. No BM today but passing flatus. Pt self cath 350 cc this afternoon. PVR 0cc on bladder scan. No further concerns, see flowsheets and provider notes for more information.

## 2020-06-16 NOTE — Plan of Care
Plan of Care Overview/ Patient Status    1900-2100: pt presented sleeping, verbally responsive when woken up and fell back to sleep, on HFNC FiO2 70%, 50L ,  appeared comfortable, vss, oxygen sat=97%, telemetry ST 100's, multiple scratches and small scabbed areas noted on bilateral LE's,  report given to Helmut Muster RN and care assumed by her at 2100

## 2020-06-16 NOTE — Plan of Care
Plan of Care Overview/ Patient Status    Problem: Physical Therapy GoalsGoal: Physical Therapy GoalsDescription: PT GOALS1. Patient will perform bed mobility independently2. Patient will perform transfers with least assistive device with modified independence3. Patient will ambulate a minimum of 150 feet with least assistive device with modified independence4. Patient will ascend and descend at least 5 steps with modified independenceOutcome: Interventions implemented as appropriate Inpatient Physical Therapy EvaluationHPI/Precautions - 06/16/20 1135    Date of Visit / Treatment  Date of Visit / Treatment  06/16/20   Note Type  Evaluation   Start Time  1135    General Information  Pertinent History Of Current Problem  COPD exac   Subjective  I would like to go for a walk    General Observations  Pt resting in bed , high flow intact and pt agreeable to PT   Precautions/Limitations  fall precautions;bed alarm;chair alarm;respiratory    Social History - 06/16/20 1340    Prior Level of Functioning/Social History  Prior Level of Function  independent with mobility   Patient resides with:  Alone   Type of Home  Apartment   Home Setup  Elevator   Equipment Utilized Prior to Admission/Treatment  No device   Additional Comments  Pt lives alone in an apt. Pt was Independent with mobility without AD . + Driving     Range of Motion - 06/16/20 1341    Range of Motion  Range of Motion Examination  bilateral lower extremity ROM was [facility]   MMT/Musculo - 06/16/20 1341    Musculoskeletal  LLE Muscle Strength Grading  4-->active movement against gravity and resistance   RLE Muscle Strength Grading  4-->active movement against gravity and resistance      Balance - 06/16/20 1341    Balance  Sitting Balance: Static   GOOD     Maintains static position against moderate resistance with no Assistive Device   Sitting Balance: Dynamic   GOOD-   Independent in dynamic balance activities (ambulates on level surfaces with no Assistive Device, picks up object from floor in sitting)   Standing Balance: Static  FAIR+     Maintains static position without assist or device, may require Supervision or Verbal Cues (>2 minutes)   Standing Balance: Dynamic   FAIR      Performs dynamic activities through 75% range Contact Guard or partial range (50-75%) with Supervision   Balance Assist Device  No device     Mobility - 06/16/20 1341    Bed Mobility  Rolling/Turning Right - Independence/Assistance Level  Modified independence   Rolling/Turning Left - Independence/Assistance Level  Modified independence   Rolling/Turning Assist Device  Bed rails;Head of bed elevated   Supine-to-Sit Independence/Assistance Level  Modified independence   Supine-to-Sit Assist Device  Bed rails;Head of bed elevated   Sit-to-Supine Independence/Assistance Level  Modified independence   Sit-to-Supine Assist Device  Bed rails;Head of bed elevated   Bed Mobility, Impairments  strength decreased   Bed mobility was limited by:  impaired motor function    Sit-Stand Transfer Training  Sit-to-Stand Transfer Independence/Assistance Level  Supervision;Verbal cues   Sit-to-Stand Transfer Assist Device  No device   Stand-to-Sit Transfer Independence/Assistance Level  Supervision;Verbal cues   Stand-to-Sit Transfer Assist Device  No device   Transfer Safety Analysis Concerns  decreased balance during turns   Transfer Safety Analysis Impairments  impaired balance    Gait Training  Independence/Assistance Level   Supervision   Assistive Device   No device  Gait Distance  100 feet   Gait Pattern Analysis  step through gait   Gait Analysis Deviations  decreased cadence;decreased step length   Gait Analysis Impairments  impaired balance   Ambulation distance was limited by:  impaired motor function   Gait Training Comments  Pt amb 18ft with Sup with high flow intact   PT Handoff - 06/16/20 1343    Handoff Documentation  Handoff  Patient in bed;Bed alarm;Patient instructed to call nursing for mobility;Discussed with nursing   AMPAC Basic Mobility - 06/16/20 1343    AM-PAC - Basic Mobility Screen- How much help from another person do you currently need.....  Turning from your back to your side while in a a flat bed without using rails?  4 - None - Does not require any help and does the activity independently. Can use assistive devices.   Moving from lying on your back to sitting on the side of a flat bed without using bed rails?  4 - None - Does not require any help and does the activity independently. Can use assistive devices.   Moving to and from a bed to a chair (including a wheelchair)?  3 - A Little - Requires a little help (supervision, minimal assistance). Can use assistive devices.   Standing up from a chair using your arms(e.g., wheelchair or bedside chair)?  3 - A Little - Requires a little help (supervision, minimal assistance). Can use assistive devices.   To walk in a hospital room?  3 - A Little - Requires a little help (supervision, minimal assistance). Can use assistive devices.   Climbing 3-5 steps with a railing?  3 - A Little - Requires a little help (supervision, minimal assistance). Can use assistive devices.   AMPAC Mobility Score  20   Highest Level of Mobility TARGET  Mobility Level 6,Walk 10+steps          Clinical Impression - 06/16/20 1343    Clinical Impression  Rehab Diagnosis  Impaired mobility   Initial Assessment  69 years old male admitted to the hospital due to COPD exac . Pt was educated on Role of PT,POC, d/c plan and safety . Nurse and RT present to connect high flow with portable . Pt amb 15ft with Sup with high flow intact and he was encouraged to increase mobility with nursing staff . Pt to continue with PT to improve endurance , mobility and safety   Criteria for Skilled Therapeutic Interventions Met  yes;treatment indicated   Pathology/Pathophysiology Noted (Describe Specifically for Each System)  musculoskeletal   Impairments Found (describe specific impairments)  aerobic capacity/endurance;locomotion;mobility;gait;muscle performance;muscle strength;balance   Functional Limitations in Following Categories (Describe Specific Limitations)  self-care;mobility   Disability: Inability to Perform Actions/Activities of Required Roles (describe specific disability)  community/leisure   Rehab Potential  good, to achieve stated therapy goals   Patient/Family Stated Goals - 06/16/20 1352    Patient/Family Stated Goals  Patient/Family Stated Goal(s)  return home;return to prior level of functioning;get stronger   Frequency/Equipment Recommendations - 06/16/20 1352    Frequency/Equipment Recommendations  PT Frequency  4x per week   Equipment Needs During Admission/Treatment  No device   Recommendations for IP Admission - 06/16/20 1353    PT Recommendations for Inpatient Admission  Activity/Level of Assist  out of bed;ambulate;assist of 1;in hall   ADL Recommendations  assist to bathroom;assist of 1;no device   Therapeutic Exercise  encourage exercise program issued   Other/Comments  amb 2-3 x / day  Planned Treatment/Interventions - 06/16/20 1353    Planned Treatment / Interventions  General Treatment / Interventions  Discharge Planning;Therapeutic Exercise;Other General Interventions;Aerobic Capacity / Endurance Conditioning   Training Treatment / Interventions  Functional Mobility Training;Balance / Investment banker, operational;Endurance Nurse, children's / Interventions  Patient Education / Training   Discharge Summary - 06/16/20 1353    PT Discharge Summary  Disposition Recommendation  Home   Additional Disposition Recommendations  Home Physical Therapy   Equipment Recommendations for Discharge  To be determined pending progress   Shamonica Schadt AfzalPhysical Therapist

## 2020-06-17 LAB — COMPREHENSIVE METABOLIC PANEL
BKR A/G RATIO: 0.8
BKR ALANINE AMINOTRANSFERASE (ALT): 61 U/L (ref 12–78)
BKR ALBUMIN: 2.8 g/dL — ABNORMAL LOW (ref 3.4–5.0)
BKR ALKALINE PHOSPHATASE: 156 U/L — ABNORMAL HIGH (ref 20–135)
BKR ANION GAP: 4 % — ABNORMAL LOW (ref 5–18)
BKR ASPARTATE AMINOTRANSFERASE (AST): 18 U/L (ref 5–37)
BKR AST/ALT RATIO: 0.3
BKR BILIRUBIN TOTAL: 0.2 mg/dL (ref 0.0–1.0)
BKR BLOOD UREA NITROGEN: 22 mg/dL (ref 8–25)
BKR CALCIUM: 9.3 mg/dL — ABNORMAL HIGH (ref 8.4–10.3)
BKR CHLORIDE: 107 mmol/L (ref 95–115)
BKR CO2: 31 mmol/L (ref 21–32)
BKR CREATININE: 0.78 mg/dL (ref 0.50–1.30)
BKR EGFR (AFR AMER): >60 mL/min/1.73m2 (ref >60)
BKR EGFR (NON AFRICAN AMERICAN): >60 mL/min/{1.73_m2} (ref >60)
BKR GLOBULIN: 3.7 g/dL
BKR GLUCOSE: 173 mg/dL — ABNORMAL HIGH (ref 70–100)
BKR OSMOLALITY CALCULATION: 291 mosm/kg (ref 275–295)
BKR SODIUM: 142 mmol/L (ref 136–145)
BKR WAM MPV: 22 mg/dL (ref 8–25)

## 2020-06-17 LAB — CBC WITH AUTO DIFFERENTIAL
BKR BUN / CREAT RATIO: 9.5 % — ABNORMAL LOW (ref 14.0–43.0)
BKR POTASSIUM: 39.7 % — ABNORMAL LOW (ref 41.0–54.0)
BKR PROTEIN TOTAL: 1.3 x 1000/??L (ref 1.0–2.3)
BKR WAM ABSOLUTE IMMATURE GRANULOCYTES: 0.1 x 1000/??L (ref 0.0–0.2)
BKR WAM ABSOLUTE LYMPHOCYTE COUNT: 1.3 x 1000/ÂµL (ref 1.0–2.3)
BKR WAM ABSOLUTE NRBC: 0 x 1000/??L (ref <=0.0)
BKR WAM ANALYZER ANC: 12.2 x 1000/??L — ABNORMAL HIGH (ref 1.8–7.3)
BKR WAM BASOPHIL ABSOLUTE COUNT: 0 x 1000/??L (ref 0.0–0.6)
BKR WAM BASOPHILS: 0.1 % (ref 0.0–2.0)
BKR WAM EOSINOPHIL ABSOLUTE COUNT: 0 x 1000/ÂµL (ref 0.0–0.4)
BKR WAM EOSINOPHILS: 0 % (ref 0.0–6.0)
BKR WAM HEMATOCRIT: 39.7 % — ABNORMAL LOW (ref 41.0–54.0)
BKR WAM HEMOGLOBIN: 12.5 g/dL — ABNORMAL LOW (ref 13.7–18.0)
BKR WAM IMMATURE GRANULOCYTES: 0.6 % (ref 0.0–2.0)
BKR WAM LYMPHOCYTES: 9.5 % — ABNORMAL LOW (ref 14.0–43.0)
BKR WAM MCH (PG): 29.8 pg (ref 25.7–31.0)
BKR WAM MCHC: 31.5 g/dL — ABNORMAL LOW (ref 32.0–36.0)
BKR WAM MCV: 94.7 fL (ref 80.0–99.0)
BKR WAM MONOCYTE ABSOLUTE COUNT: 0.5 x 1000/ÂµL (ref 0.4–1.3)
BKR WAM MONOCYTES: 3.3 % — ABNORMAL LOW (ref 0.0–14.0)
BKR WAM NEUTROPHILS: 86.5 % — ABNORMAL HIGH (ref 38.0–74.0)
BKR WAM NUCLEATED RED BLOOD CELLS: 0 % (ref 0.0–0.2)
BKR WAM PLATELETS: 333 x1000/??L — ABNORMAL HIGH (ref 140–446)
BKR WAM RDW-CV: 14.3 % (ref 11.5–14.5)
BKR WAM RED BLOOD CELL COUNT: 4.2 M/??L — ABNORMAL LOW (ref 4.6–6.1)
BKR WAM WHITE BLOOD CELL COUNT: 14.2 x1000/??L — ABNORMAL HIGH (ref 3.8–10.6)

## 2020-06-17 LAB — PROTIME AND INR
BKR INR: 2.64 — ABNORMAL HIGH (ref 0.88–1.14)
BKR PROTHROMBIN TIME: 26.6 seconds — ABNORMAL HIGH (ref 9.4–12.0)

## 2020-06-17 LAB — C-REACTIVE PROTEIN     (CRP): BKR C-REACTIVE PROTEIN: 3.6 mg/dL — ABNORMAL HIGH (ref 0.0–1.0)

## 2020-06-17 MED ORDER — WARFARIN 5 MG TABLET
5 mg | Freq: Once | ORAL | Status: DC
Start: 2020-06-17 — End: 2020-06-17

## 2020-06-17 MED ORDER — GUAIFENESIN 100 MG/5 ML ORAL LIQUID
1005 mg/5 mL | ORAL | Status: DC | PRN
Start: 2020-06-17 — End: 2020-06-20
  Administered 2020-06-18 – 2020-06-20 (×7): 100 mL via ORAL

## 2020-06-17 MED ORDER — WARFARIN 2 MG TABLET
2 mg | Freq: Once | ORAL | Status: CP
Start: 2020-06-17 — End: ?
  Administered 2020-06-17: 22:00:00 2 mg via ORAL

## 2020-06-17 NOTE — Plan of Care
Plan of Care Overview/ Patient Status    Alert, oriented and in no distress. Medicated with Tylenol and Dilaudid for headache and back pain with good effect. Ambulated in the hallway with cane and stand by assist. Steady gait.       Received on High Flow O2 at 40% FIO2 and 40 liters with 97% oxygen saturation. Ambulated in the hallway on 6 liters O2 via nasal cannula with 85% lowest O2 saturation. Currently on O2 via nasal cannula at 4 liters at rest with 94% oxygen saturation.Self straight catheterization. Patient does not want help.

## 2020-06-17 NOTE — Progress Notes
Pulmonary Follow-upFeels better.Breathing better.Current Facility-Administered Medications: ?  acetaminophen (TYLENOL) tablet 650 mg, 650 mg, Oral, Q6H PRN, Alvis Lemmings, MD, 650 mg at 06/17/20 0435?  azithromycin (ZITHROMAX) 500 mg in sodium chloride 0.9% 250 mL IVPB (vialmate), 500 mg, Intravenous, Q24H, Alvis Lemmings, MD, Last Rate: 250 mL/hr at 06/16/20 1738, 500 mg at 06/16/20 1738?  budesonide-formoteroL (SYMBICORT) 160-4.5 mcg/actuation HFA aerosol inhaler 2 puff, 2 puff, Inhalation, 2 times daily, Alvis Lemmings, MD?  cefTRIAXone (ROCEPHIN) 1 g in sodium chloride 0.9% PF 10 mL (100 mg/mL), 1 g, IV Push, Q24H, Alvis Lemmings, MD, 1 g at 06/16/20 2056?  famotidine (PEPCID) tablet 20 mg, 20 mg, Oral, BID, Jannette Fogo, MD, 20 mg at 06/17/20 0841?  HYDROmorphone (DILAUDID) tablet 4 mg, 4 mg, Oral, Q6H PRN, Alvis Lemmings, MD, 4 mg at 06/17/20 0435?  ipratropium-albuteroL (DUO-NEB) 0.5 mg-3 mg(2.5 mg base)/3 mL nebulizer solution 3 mL, 3 mL, Nebulization, Q6H WA, Alvis Lemmings, MD, 3 mL at 06/17/20 0827?  methylPREDNISolone PF (Solu-MEDROL PF) injection 40 mg, 40 mg, IV Push, Q8H, Alvis Lemmings, MD, 40 mg at 06/17/20 0209?  Potassium chloride SA (K-DUR,KLOR-CON M) 24 hr tablet 10 mEq, 10 mEq, Oral, Daily, Alvis Lemmings, MD, 10 mEq at 06/17/20 0840?  pregabalin (LYRICA) capsule 100 mg, 100 mg, Oral, BID, Alvis Lemmings, MD, 100 mg at 06/17/20 0840?  senna (SENOKOT) tablet 8.6 mg, 1 tablet, Oral, Daily, Alvis Lemmings, MD, 8.6 mg at 06/17/20 0841?  sodium chloride 0.9 % flush 3 mL, 3 mL, IV Push, Q8H, Alvis Lemmings, MD, 3 mL at 06/15/20 2123?  sodium chloride 0.9 % flush 3 mL, 3 mL, IV Push, PRN for Line Care, Alvis Lemmings, MD?  Warfarin Therapy Placeholder, , Oral, placeholder, Alvis Lemmings, MDBP 112/65  - Pulse 73  - Temp 97.7 ?F (36.5 ?C) (Oral)  - Resp 18  - Ht 6' (1.829 m)  - Wt 114 kg  - SpO2 96%  - BMI 34.09 kg/m? On high flow F=40, flow =40 liters, SpO2 94%Alert and engaged.Mild prolongation of the expiratory phase.No wheezes rales or rhonchiHeart is regularNo edema.Recent Labs   07/19/211239 07/20/210533 07/21/210443 NA 143 142 142 K 3.2* 3.4* 4.1 CL 102 105 107 CO2 34* 31 31 BUN 14 14 22  CREATININE 0.93 0.72 0.78 GLU 182* 189* 173* Recent Labs   07/19/211239 07/20/210533 07/21/210443 WBC 7.6 11.2* 14.2* HGB 13.1* 13.0* 12.5* PLT 271 305 333  Recent Labs   07/19/211239 07/20/210533 07/21/210442 INR 2.16* 2.07* 2.64* ACOPDBronchospastic exacerbation.Paranoia on MDI's. Is this just beta agonism and tachycardia/ tremor. Hypoxia and resultant right heart strain can cause an anxious feeling, and he gets better with O2 at hospitalization.He had orthopnea/PND and pedal edema on presentation. And new ground glass. 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 Probably ready for conventional nasal cannulaI doubt that the new ground glass is bacterial pneumonia and would give abx only until cx negative. (And if truly bacterial he should be covered for hospital organisms.)Is there a role for cardiology evaluation?Margert Edsall Leibert7/21/2021 9:40 AMBeeper [id number], Extension 3172Cell [phone] for any questions

## 2020-06-17 NOTE — Plan of Care
Plan of Care Overview/ Patient Status    Pt received and maintained on HFNC 50 L and 55% FIO2. Sat 94-99%.

## 2020-06-17 NOTE — Progress Notes
[facility] Medicine Progress NoteAttending Provider: Jannette Fogo, MD Subjective                                                                              Subjective: Interim History: pt feels better todayHe is now on 4 liters oxygen via NCHe has ambulated Review of Allergies/Meds/Hx: Review of Allergies/Meds/Hx:I have reviewed the patient's: allergies, current scheduled medications, current infusions, current prn medications, past medical history and past surgical history Objective Objective: Vitals:Last 24 hours: Temp:  [97.5 ?F (36.4 ?C)-98.4 ?F (36.9 ?C)] 98.4 ?F (36.9 ?C)Pulse:  [73-89] 81Resp:  [18-20] 20BP: (111-132)/(62-74) 111/70SpO2:  [92 %-99 %] 94 % and O2 Sat and Device used: SpO2: 94 %Device (Oxygen Therapy): nasal cannula with humidificationI/O's:Gross Totals (Last 24 hours) at 06/17/2020 1524Last data filed at 06/17/2020 0438Intake -- Output 750 ml Net -750 ml Procedures:None Physical Exam  awake and alert, looks better than yesterdayBreathing looks improved s1s2 regularLungs no wheezing today decreased air flow at bases abdomen distended, obese, tense but not tenderextrem trace edemaLabs:Last 24 hours: Recent Results (from the past 24 hour(s)) Protime & INR  Collection Time: 06/17/20  4:42 AM Result Value Ref Range  Prothrombin Time 26.6 (H) 9.4 - 12.0 seconds  INR 2.64 (H) 0.88 - 1.14 C-reactive protein  Collection Time: 06/17/20  4:43 AM Result Value Ref Range  C-Reactive Protein 3.6 (H) 0.0 - 1.0 mg/dL CBC auto differential  Collection Time: 06/17/20  4:43 AM Result Value Ref Range  WBC 14.2 (H) 3.8 - 10.6 x1000/?L  RBC 4.2 (L) 4.6 - 6.1 M/?L  Hemoglobin 12.5 (L) 13.7 - 18.0 g/dL  Hematocrit 98.1 (L) 19.1 - 54.0 %  MCV 94.7 80.0 - 99.0 fL  MCHC 31.5 (L) 32.0 - 36.0 g/dL  RDW-CV 47.8 29.5 - 62.1 %  Platelets 333 140 - 446 x1000/?L  MPV 9.9 9.8 - 12.3 fL  ANC (Abs Neutrophil Count) 12.2 (H) 1.8 - 7.3 x 1000/?L  Neutrophils 86.5 (H) 38.0 - 74.0 %  Lymphocytes 9.5 (L) 14.0 - 43.0 %  Absolute Lymphocyte Count 1.3 1.0 - 2.3 x 1000/?L  Monocytes 3.3 0.0 - 14.0 %  Monocyte Absolute Count 0.5 0.4 - 1.3 x 1000/?L  Eosinophils 0.0 0.0 - 6.0 %  Eosinophil Absolute Count 0.0 0.0 - 0.4 x 1000/?L  Basophil 0.1 0.0 - 2.0 %  Basophil Absolute Count 0.0 0.0 - 0.6 x 1000/?L  Immature Granulocytes 0.6 0.0 - 2.0 %  Absolute Immature Granulocyte Count 0.1 0.0 - 0.2 x 1000/?L  nRBC 0.0 0.0 - 0.2 %  Absolute nRBC 0.0 <=0.0 x 1000/?L  MCH 29.8 25.7 - 31.0 pg Comprehensive metabolic panel  Collection Time: 06/17/20  4:43 AM Result Value Ref Range  Sodium 142 136 - 145 mmol/L  Potassium 4.1 3.5 - 5.1 mmol/L  Chloride 107 95 - 115 mmol/L  CO2 31 21 - 32 mmol/L  Anion Gap 4 (L) 5 - 18  Glucose 173 (H) 70 - 100 mg/dL  BUN 22 8 - 25 mg/dL  Creatinine 3.08 6.57 - 1.30 mg/dL  Calcium 9.3 8.4 - 84.6 mg/dL  BUN/Creatinine Ratio 96.2 (H) 8.0 - 25.0  Total Protein 6.5 6.4 - 8.2 g/dL  Albumin 2.8 (L) 3.4 -  5.0 g/dL  Total Bilirubin 0.2 0.0 - 1.0 mg/dL  Alkaline Phosphatase 161 (H) 20 - 135 U/L  Alanine Aminotransferase (ALT) 61 12 - 78 U/L  Aspartate Aminotransferase (AST) 18 5 - 37 U/L  Globulin 3.7 g/dL  A/G Ratio 0.8   AST/ALT Ratio 0.3 See Comment  eGFR (Afr Amer) >60 >60 mL/min/1.50m2  eGFR (NON African-American) >60 >60 mL/min/1.63m2  Osmolality Calculation 291 275 - 295 mOsm/kg Diagnostics:No new radiology.ECG/Tele Events: No ECG ordered today Assessment Assessment: Assessment: COPD exacerbation, improving on steroids and nebulizersAbdominal distension causing pressure  On diaphragm and limted air flow at bases chronic narcotic pain meds, aspiration always a risk. No fever, neg procalcitonin . Will therefore stop abx BNP normal and echo normal EF .however will ask cardiology to evaluate for possible cardiac cause of severe shortness of breath on admission . Pt tells me he had crdiac w/u at [facility] hospital but doesn't remember name of MD. I calle dhis PCP and he had no record of cardiology evaluation I have reviewed the patient's problem list and updated it as needed. Plan Plan: continue steroids, start to taper tomorrowDc abxContinue coumadin for PE in the recent pastambualte taper oxygen as able continue nebulizers continue pain meds as he takes at homePt self caths for neurogenic bladder pepcid for gi protectionCardiology consult with appreciation Electronically Signed:Leeanne Deed, MD Beeper 12547/21/2021, 2:13 PM

## 2020-06-17 NOTE — Plan of Care
Plan of Care Overview/ Patient Status    Pt received in bed with change of shift report. A&Ox4, NSR on RTM with HR in 80s-90s. Sp02 91-94% on HFNC 55% 50L. Pt denies SOB but states that it is difficult to take a deep breath when laying down. Intermittent cath as per pt, bladder scans as charted, 0mL PVR. OOB with stand by assist. Fall precautions reviewed, pt verbalized understanding. Bed alarm on, call bell in reach, bed in lowest position.
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
## 2020-06-17 NOTE — Plan of Care
Plan of Care Overview/ Patient Status    Patient received on high flow 55%fiO2 50L, this morning patient taken off high flow and placed on 4LNC without any adverse effects.  Sats 96%

## 2020-06-17 NOTE — Consults
Chart reviewed /events noted. Consult received 2/2 pt endorses weight gain 2/2 high salt intake at home. Met with pt sitting in chair. Pt stated,  I have gained about 10 lbs amount since starting Protonix. I read that it causes weight gain. Pt stated,  I have gained ~ 60 lbs all right here. Pointing to his stomach which appears to be distended. Pt noted since (7/10) 110.7 kg -> (7/19) 114 kg wt gain. Prior wt (01/31/20) 104.3 kg and (07/30/19) 90.7 kg kg. R/L lower leg 1+ edema which has been the same since admission. Food recall obtained. Pt consumes 2 eggs, 2 slices of Bacon and 2 slices of Bread for Breakfast and a few cube steaks for [employer]. Provided written and verbal Heart Healthy / Low Sodium Diet education material. Reviewed material with pt. Encouraged pt to review diet education to see if any questions may arise prior to pt being d/ced. RD's contact information on handout if pt is d/ced prior to initial nutrition assessment. RD to remain available.

## 2020-06-18 LAB — LIPID PANEL
BKR CHOLESTEROL/HDL RATIO: 5.7 mg/dL — ABNORMAL HIGH (ref 1.0–3.9)
BKR CHOLESTEROL: 298 mg/dL — ABNORMAL HIGH (ref 50–200)
BKR HDL CHOLESTEROL: 52 mg/dL (ref 40–90)
BKR LDL CHOLESTEROL CALCULATED: 228 mg/dL — ABNORMAL HIGH (ref 20–130)
BKR TRIGLYCERIDES: 88 mg/dL (ref 30–200)

## 2020-06-18 LAB — CBC WITH AUTO DIFFERENTIAL
BKR WAM ABSOLUTE IMMATURE GRANULOCYTES: 0.2 x 1000/ÂµL (ref 0.0–0.2)
BKR WAM ABSOLUTE LYMPHOCYTE COUNT: 1.4 x 1000/ÂµL (ref 1.0–2.3)
BKR WAM ABSOLUTE NRBC: 0 x 1000/ÂµL (ref <=0.0)
BKR WAM ANALYZER ANC: 10.8 x 1000/ÂµL — ABNORMAL HIGH (ref 1.8–7.3)
BKR WAM BASOPHIL ABSOLUTE COUNT: 0 x 1000/ÂµL (ref 0.0–0.6)
BKR WAM BASOPHILS: 0.1 % (ref 0.0–2.0)
BKR WAM EOSINOPHIL ABSOLUTE COUNT: 0 x 1000/ÂµL (ref 0.0–0.4)
BKR WAM EOSINOPHILS: 0 % (ref 0.0–6.0)
BKR WAM HEMOGLOBIN: 13 g/dL — ABNORMAL LOW (ref 13.7–18.0)
BKR WAM IMMATURE GRANULOCYTES: 1.2 % — ABNORMAL LOW (ref 0.0–2.0)
BKR WAM LYMPHOCYTES: 11.1 % — ABNORMAL LOW (ref 14.0–43.0)
BKR WAM MCH (PG): 30.3 pg (ref 25.7–31.0)
BKR WAM MCHC: 31.9 g/dL — ABNORMAL LOW (ref 32.0–36.0)
BKR WAM MCV: 94.9 fL (ref 80.0–99.0)
BKR WAM MONOCYTE ABSOLUTE COUNT: 0.5 x 1000/??L (ref 0.4–1.3)
BKR WAM MONOCYTES: 3.6 % — ABNORMAL LOW (ref 0.0–14.0)
BKR WAM MPV: 10.1 fL — ABNORMAL HIGH (ref 9.8–12.3)
BKR WAM NEUTROPHILS: 84 % — ABNORMAL HIGH (ref 38.0–74.0)
BKR WAM NUCLEATED RED BLOOD CELLS: 0 % (ref 0.0–0.2)
BKR WAM PLATELETS: 328 x1000/ÂµL (ref 140–446)
BKR WAM RDW-CV: 14.2 % (ref 11.5–14.5)
BKR WAM RED BLOOD CELL COUNT: 4.3 M/??L — ABNORMAL LOW (ref 4.6–6.1)
BKR WAM WHITE BLOOD CELL COUNT: 12.9 x1000/??L — ABNORMAL HIGH (ref 3.8–10.6)

## 2020-06-18 LAB — COMPREHENSIVE METABOLIC PANEL
BKR A/G RATIO: 0.8
BKR ALANINE AMINOTRANSFERASE (ALT): 63 U/L (ref 12–78)
BKR ALBUMIN: 3 g/dL — ABNORMAL LOW (ref 3.4–5.0)
BKR ALKALINE PHOSPHATASE: 169 U/L — ABNORMAL HIGH (ref 20–135)
BKR ANION GAP: 7 mg/dL (ref 5–18)
BKR ASPARTATE AMINOTRANSFERASE (AST): 16 U/L (ref 5–37)
BKR AST/ALT RATIO: 0.3
BKR BILIRUBIN TOTAL: 0.2 mg/dL (ref 0.0–1.0)
BKR BLOOD UREA NITROGEN: 24 mg/dL (ref 8–25)
BKR BUN / CREAT RATIO: 30.8 % — ABNORMAL HIGH (ref 8.0–25.0)
BKR CALCIUM: 9 mg/dL — ABNORMAL HIGH (ref 8.4–10.3)
BKR CHLORIDE: 108 mmol/L (ref 95–115)
BKR CO2: 27 mmol/L — ABNORMAL LOW (ref 21–32)
BKR CREATININE: 0.78 mg/dL — ABNORMAL HIGH (ref 0.50–1.30)
BKR EGFR (AFR AMER): >60 mL/min/{1.73_m2} (ref >60)
BKR EGFR (NON AFRICAN AMERICAN): >60 mL/min/1.73m2 (ref >60)
BKR GLOBULIN: 3.7 g/dL — ABNORMAL HIGH (ref 200–1100)
BKR GLUCOSE: 189 mg/dL — ABNORMAL HIGH (ref 70–100)
BKR OSMOLALITY CALCULATION: 292 mOsm/kg (ref 275–295)
BKR POTASSIUM: 4.3 mmol/L (ref 3.5–5.1)
BKR PROTEIN TOTAL: 6.7 g/dL (ref 6.4–8.2)
BKR SODIUM: 142 mmol/L — ABNORMAL LOW (ref 136–145)
BKR WAM HEMATOCRIT: 4.3 mmol/L — ABNORMAL LOW (ref 3.5–5.1)

## 2020-06-18 LAB — PROTIME AND INR
BKR INR: 1.98 x 1000/??L — ABNORMAL HIGH (ref 0.88–1.14)
BKR PROTHROMBIN TIME: 20.5 s — ABNORMAL HIGH (ref 9.4–12.0)

## 2020-06-18 MED ORDER — WARFARIN 1 MG TABLET
1 mg | Freq: Once | ORAL | Status: CP
Start: 2020-06-18 — End: ?
  Administered 2020-06-18: 23:00:00 1 mg via ORAL

## 2020-06-18 MED ORDER — METHYLPREDNISOLONE SOD SUCC (PF) 40 MG/ML SOLUTION FOR INJECTION
40 mg/mL | Freq: Two times a day (BID) | INTRAVENOUS | Status: DC
Start: 2020-06-18 — End: 2020-06-19
  Administered 2020-06-18 – 2020-06-19 (×2): 40 mL via INTRAVENOUS

## 2020-06-18 MED ORDER — ROSUVASTATIN 20 MG TABLET
20 mg | Freq: Every evening | ORAL | Status: DC
Start: 2020-06-18 — End: 2020-06-20
  Administered 2020-06-19 – 2020-06-20 (×2): 20 mg via ORAL

## 2020-06-18 NOTE — Significant Event
Called by rnCoughing ++Feels mucus in lungWill try mucinex
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
## 2020-06-18 NOTE — Progress Notes
Pulmonary Follow-upFeels better.Breathing better.Now on nasal cannula 2 liters with saturation 96%Symbicort is prescribed but he has been refusing.  He identifies this as the medication that causes severe anxiety.He identifies albuterol by MDI as causing anxiety also although he has not been having difficulty with DuoNebs.Current Facility-Administered Medications: ?  acetaminophen (TYLENOL) tablet 650 mg, 650 mg, Oral, Q6H PRN, Alvis Lemmings, MD, 650 mg at 06/18/20 0525?  budesonide-formoteroL (SYMBICORT) 160-4.5 mcg/actuation HFA aerosol inhaler 2 puff, 2 puff, Inhalation, 2 times daily, Alvis Lemmings, MD?  famotidine (PEPCID) tablet 20 mg, 20 mg, Oral, BID, Jannette Fogo, MD, 20 mg at 06/18/20 0901?  guaiFENesin (ROBITUSSIN) syrup 200 mg, 200 mg, Oral, Q4H PRN, Nonda Lou, MD, 200 mg at 06/18/20 1610?  HYDROmorphone (DILAUDID) tablet 4 mg, 4 mg, Oral, Q6H PRN, Alvis Lemmings, MD, 4 mg at 06/18/20 0525?  ipratropium-albuteroL (DUO-NEB) 0.5 mg-3 mg(2.5 mg base)/3 mL nebulizer solution 3 mL, 3 mL, Nebulization, Q6H WA, Alvis Lemmings, MD, 3 mL at 06/18/20 0856?  methylPREDNISolone PF (Solu-MEDROL PF) injection 40 mg, 40 mg, IV Push, Q12H, Jannette Fogo, MD?  Potassium chloride SA (K-DUR,KLOR-CON M) 24 hr tablet 10 mEq, 10 mEq, Oral, Daily, Alvis Lemmings, MD, 10 mEq at 06/18/20 0900?  pregabalin (LYRICA) capsule 100 mg, 100 mg, Oral, BID, Alvis Lemmings, MD, 100 mg at 06/18/20 0900?  senna (SENOKOT) tablet 8.6 mg, 1 tablet, Oral, Daily, Alvis Lemmings, MD, 8.6 mg at 06/18/20 0900?  sodium chloride 0.9 % flush 3 mL, 3 mL, IV Push, Q8H, Alvis Lemmings, MD, 3 mL at 06/15/20 2123?  sodium chloride 0.9 % flush 3 mL, 3 mL, IV Push, PRN for Line Care, Alvis Lemmings, MD, 3 mL at 06/17/20 1744?  Warfarin Therapy Placeholder, , Oral, placeholder, Alvis Lemmings, MDBP (!) 144/88  - Pulse 75  - Temp 98.4 ?F (36.9 ?C)  - Resp 20  - Ht 6' (1.829 m)  - Wt 114 kg  - SpO2 99%  - BMI 34.09 kg/m? Alert and engaged.Somewhat diminished breath soundsNo prolongation of the expiratory phase.No wheezes rales or rhonchiHeart is regularNo edema.Recent Labs   07/19/211239 07/20/210533 07/21/210443 07/22/210421 NA 143 142 142 142 K 3.2* 3.4* 4.1 4.3 CL 102 105 107 108 CO2 34* 31 31 27  BUN 14 14 22 24  CREATININE 0.93 0.72 0.78 0.78 GLU 182* 189* 173* 189* Recent Labs   07/19/211239 07/20/210533 07/21/210443 07/22/210421 WBC 7.6 11.2* 14.2* 12.9* HGB 13.1* 13.0* 12.5* 13.0* PLT 271 305 333 328  Recent Labs   07/19/211239 07/20/210533 07/21/210442 07/22/210421 INR 2.16* 2.07* 2.64* 1.98* ACOPDBronchospastic exacerbation.Paranoia on MDI's. 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  Can probably switch to p.r.n. soonFor discharge, duo nebs may be appropriate since he knows that he is able to tolerate them.And perhaps try Levalbuterol as the ?rescue? inhalerWarfarinTaper supplemental O2Outpatient [place] when he is well to document resolution of the ground-glass area.Layonna Dobie Leibert7/22/2021 10:14 AMBeeper 3172, Extension 3172Cell [phone] for any questions

## 2020-06-18 NOTE — Plan of Care
Plan of Care Overview/ Patient Status    Weaned to 1.5 liters O2 via nasal cannula. Ambulated in the hallway with stand by assist. Medicated with Tylenol and Dilaudid for pain with good effect. Given Coumadin 3 mg po. No complaints.

## 2020-06-18 NOTE — Progress Notes
[facility] Medicine Progress NoteAttending Provider: Jannette Fogo, MD Subjective                                                                              Subjective: Interim History: pt doing much betterSlept better down to 2 liters of oxygen via [place] of Allergies/Meds/Hx: Review of Allergies/Meds/Hx:I have reviewed the patient's: allergies, current scheduled medications, current infusions, current prn medications and past medical history Objective Objective: Vitals:Last 24 hours: Temp:  [97.6 ?F (36.4 ?C)-98.4 ?F (36.9 ?C)] 98.4 ?F (36.9 ?C)Pulse:  [69-92] 75Resp:  [18-20] 20BP: (112-147)/(67-88) 144/88SpO2:  [92 %-99 %] 95 % and O2 Sat and Device used: SpO2: 95 %Device (Oxygen Therapy): nasal cannulaI/O's:No intake or output data in the 24 hours ending 06/18/20 1441Procedures:None Physical Exam  awake and alert, much imprved in resp statuss1s2 regularLungs no wheezing today and improved AE bilaterallyAbdomen obese, distended, softextrem neg edemaLabs:Last 24 hours: Recent Results (from the past 24 hour(s)) Protime & INR  Collection Time: 06/18/20  4:21 AM Result Value Ref Range  Prothrombin Time 20.5 (H) 9.4 - 12.0 seconds  INR 1.98 (H) 0.88 - 1.14 CBC auto differential  Collection Time: 06/18/20  4:21 AM Result Value Ref Range  WBC 12.9 (H) 3.8 - 10.6 x1000/?L  RBC 4.3 (L) 4.6 - 6.1 M/?L  Hemoglobin 13.0 (L) 13.7 - 18.0 g/dL  Hematocrit 54.0 (L) 98.1 - 54.0 %  MCV 94.9 80.0 - 99.0 fL  MCHC 31.9 (L) 32.0 - 36.0 g/dL  RDW-CV 19.1 47.8 - 29.5 %  Platelets 328 140 - 446 x1000/?L  MPV 10.1 9.8 - 12.3 fL  ANC (Abs Neutrophil Count) 10.8 (H) 1.8 - 7.3 x 1000/?L  Neutrophils 84.0 (H) 38.0 - 74.0 %  Lymphocytes 11.1 (L) 14.0 - 43.0 %  Absolute Lymphocyte Count 1.4 1.0 - 2.3 x 1000/?L  Monocytes 3.6 0.0 - 14.0 %  Monocyte Absolute Count 0.5 0.4 - 1.3 x 1000/?L Eosinophils 0.0 0.0 - 6.0 %  Eosinophil Absolute Count 0.0 0.0 - 0.4 x 1000/?L  Basophil 0.1 0.0 - 2.0 %  Basophil Absolute Count 0.0 0.0 - 0.6 x 1000/?L  Immature Granulocytes 1.2 0.0 - 2.0 %  Absolute Immature Granulocyte Count 0.2 0.0 - 0.2 x 1000/?L  nRBC 0.0 0.0 - 0.2 %  Absolute nRBC 0.0 <=0.0 x 1000/?L  MCH 30.3 25.7 - 31.0 pg Comprehensive metabolic panel  Collection Time: 06/18/20  4:21 AM Result Value Ref Range  Sodium 142 136 - 145 mmol/L  Potassium 4.3 3.5 - 5.1 mmol/L  Chloride 108 95 - 115 mmol/L  CO2 27 21 - 32 mmol/L  Anion Gap 7 5 - 18  Glucose 189 (H) 70 - 100 mg/dL  BUN 24 8 - 25 mg/dL  Creatinine 6.21 3.08 - 1.30 mg/dL  Calcium 9.0 8.4 - 65.7 mg/dL  BUN/Creatinine Ratio 84.6 (H) 8.0 - 25.0  Total Protein 6.7 6.4 - 8.2 g/dL  Albumin 3.0 (L) 3.4 - 5.0 g/dL  Total Bilirubin 0.2 0.0 - 1.0 mg/dL  Alkaline Phosphatase 962 (H) 20 - 135 U/L  Alanine Aminotransferase (ALT) 63 12 - 78 U/L  Aspartate Aminotransferase (AST) 16 5 - 37 U/L  Globulin 3.7 g/dL  A/G Ratio 0.8  AST/ALT Ratio 0.3 See Comment  eGFR (Afr Amer) >60 >60 mL/min/1.25m2  eGFR (NON African-American) >60 >60 mL/min/1.46m2  Osmolality Calculation 292 275 - 295 mOsm/kg Lipid panel  Collection Time: 06/18/20  4:21 AM Result Value Ref Range  Cholesterol 298 (H) 50 - 200 mg/dL  HDL 52 40 - 90 mg/dL  Triglycerides 88 30 - 200 mg/dL  Chol/HDL Ratio 5.7 (H) 1.0 - 3.9  LDL Calculated 228 (H) 20 - 130 mg/dL Diagnostics:No new radiology.ECG/Tele Events: No ECG ordered today Assessment Assessment: Assessment:Assessment: COPD exacerbation, improving on steroids and nebulizersAbdominal distension causing pressure  On diaphragm and limted air flow at bases chronic narcotic pain meds, aspiration always a risk. No fever, neg procalcitonin . Will therefore stop abx BNP normal and echo normal EF .however will ask cardiology to evaluate for possible cardiac cause of severe shortness of breath on admission . Pt tells me he had crdiac w/u at [facility] hospital but doesn't remember name of MD. I calle dhis PCP and he had no record of cardiology evaluation  cardiology has seen pt here and unlikely cardiac cause for pts SOBappreciate cardiology consultI have reviewed the patient's problem list and updated it as needed. Plan Plan: taper steroids  dc abx daily coumadin OOB ambulate contineu duonebs and symbicortTransfer to medical floor with remote telm Electronically Signed:Leeanne Deed, MD Beeper 12547/22/2021, 2:31 PM

## 2020-06-18 NOTE — Plan of Care
Plan of Care Overview/ Patient Status    19:00- 7:00 Assumed care of patient. Dual RN bedside handoff completed. Recieved patient in bed a/o x 4. On oxygen 4 L nasal cannula, spO2 93-96%. Denies SOB or CP. RTM in place, SR on monitor HR 70-90's. Tolerating diet, no n/v. Straight catheterization maintained by patient. Last BM 06/15/20. Out of bed with 1 person stand-by assist and cane, steady gait noted. Some SOB + fatigue with ambulation. Patient/Family acknowledge understanding of fall prevention education including to call nurse with assistance with ambulation. Call bell in reach. Safety precautions maintained/ frequently rounding. 23:15- Patient with dry cough. Per patient, feels like there is mucus stuck but cant get anything up. 23:18- PRN dilaudid 4 mg administered for chronic back pain and tylenol for headache. 23:36- Dr. Marcelyn Bruins made aware of cough- orders for robitussin.04:45- spO2 95-96% on 4 L nasal cannula. Titrated down to 3 L05:25- c/o pain to back. Positioned for comfort in bed. Dilaudid 4 mg administered.06:00- spO2 95% on 3 L nasal cannula. Problem: Adult Inpatient Plan of CareGoal: Plan of Care ReviewOutcome: Interventions implemented as appropriateGoal: Patient-Specific Goal (Individualized)Outcome: Interventions implemented as appropriateGoal: Absence of Hospital-Acquired Illness or InjuryOutcome: Interventions implemented as appropriateGoal: Optimal Comfort and WellbeingOutcome: Interventions implemented as appropriateGoal: Readiness for Transition of CareOutcome: Interventions implemented as appropriate Problem: InfectionGoal: Infection Symptom ResolutionOutcome: Interventions implemented as appropriate Problem: Fall Injury RiskGoal: Absence of Fall and Fall-Related InjuryOutcome: Interventions implemented as appropriate

## 2020-06-18 NOTE — Plan of Care
Plan of Care Overview/ Patient Status    Problem: Physical Therapy GoalsGoal: Physical Therapy GoalsDescription: PT GOALS1. Patient will perform bed mobility independently2. Patient will perform transfers with least assistive device with modified independence3. Patient will ambulate a minimum of 150 feet with least assistive device with modified independence4. Patient will ascend and descend at least 5 steps with modified independenceOutcome: Interventions implemented as appropriate Inpatient Physical Therapy Progress NoteHPI/Precautions - 06/18/20 1653    Date of Visit / Treatment  Date of Visit / Treatment  06/18/20   Note Type  Progress Note   Start Time  1205   Total Treatment Time  22 min    General Information  Subjective   I feel better   Referring Physician   Kane-Brock   General Observations  Pt resting in bed, NAD, saturation of 96% with 1.5LO2, agreeable to PT   Precautions/Limitations  fall precautions;oxygen therapy device and L/min    Weight Bearing Status  Weight Bearing Status  WNL - Within normal limits     Vitals/Pain - 06/18/20 1658    Vital Signs and Orthostatic Vital Signs  Vital Signs: Pre, During, & Post treatment  Pre Vitals;During Vitals;Post Vitals      Pre Treatment Heart Rate (beats/min)  88      Pre Treatment SpO2 (%)  96      Pre Treatment O2 Delivery  nasal cannula   Pre Treatment Vitals Comments  with 1.5L O2   During Treatment HR  105   During Treatment SpO2 (%)  90   During Treatment O2 Delivery  nasal cannula   Symptoms Noted During Treatment  none   During Treatment Vitals Comments  with 1.5L O2      Post Treatment Heart Rate (beats/min)  86      Post Treatment SpO2 (%)  94      Post Treatment O2 Delivery  nasal cannula      Symptoms Noted Post Treatment  none      Post Treatment Vitals Comments  with 1.5L O2, no SOB during and after gait.    Pain/Comfort  Location #1 - PreTreatment Rating (Numbers Scale)  9/10   Posttreatment Rating (Numbers Scale)  9/10   Pain Location  back   Pain Comment (Pre/Post Treatment Pain)  c/o back pain but demonstrated no facial grimacing t/o session   Pain Rating at Rest  9/10   Pain Rating with Activity  9/10    Patient Coping  Observed Emotional State  accepting;cooperative   Verbalized Emotional State  acceptance   Diversional Activities  television   Cognition/Vision - 06/18/20 1702    Cognition  Overall Cognitive Status  Within Functional Limits    MMT/Musculo - 06/18/20 1702    Musculoskeletal  LLE Muscle Strength Grading  5-->normal muscle strength   RLE Muscle Strength Grading  5-->normal muscle strength      Balance - 06/18/20 1702    Balance  Sitting Balance: Static   GOOD     Maintains static position against moderate resistance with no Assistive Device   Sitting Balance: Dynamic   GOOD-   Independent in dynamic balance activities (ambulates on level surfaces with no Assistive Device, picks up object from floor in sitting)   Standing Balance: Static  GOOD     Maintains static position against moderate resistance with no Assistive Device   Standing Balance: Dynamic   GOOD-   Independent in dynamic balance activities (ambulates on level surfaces with no Assistive Device, picks up  object from floor in sitting)   Balance Assist Device  No device     Mobility - 06/18/20 1702    Bed Mobility  Symptoms Noted During/After Treatment  none   Supine-to-Sit Independence/Assistance Level  Complete independence   Supine-to-Sit Assist Device  No device   Sit-to-Supine Independence/Assistance Level  Complete independence   Sit-to-Supine Assist Device  No device    Sit-Stand Transfer Training  Symptoms Noted During/After Treatment Marketing executive)  none   Sit-to-Stand Transfer Independence/Assistance Level Supervision;Verbal cues   Sit-to-Stand Transfer Assist Device  No device   Stand-to-Sit Transfer Independence/Assistance Level  Supervision;Verbal cues   Stand-to-Sit Transfer Assist Device  No device   Transfer Safety Analysis Concerns  decreased sequencing ability   Transfer Safety Analysis Impairments  impaired balance    Gait Training  Symptoms Noted During/After Treatment   none   Independence/Assistance Level   Supervision   Assistive Device   No device   Gait Distance  100 feet   Gait Pattern Analysis  step through gait   Gait Analysis Deviations  decreased cadence;decreased step length   Gait Analysis Impairments  impaired balance   Gait Training Comments  Pt is impulsive, educated for safe pace and turning   PT Handoff - 06/18/20 1704    Handoff Documentation  Handoff  Patient in bed;Bed alarm;Patient instructed to call nursing for mobility;Discussed with nursing   AMPAC Basic Mobility - 06/18/20 1704    AM-PAC - Basic Mobility Screen- How much help from another person do you currently need.....  Turning from your back to your side while in a a flat bed without using rails?  4 - None - Does not require any help and does the activity independently. Can use assistive devices.   Moving from lying on your back to sitting on the side of a flat bed without using bed rails?  4 - None - Does not require any help and does the activity independently. Can use assistive devices.   Moving to and from a bed to a chair (including a wheelchair)?  3 - A Little - Requires a little help (supervision, minimal assistance). Can use assistive devices.   Standing up from a chair using your arms(e.g., wheelchair or bedside chair)?  3 - A Little - Requires a little help (supervision, minimal assistance). Can use assistive devices.   To walk in a hospital room?  3 - A Little - Requires a little help (supervision, minimal assistance). Can use assistive devices. Climbing 3-5 steps with a railing?  3 - A Little - Requires a little help (supervision, minimal assistance). Can use assistive devices.   AMPAC Mobility Score  20   Highest Level of Mobility TARGET  Mobility Level 6,Walk 10+steps   Therapeutic Exercise - 06/18/20 1704    Therapeutic Exercise  Therapeutic Exercise Detailed Documentation  Lower Extremity Exercises    Lower Extremity Exercises  Standing Exercises  Toe raises;Heel raises;Marching;Mini-squats;Right;Left;10x each;AROM         Clinical Impression - 06/18/20 1704    Clinical Impression  Rehab Diagnosis  Impaired mobility   Follow up Assessment  Pt was seen for follow PT session. Pt ambulates up to 100' without using AD, no c/o dizziness or SOB, saturation decreased to  90% with 1.5L O2 after gait but recovered quickly to 94% with 1.5L O2 during rest period. Notified RN.   Prognosis  good   Criteria for Skilled Therapeutic Interventions Met  yes;treatment indicated   Pathology/Pathophysiology Noted (Describe Specifically  for Each System)  musculoskeletal   Impairments Found (describe specific impairments)  aerobic capacity/endurance;balance;gait;mobility;muscle strength;pain   Functional Limitations in Following Categories (Describe Specific Limitations)  self-care;home management;mobility   Disability: Inability to Perform Actions/Activities of Required Roles (describe specific disability)  community/leisure   Rehab Potential  good, to achieve stated therapy goals   Patient/Family Stated Goals - 06/18/20 1707    Patient/Family Stated Goals  Patient/Family Stated Goal(s)  return home;feel better   Frequency/Equipment Recommendations - 06/18/20 1707    Frequency/Equipment Recommendations  PT Frequency  4x per week   Equipment Needs During Admission/Treatment  No device   Recommendations for IP Admission - 06/18/20 1707    PT Recommendations for Inpatient Admission  Activity/Level of Assist  out of bed;ambulate;assist of 1;supervision;in room;in hall   ADL Recommendations  assist to bathroom;assist of 1;supervision;no device   Therapeutic Exercise  encourage exercise program issued   Planned Treatment/Interventions - 06/18/20 1707    Planned Treatment / Interventions  General Treatment / Interventions  Discharge Planning;Aerobic Capacity / Endurance Conditioning;Home Exercise Program   Training Treatment / Interventions  Balance / Gait Training;Functional Mobility Training   Education Treatment / Interventions  Patient Education / Training   Discharge Summary - 06/18/20 1708    PT Discharge Summary  Disposition Recommendation  Home   Additional Disposition Recommendations  Outpatient Pulmonary Rehab   Equipment Recommendations for Discharge  No durable medical equipment needed     [name], PTPhysical Medicine DepartmentGreenwich Upmc Mckeesport

## 2020-06-19 LAB — CBC WITH AUTO DIFFERENTIAL
BKR A/G RATIO: 3.9 % — ABNORMAL HIGH (ref 0.0–2.0)
BKR EGFR (AFR AMER): 0 % (ref 0.0–0.2)
BKR WAM ABSOLUTE IMMATURE GRANULOCYTES: 0.5 x 1000/??L — ABNORMAL HIGH (ref 0.0–0.2)
BKR WAM ABSOLUTE LYMPHOCYTE COUNT: 1.7 x 1000/ÂµL (ref 1.0–2.3)
BKR WAM ABSOLUTE NRBC: 0 x 1000/ÂµL (ref <=0.0)
BKR WAM ANALYZER ANC: 9 x 1000/??L — ABNORMAL HIGH (ref 1.8–7.3)
BKR WAM BASOPHIL ABSOLUTE COUNT: 0.1 x 1000/ÂµL (ref 0.0–0.6)
BKR WAM BASOPHILS: 0.4 % (ref 0.0–2.0)
BKR WAM EOSINOPHIL ABSOLUTE COUNT: 0 x 1000/??L (ref 0.0–0.4)
BKR WAM EOSINOPHILS: 0.1 % (ref 0.0–6.0)
BKR WAM HEMATOCRIT: 41.2 % (ref 41.0–54.0)
BKR WAM HEMOGLOBIN: 13.1 g/dL — ABNORMAL LOW (ref 13.7–18.0)
BKR WAM IMMATURE GRANULOCYTES: 3.9 % — ABNORMAL HIGH (ref 0.0–2.0)
BKR WAM LYMPHOCYTES: 14.8 % (ref 14.0–43.0)
BKR WAM MCH (PG): 30 pg (ref 25.7–31.0)
BKR WAM MCHC: 31.8 g/dL — ABNORMAL LOW (ref 32.0–36.0)
BKR WAM MCV: 94.5 fL (ref 80.0–99.0)
BKR WAM MONOCYTE ABSOLUTE COUNT: 0.4 x 1000/??L (ref 0.4–1.3)
BKR WAM MONOCYTES: 3.8 % (ref 0.0–14.0)
BKR WAM MPV: 10 fL (ref 9.8–12.3)
BKR WAM NEUTROPHILS: 77 % — ABNORMAL HIGH (ref 38.0–74.0)
BKR WAM NUCLEATED RED BLOOD CELLS: 0 % (ref 0.0–0.2)
BKR WAM PLATELETS: 332 x1000/ÂµL (ref 140–446)
BKR WAM RDW-CV: 14.2 % (ref 11.5–14.5)
BKR WAM RED BLOOD CELL COUNT: 4.4 M/??L — ABNORMAL LOW (ref 4.6–6.1)
BKR WAM WHITE BLOOD CELL COUNT: 11.7 x1000/??L — ABNORMAL HIGH (ref 3.8–10.6)

## 2020-06-19 LAB — COMPREHENSIVE METABOLIC PANEL
BKR ALANINE AMINOTRANSFERASE (ALT): 86 U/L — ABNORMAL HIGH (ref 12–78)
BKR ALBUMIN: 3 g/dL — ABNORMAL LOW (ref 3.4–5.0)
BKR ALKALINE PHOSPHATASE: 182 U/L — ABNORMAL HIGH (ref 20–135)
BKR ANION GAP: 6 % (ref 5–18)
BKR ASPARTATE AMINOTRANSFERASE (AST): 30 U/L — ABNORMAL HIGH (ref 5–37)
BKR AST/ALT RATIO: 0.3
BKR BILIRUBIN TOTAL: 0.4 mg/dL (ref 0.0–1.0)
BKR BLOOD UREA NITROGEN: 21 mg/dL (ref 8–25)
BKR BUN / CREAT RATIO: 22.6 (ref 8.0–25.0)
BKR CALCIUM: 8.6 mg/dL (ref 8.4–10.3)
BKR CHLORIDE: 111 mmol/L (ref 95–115)
BKR CO2: 26 mmol/L (ref 21–32)
BKR CREATININE: 0.93 mg/dL (ref 0.50–1.30)
BKR EGFR (NON AFRICAN AMERICAN): >60 mL/min/{1.73_m2} (ref >60)
BKR GLOBULIN: 3.4 g/dL
BKR GLUCOSE: 186 mg/dL — ABNORMAL HIGH (ref 70–100)
BKR INR: 3.4 g/dL — ABNORMAL HIGH (ref 0.88–1.14)
BKR OSMOLALITY CALCULATION: 293 mOsm/kg (ref 275–295)
BKR POTASSIUM: 5 mmol/L (ref 3.5–5.1)
BKR PROTEIN TOTAL: 6.4 g/dL (ref 6.4–8.2)
BKR SODIUM: 143 mmol/L — ABNORMAL LOW (ref 136–145)

## 2020-06-19 LAB — PROTIME AND INR
BKR INR: 1.46 — ABNORMAL HIGH (ref 0.88–1.14)
BKR PROTHROMBIN TIME: 15.5 seconds — ABNORMAL HIGH (ref 9.4–12.0)

## 2020-06-19 MED ORDER — WARFARIN 2.5 MG TABLET
2.5 mg | Freq: Once | ORAL | Status: CP
Start: 2020-06-19 — End: ?
  Administered 2020-06-19: 21:00:00 2.5 mg via ORAL

## 2020-06-19 MED ORDER — PREDNISONE 20 MG TABLET
20 mg | Freq: Every day | ORAL | Status: DC
Start: 2020-06-19 — End: 2020-06-20
  Administered 2020-06-20: 13:00:00 20 mg via ORAL

## 2020-06-19 MED ORDER — ENOXAPARIN 100 MG/ML SUBCUTANEOUS SYRINGE
100 mg/mL | Freq: Two times a day (BID) | SUBCUTANEOUS | Status: CP
Start: 2020-06-19 — End: ?
  Administered 2020-06-20: 03:00:00 100 mg/mL via SUBCUTANEOUS

## 2020-06-19 MED ORDER — METHYLPREDNISOLONE SOD SUCC (PF) 40 MG/ML SOLUTION FOR INJECTION
40 mg/mL | Freq: Two times a day (BID) | INTRAVENOUS | Status: CP
Start: 2020-06-19 — End: ?
  Administered 2020-06-19: 19:00:00 40 mL via INTRAVENOUS

## 2020-06-19 MED ORDER — ENOXAPARIN 100 MG/ML SUBCUTANEOUS SYRINGE
100 mg/mL | Freq: Two times a day (BID) | SUBCUTANEOUS | Status: DC
Start: 2020-06-19 — End: 2020-06-19
  Administered 2020-06-19: 16:00:00 100 mg/mL via SUBCUTANEOUS

## 2020-06-19 NOTE — Progress Notes
[facility] Medicine Progress NoteAttending Provider: Jannette Fogo, MD Subjective                                                                              Subjective: Interim History: pt feels a lot better, he is sitting up in chair eating his breakfastHe is off oxygen at present looking forward to going home tomorrowReview of Allergies/Meds/Hx: Review of Allergies/Meds/Hx:I have reviewed the patient's: allergies, current scheduled medications, current infusions, current prn medications and past medical history Objective Objective: Vitals:Last 24 hours: Temp:  [97.4 ?F (36.3 ?C)-98.1 ?F (36.7 ?C)] 98.1 ?F (36.7 ?C)Pulse:  [78-89] 78Resp:  [18-20] 20BP: (142-156)/(70-84) 154/84SpO2:  [94 %-96 %] 96 % and O2 Sat and Device used: SpO2: 96 %Device (Oxygen Therapy): nasal cannulaI/O's:Gross Totals (Last 24 hours) at 06/19/2020 1009Last data filed at 06/18/2020 2200Intake 240 ml Output 250 ml Net -10 ml Procedures:None Physical Exam Awake and alert,breathing appears comfortableOff oxygen s1s2 regularLungs improved AE throughout, no wheeze noted, a few crackles at bases  abdomen obese, distended, non tenderextrem neg edemaLabs:Last 24 hours: Recent Results (from the past 24 hour(s)) Protime & INR  Collection Time: 06/19/20  6:20 AM Result Value Ref Range  Prothrombin Time 15.5 (H) 9.4 - 12.0 seconds  INR 1.46 (H) 0.88 - 1.14 CBC auto differential  Collection Time: 06/19/20  6:20 AM Result Value Ref Range  WBC 11.7 (H) 3.8 - 10.6 x1000/?L  RBC 4.4 (L) 4.6 - 6.1 M/?L  Hemoglobin 13.1 (L) 13.7 - 18.0 g/dL  Hematocrit 16.1 09.6 - 54.0 %  MCV 94.5 80.0 - 99.0 fL  MCHC 31.8 (L) 32.0 - 36.0 g/dL  RDW-CV 04.5 40.9 - 81.1 %  Platelets 332 140 - 446 x1000/?L  MPV 10.0 9.8 - 12.3 fL  ANC (Abs Neutrophil Count) 9.0 (H) 1.8 - 7.3 x 1000/?L  Neutrophils 77.0 (H) 38.0 - 74.0 % Lymphocytes 14.8 14.0 - 43.0 %  Absolute Lymphocyte Count 1.7 1.0 - 2.3 x 1000/?L  Monocytes 3.8 0.0 - 14.0 %  Monocyte Absolute Count 0.4 0.4 - 1.3 x 1000/?L  Eosinophils 0.1 0.0 - 6.0 %  Eosinophil Absolute Count 0.0 0.0 - 0.4 x 1000/?L  Basophil 0.4 0.0 - 2.0 %  Basophil Absolute Count 0.1 0.0 - 0.6 x 1000/?L  Immature Granulocytes 3.9 (H) 0.0 - 2.0 %  Absolute Immature Granulocyte Count 0.5 (H) 0.0 - 0.2 x 1000/?L  nRBC 0.0 0.0 - 0.2 %  Absolute nRBC 0.0 <=0.0 x 1000/?L  MCH 30.0 25.7 - 31.0 pg Comprehensive metabolic panel  Collection Time: 06/19/20  6:20 AM Result Value Ref Range  Sodium 143 136 - 145 mmol/L  Potassium 5.0 3.5 - 5.1 mmol/L  Chloride 111 95 - 115 mmol/L  CO2 26 21 - 32 mmol/L  Anion Gap 6 5 - 18  Glucose 186 (H) 70 - 100 mg/dL  BUN 21 8 - 25 mg/dL  Creatinine 9.14 7.82 - 1.30 mg/dL  Calcium 8.6 8.4 - 95.6 mg/dL  BUN/Creatinine Ratio 21.3 8.0 - 25.0  Total Protein 6.4 6.4 - 8.2 g/dL  Albumin 3.0 (L) 3.4 - 5.0 g/dL  Total Bilirubin 0.4 0.0 - 1.0 mg/dL  Alkaline Phosphatase 086 (H) 20 - 135 U/L  Alanine Aminotransferase (ALT) 86 (H) 12 - 78 U/L  Aspartate Aminotransferase (AST) 30 5 - 37 U/L  Globulin 3.4 g/dL  A/G Ratio 0.9   AST/ALT Ratio 0.3 See Comment  eGFR (Afr Amer) >60 >60 mL/min/1.42m2  eGFR (NON African-American) >60 >60 mL/min/1.75m2  Osmolality Calculation 293 275 - 295 mOsm/kg Diagnostics:No new radiology.ECG/Tele Events: No ECG ordered today Assessment Assessment: Assessment:COPD  hypoxic resp failure, resolvedHyperglycemia, on steroidsHx of recent intraarterial pulmonary artery thrombus on coumadin . INR not therapeutic todayOff abx now , no signs of bacterial infection seen by cardiology. Shortness of breath and hypoxia not felt to be cardiac in etiologyI have reviewed the patient's problem list and updated it as needed. Plan Plan: taper steroids to oral prednisone ( if ok with pulm) continue duonbs and symbicortWill bridge with lovenox today as INR only 1.46 Coumadin 5mg  todayMonitor off oxygen while ambulatingHope to dc tomorrow on prednisone taper and follow up with dr leibertElectronically Signed:Leeanne Deed, MD Beeper 12547/23/2021, 10:08 AM

## 2020-06-19 NOTE — Plan of Care
[facility]		Spiritual Care NotePurpose: Referral Source: Chaplain Initiated Observation: People present/Information Obtained From: Patient Emotional Mood: Hopeful   Types of Relational Support: Sibling   Subjective: Pt A & O. Cordial. Polite. His religious preference is Systems analyst. He stated his faith has lapsed during the past several years although he recently became interested in attending a Bible study. He expressed no spiritual concerns or distress. I explored his feelings about his illness and hospital stay. He asked about learning the locations of various churches in the [place] area and which offered Bible studies. He declined the offer of a prayer. I offered a brief blessing. He expressed his gratitude for this morning's visit. During the visit I offered spiritual support, a non-anxious presence, and nonjudgmental supportive listening.Spiritual Assessment:Referral Source: Chaplain Initiated Information Obtained From: Patient Mood: HopefulRelational SupportTypes of Relational Support: Sibling Religious Affiliation: Systems analyst  Spiritual Resources: Faith, Prayer     Issues RaisedRaised by Patient: No Issues Raised   Spiritual Interventions:Spiritual Intervention Index**Date of Spiritual Visit: 07/23/21Visit Type: Initial VisitLanguage or special accommodation rendered?: NoIntervention Type: Spiritual VisitResponding Chaplain: Unit ChaplainSpiritual/Religious Support Provided: Spiritual Support, Introduction to Chaplaincy ServicesOutcome: OUTCOMES: Expressed Spiritual/Religious Resources or Distress: Expressed gratitude   OUTCOMES: Progressed Spiritually: Knowledgeable about services of the Dept. of Spiritual Care, Established chaplain relationship OUTCOMES: Progressed Emotionally or Physically: Increased satisfaction     Plan: I informed the pt he can request another chaplain visit if desired. Follow-up as requested.    	Signed: Rev. Dollye Glasser, Unit Chaplain7/23/202110:55 AMPlan of Care Overview/ Patient Status

## 2020-06-19 NOTE — Plan of Care
Problem: Adult Inpatient Plan of CareGoal: Plan of Care Review7/22/2021 2129 by Jerelyn Charles, RNOutcome: Interventions implemented as appropriateGoal: Patient-Specific Goal (Individualized)06/18/2020 2129 by Jerelyn Charles, RNOutcome: Interventions implemented as appropriateGoal: Absence of Hospital-Acquired Illness or Injury7/22/2021 2129 by Jerelyn Charles, RNOutcome: Interventions implemented as appropriateGoal: Optimal Comfort and Wellbeing7/22/2021 2129 by Jerelyn Charles, RNOutcome: Interventions implemented as appropriateGoal: Readiness for Transition of Care7/22/2021 2129 by Jerelyn Charles, RNOutcome: Interventions implemented as appropriate Plan of Care Overview/ Patient Status    TRANSFER OUT NOTEASSESSMENTNeuro: Cognitive/Neuro/Behavioral WDL: WDL Patient is alert and oriented x4, friendly and cooperative. Cardiac: Cardiac WDL: WDL except NSR while at rest, ST with ambulation.Telemetry: 	[x]  Yes		[]  NoRespiratory:Respiratory WDL: WDL except 1.5L NC, lung sounds diminished throughout, occasional productive cough after neb txt and robitussion, and patient able to expectorate thick, clear mucous independently. Patient refusing Symbicort because, it makes me not right in the head.Gastrointestinal: GI WDL: WDL Abdomen is softly distended, +BS, +flatus, and patient reports normal BM this morning. Genitourinary: Genitourinary WDL: WDL except;voiding ability/characteristics Patient chronically, independently with his own supplies, straight cath's self 3 x a day, and reports clear, yellow urine.Elimination:   [x]  Independent	[]  Commode	[]  Bedpan/Urinal  [x]  Straight Cath		   []  Foley cath 	[]   Urostomy	[]  Colostomy	Other (specify):Skin Alteration: []  Pressure Injury []  Wound [x]  None Ambulation:     []  Independent [x]  Cane   []  Walker	[]  Wheelchair       	               []  Bedbound     []  Hemiplegic []  Paraplegic []  Quadraplegic Dietary Orders (From admission, onward)     Start     Ordered  06/15/20 1814  Diet Cardiac  DIET EFFECTIVE NOW   Question Answer Comment Sodium Restriction: 3 gm NA  Initiate Nutrition Management Protocol (Yes/No?) Yes - Initiate Protocol    06/15/20 1813     Medication Administration Special Directions:  IV solumedrol due at 0200.IV Access: [x]  PIV   []  PICC    []  Port    [] Central line    []  A-line    Other (specify) Left Forearm Outstanding Meds/Treatments/Tests: PRN pain medication taken Q6 hours: Tylenol and Dilaudid. Last given 2000.Safety Precautions: []  None	[]  Sitter   []  Restraints	[]  Suicidal                      	  [x]  Fall Risk	 Other (specify): Due to O2 tubing.TRANSFER INFORMATION Patient Belongings: Cell phone, books, and clothes.Are the belongings documented?          []  No	    [x]  YesIs someone taking belongings home?   [x]  No     []  Yes  Who? (specify) _________Transferred to [facility]-D Med/Onc as per M.D. Equipment/Device: Oxygen tankEscorted by: TransportVitals:  06/18/20 1431 06/18/20 1613 06/18/20 2000 06/18/20 2011 BP:  (!) 145/79 (!) 156/81  Pulse:  85 85  Resp:  19 18  Temp:  97.4 ?F (36.3 ?C) 98 ?F (36.7 ?C)  TempSrc:  Oral Oral  SpO2: 95% 95% 96% 94% Weight:     Height:      Verbal report given to [employer].N.Report considered of patient's Situation, Background, Assessment, and RecommendationOpportunity for questions and clarification was providedRN and Contact number/MHB #:   Asher Muir [phone]                                          	Problem: InfectionGoal: Infection  Symptom Resolution7/22/2021 2129 by Jerelyn Charles, RNOutcome: Interventions implemented as appropriate Problem: Fall Injury RiskGoal: Absence of Fall and Fall-Related Injury7/22/2021 2129 by Jerelyn Charles, RNOutcome: Interventions implemented as appropriate Problem: Adjustment to Illness COPD (Chronic Obstructive Pulmonary Disease)Goal: Optimal Chronic Illness Coping7/22/2021 2129 by Jerelyn Charles, RNOutcome: Interventions implemented as appropriate Problem: Functional Ability Impaired COPD (Chronic Obstructive Pulmonary Disease)Goal: Optimal Level of Functional Independence7/22/2021 2129 by Jerelyn Charles, RNOutcome: Interventions implemented as appropriate Problem: Infection COPD (Chronic Obstructive Pulmonary Disease)Goal: Absence of Infection Signs and Symptoms7/22/2021 2129 by Jerelyn Charles, RNOutcome: Interventions implemented as appropriate Problem: Oral Intake Inadequate COPD (Chronic Obstructive Pulmonary Disease)Goal: Improved Nutrition Intake7/22/2021 2129 by Jerelyn Charles, RNOutcome: Interventions implemented as appropriateProblem: Respiratory Compromise COPD (Chronic Obstructive Pulmonary Disease)Goal: Effective Oxygenation and Ventilation7/22/2021 2129 by Jerelyn Charles, RNOutcome: Interventions implemented as appropriate

## 2020-06-19 NOTE — Plan of Care
Plan of Care Overview/ Patient Status    NUTRITION INITIAL ASSESSMENT:Pt admitted with SOB and headache. Was recently admitted with COPD exacerbation last month. 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 Met with pt who reports he has been tolerating po intake at home well since dc. Reports he enjoys a high salt diet, but is aware this will need to change. Pt reports he is willing to make needed changes. Also discussed coumadin/vit k interactions with pt and provided with contact info for additional questions. PMHx: COPD, GERD, PE, PNA, HTN, cricopharyngeal dysfunctionDiet history: regularWeight history: 90.7 kg (07/2019) to 114 kg this admissionFood allergies: NKFA'sMedications: symbicort, pepcid, duoneb, crestor, solumedrol, k-dur, lyrica, senokot, coumadinLabs: Glucose 182, chol 298, LDL 228Skin: no PI, 1+ edema to b/l LEEBowels: BM documented 7/22Nutrition Needs Estimation: 1850-2325 kcal (20-25 kcal/kg ABW), 95-110 gm protein (1-1.2 gm/kg ABW) PES STATEMENT:Nutrition Diagnosis/Problem: Food and nutrition-related knowledge deficitRelated to: cardiac diet and coumadin/vit k interactionsAs Evidenced By: new diet and medication this admission.RECOMMENDATIONS:Continue cardiac diet as ordered. Refer to outpt RD for ongoing assistance. Weekly weight. INTERVENTIONS:Case reviewed. Reinforced prior cardiac diet education and answered questions. Provided NCM handout on coumadin/vit k interactions and reviewed same. RD to follow clinical course. MONITORING / EVALUATION:Skin integrity - Goal: intactWeight - Goal: stable during admissionPO intake - Goal: >/= 75% energy needs via meals/supplementsRD will follow clinical course per protocol and adjust nutrition plan as appropriateCheryl Mayford Knife, MS RD CNSC

## 2020-06-19 NOTE — Plan of Care
Problem: Adult Inpatient Plan of CareGoal: Plan of Care ReviewOutcome: Interventions implemented as appropriateGoal: Patient-Specific Goal (Individualized)Outcome: Interventions implemented as appropriateGoal: Absence of Hospital-Acquired Illness or InjuryOutcome: Interventions implemented as appropriateGoal: Optimal Comfort and WellbeingOutcome: Interventions implemented as appropriateGoal: Readiness for Transition of CareOutcome: Interventions implemented as appropriate Problem: InfectionGoal: Infection Symptom ResolutionOutcome: Interventions implemented as appropriate Problem: Fall Injury RiskGoal: Absence of Fall and Fall-Related InjuryOutcome: Interventions implemented as appropriate Problem: Physical Therapy GoalsGoal: Physical Therapy GoalsDescription: PT GOALS1. Patient will perform bed mobility independently2. Patient will perform transfers with least assistive device with modified independence3. Patient will ambulate a minimum of 150 feet with least assistive device with modified independence4. Patient will ascend and descend at least 5 steps with modified independenceOutcome: Interventions implemented as appropriate Problem: Functional Ability Impaired COPD (Chronic Obstructive Pulmonary Disease)Goal: Optimal Level of Functional IndependenceOutcome: Interventions implemented as appropriate Problem: Infection COPD (Chronic Obstructive Pulmonary Disease)Goal: Absence of Infection Signs and SymptomsOutcome: Interventions implemented as appropriate Problem: Oral Intake Inadequate COPD (Chronic Obstructive Pulmonary Disease)Goal: Improved Nutrition IntakeOutcome: Interventions implemented as appropriate Problem: Respiratory Compromise COPD (Chronic Obstructive Pulmonary Disease)Goal: Effective Oxygenation and VentilationOutcome: Interventions implemented as appropriate Plan of Care Overview/ Patient Status Received patient alert and oriented. Vs recorded. RA. C/o chronic back pain. Dilaudid 4mg  po given prn. And tylenol given for headache. Due meds and meals tolerated. Steroids & duonebs continued. OOB with cane. O2 90% upon ambulation.Anticipate d/c tomorrow. Will continue to monitor. Safety maintained, call bell within reach.

## 2020-06-19 NOTE — Progress Notes
Pulmonary Follow-upFeels better.Breathing better.Off O2BP (!) 154/84  - Pulse 78  - Temp 98.1 ?F (36.7 ?C) (Oral)  - Resp 20  - Ht 6' (1.829 m)  - Wt 114 kg  - SpO2 96%  - BMI 34.09 kg/m? Alert and engaged.No prolongation of the expiratory phase.Coarse breath sounds but no distinct wheezes rales or rhonchiHeart is regularNo edema.Recent Labs   07/21/210443 07/22/210421 07/23/210620 NA 142 142 143 K 4.1 4.3 5.0 CL 107 108 111 CO2 31 27 26  BUN 22 24 21  CREATININE 0.78 0.78 0.93 GLU 173* 189* 186* Recent Labs   07/21/210443 07/22/210421 07/23/210620 WBC 14.2* 12.9* 11.7* HGB 12.5* 13.0* 13.1* PLT 333 328 332  Recent Labs   07/21/210442 07/22/210421 07/23/210620 INR 2.64* 1.98* 1.46* ACOPDBronchospastic exacerbation.Paranoia on MDI's. 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 (Or Combivent contains same chemicals.)And perhaps try Levalbuterol as the ?rescue? inhalerWarfarinOutpatient [place] when he is well to document resolution of the ground-glass area.Pulmonary will sign off for now. Please recall [place] if we can be of further assistance.Lamoine Magallon Leibert7/23/2021 11:25 AMBeeper [phone], Extension [phone] [phone] for any questions

## 2020-06-20 ENCOUNTER — Encounter: Admit: 2020-06-20 | Payer: PRIVATE HEALTH INSURANCE | Attending: Internal Medicine | Primary: Family Medicine

## 2020-06-20 DIAGNOSIS — Z7951 Long term (current) use of inhaled steroids: Secondary | ICD-10-CM

## 2020-06-20 DIAGNOSIS — Z9114 Patient's other noncompliance with medication regimen: Secondary | ICD-10-CM

## 2020-06-20 DIAGNOSIS — Z86711 Personal history of pulmonary embolism: Secondary | ICD-10-CM

## 2020-06-20 DIAGNOSIS — Z6834 Body mass index (BMI) 34.0-34.9, adult: Secondary | ICD-10-CM

## 2020-06-20 DIAGNOSIS — Z20822 Contact with and (suspected) exposure to covid-19: Secondary | ICD-10-CM

## 2020-06-20 DIAGNOSIS — Z952 Presence of prosthetic heart valve: Secondary | ICD-10-CM

## 2020-06-20 DIAGNOSIS — Z950 Presence of cardiac pacemaker: Secondary | ICD-10-CM

## 2020-06-20 DIAGNOSIS — Z7901 Long term (current) use of anticoagulants: Secondary | ICD-10-CM

## 2020-06-20 DIAGNOSIS — Z9119 Patient's noncompliance with other medical treatment and regimen: Secondary | ICD-10-CM

## 2020-06-20 DIAGNOSIS — J441 Chronic obstructive pulmonary disease with (acute) exacerbation: Secondary | ICD-10-CM

## 2020-06-20 DIAGNOSIS — K219 Gastro-esophageal reflux disease without esophagitis: Secondary | ICD-10-CM

## 2020-06-20 DIAGNOSIS — E7800 Pure hypercholesterolemia, unspecified: Secondary | ICD-10-CM

## 2020-06-20 DIAGNOSIS — E873 Alkalosis: Secondary | ICD-10-CM

## 2020-06-20 DIAGNOSIS — Z87891 Personal history of nicotine dependence: Secondary | ICD-10-CM

## 2020-06-20 DIAGNOSIS — N319 Neuromuscular dysfunction of bladder, unspecified: Secondary | ICD-10-CM

## 2020-06-20 DIAGNOSIS — Z8711 Personal history of peptic ulcer disease: Secondary | ICD-10-CM

## 2020-06-20 DIAGNOSIS — G8929 Other chronic pain: Secondary | ICD-10-CM

## 2020-06-20 DIAGNOSIS — N400 Enlarged prostate without lower urinary tract symptoms: Secondary | ICD-10-CM

## 2020-06-20 DIAGNOSIS — E669 Obesity, unspecified: Secondary | ICD-10-CM

## 2020-06-20 DIAGNOSIS — J9691 Respiratory failure, unspecified with hypoxia: Secondary | ICD-10-CM

## 2020-06-20 DIAGNOSIS — Z85828 Personal history of other malignant neoplasm of skin: Secondary | ICD-10-CM

## 2020-06-20 DIAGNOSIS — Z79899 Other long term (current) drug therapy: Secondary | ICD-10-CM

## 2020-06-20 DIAGNOSIS — I10 Essential (primary) hypertension: Secondary | ICD-10-CM

## 2020-06-20 LAB — BASIC METABOLIC PANEL
BKR ANION GAP: 2 — ABNORMAL LOW (ref 5–18)
BKR BLOOD UREA NITROGEN: 21 mg/dL (ref 8–25)
BKR BUN / CREAT RATIO: 24.4 % (ref 8.0–25.0)
BKR CALCIUM: 9.2 mg/dL (ref 8.4–10.3)
BKR CHLORIDE: 109 mmol/L (ref 95–115)
BKR CO2: 29 mmol/L — ABNORMAL LOW (ref 21–32)
BKR CREATININE: 0.86 mg/dL — ABNORMAL HIGH (ref 0.50–1.30)
BKR EGFR (AFR AMER): >60 mL/min/1.73m2 (ref >60)
BKR EGFR (NON AFRICAN AMERICAN): >60 mL/min/{1.73_m2} (ref >60)
BKR OSMOLALITY CALCULATION: 284 mosm/kg (ref 275–295)
BKR POTASSIUM: 4.4 mmol/L (ref 3.5–5.1)
BKR SODIUM: 140 mmol/L (ref 136–145)
BKR WAM HEMOGLOBIN: 140 mmol/L — ABNORMAL LOW (ref 136–145)
BKR WAM MCV: 109 mmol/L (ref 95–115)

## 2020-06-20 LAB — CBC WITH AUTO DIFFERENTIAL
BKR GLUCOSE: 334 x1000/??L — ABNORMAL HIGH (ref 140–446)
BKR WAM ABSOLUTE IMMATURE GRANULOCYTES: 0.6 x 1000/??L — ABNORMAL HIGH (ref 0.0–0.2)
BKR WAM ABSOLUTE LYMPHOCYTE COUNT: 3.1 x 1000/??L — ABNORMAL HIGH (ref 1.0–2.3)
BKR WAM ABSOLUTE NRBC: 0 x 1000/??L (ref <=0.0)
BKR WAM ANALYZER ANC: 7.4 x 1000/ÂµL — ABNORMAL HIGH (ref 1.8–7.3)
BKR WAM BASOPHIL ABSOLUTE COUNT: 0.1 x 1000/??L (ref 0.0–0.6)
BKR WAM BASOPHILS: 0.6 % (ref 0.0–2.0)
BKR WAM EOSINOPHIL ABSOLUTE COUNT: 0 x 1000/??L (ref 0.0–0.4)
BKR WAM EOSINOPHILS: 0.3 % (ref 0.0–6.0)
BKR WAM HEMATOCRIT: 41.5 % (ref 41.0–54.0)
BKR WAM IMMATURE GRANULOCYTES: 5.3 % — ABNORMAL HIGH (ref 0.0–2.0)
BKR WAM LYMPHOCYTES: 26.4 % (ref 14.0–43.0)
BKR WAM MCH (PG): 30 pg (ref 25.7–31.0)
BKR WAM MCHC: 31.8 g/dL — ABNORMAL LOW (ref 32.0–36.0)
BKR WAM MONOCYTE ABSOLUTE COUNT: 0.6 x 1000/??L (ref 0.4–1.3)
BKR WAM MONOCYTES: 4.7 % (ref 0.0–14.0)
BKR WAM MPV: 10.1 fL (ref 9.8–12.3)
BKR WAM NEUTROPHILS: 62.7 % (ref 38.0–74.0)
BKR WAM NUCLEATED RED BLOOD CELLS: 0.3 % — ABNORMAL HIGH (ref 0.0–0.2)
BKR WAM PLATELETS: 334 x1000/ÂµL (ref 140–446)
BKR WAM RDW-CV: 14 % — ABNORMAL LOW (ref 11.5–14.5)
BKR WAM RED BLOOD CELL COUNT: 4.4 M/??L — ABNORMAL LOW (ref 4.6–6.1)
BKR WAM WHITE BLOOD CELL COUNT: 11.8 x1000/??L — ABNORMAL HIGH (ref 3.8–10.6)

## 2020-06-20 LAB — BLOOD CULTURE
BKR BLOOD CULTURE: NO GROWTH
BKR BLOOD CULTURE: NO GROWTH mg/dL — ABNORMAL HIGH (ref 8–25)

## 2020-06-20 LAB — PROTIME AND INR: BKR PROTHROMBIN TIME: 13 s — ABNORMAL HIGH (ref 9.4–12.0)

## 2020-06-20 MED ORDER — WARFARIN 5 MG TABLET
5 mg | ORAL_TABLET | 1 refills | Status: AC
Start: 2020-06-20 — End: 2020-06-24

## 2020-06-20 MED ORDER — LEVALBUTEROL HFA 45 MCG/ACTUATION AEROSOL INHALER
45 mcg/actuation | RESPIRATORY_TRACT | 1 refills | Status: AC | PRN
Start: 2020-06-20 — End: 2020-11-18

## 2020-06-20 MED ORDER — IPRATROPIUM 0.5 MG-ALBUTEROL 3 MG (2.5 MG BASE)/3 ML NEBULIZATION SOLN
0.5 mg-3 mg(2.5 mg base)/3 mL | Freq: Four times a day (QID) | RESPIRATORY_TRACT | 1 refills | Status: AC | PRN
Start: 2020-06-20 — End: 2021-07-30

## 2020-06-20 MED ORDER — PREDNISONE 10 MG TABLET
10 mg | ORAL_TABLET | ORAL | 1 refills | Status: SS
Start: 2020-06-20 — End: 2020-07-05

## 2020-06-20 MED ORDER — ENOXAPARIN 120 MG/0.8 ML SUBCUTANEOUS SYRINGE
1200.8 mg/0.8 mL | Freq: Two times a day (BID) | SUBCUTANEOUS | Status: DC
Start: 2020-06-20 — End: 2020-06-20

## 2020-06-20 MED ORDER — POTASSIUM CHLORIDE ER 10 MEQ TABLET,EXTENDED RELEASE(PART/CRYST)
10 MEQ | ORAL_TABLET | Freq: Every day | ORAL | 1 refills | Status: AC
Start: 2020-06-20 — End: 2021-05-04

## 2020-06-20 MED ORDER — ROSUVASTATIN 40 MG TABLET
40 mg | ORAL_TABLET | Freq: Every evening | ORAL | 1 refills | Status: AC
Start: 2020-06-20 — End: 2020-06-24

## 2020-06-20 MED ORDER — ENOXAPARIN 120 MG/0.8 ML SUBCUTANEOUS SYRINGE
120 mg/0.8 mL | INJECTION | Freq: Two times a day (BID) | SUBCUTANEOUS | 1 refills | Status: AC
Start: 2020-06-20 — End: 2020-06-23

## 2020-06-20 MED ORDER — WARFARIN 2.5 MG TABLET
2.5 mg | Freq: Once | ORAL | Status: DC
Start: 2020-06-20 — End: 2020-06-20

## 2020-06-20 MED ORDER — FAMOTIDINE 20 MG TABLET
20 mg | ORAL_TABLET | Freq: Two times a day (BID) | ORAL | 1 refills | Status: SS
Start: 2020-06-20 — End: 2020-07-05

## 2020-06-20 NOTE — Discharge Instructions
You were admitted for a COPD exacerbation. You were given steroids, nebulizers and antibiotics and oxygen and your symptoms improved. You were also anticoagulated for a PE (which propagated on eliquis so you were transitioned to warfarin) while you were here. Your INR is subtherapeutic. You want to go home so I prescribed lovenox injections for you to take every 12 hours. Please continue 5mg  of coumadin nightly. You MUST FOLLOW UP WITH DR. Nedra Hai on Monday FOR BLOODWORK (INR). If you are unable to follow with him please call your primary care doctor. You were also prescribed steroids (see taper in med rec) as well as duo-nebs and a levoalbuterol inahler for rescue. Please follow up with Dr. Billie Ruddy in two weeks. Anticoagulation has risks- which include bleeding. If you have any trauma (especially to your head) you MUST seek professional medical care immediately. See attached sheet for more information. As always if your symptoms worsen or you develop new symptoms please seek professional medical care. Best wishes!Dr Alona Bene

## 2020-06-20 NOTE — Plan of Care
Plan of Care Overview/ Patient Status    Received patient alert, orient ad libRTM in place NSR O2 monitor , 2L of O2 via [employer] Stats  on a ambulation 90%-92%Safety maintainedCall bell in reach
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
## 2020-06-20 NOTE — Plan of Care
Problem: Adult Inpatient Plan of CareGoal: Plan of Care ReviewOutcome: Interventions implemented as appropriateGoal: Patient-Specific Goal (Individualized)Outcome: Interventions implemented as appropriateGoal: Absence of Hospital-Acquired Illness or InjuryOutcome: Interventions implemented as appropriateGoal: Optimal Comfort and WellbeingOutcome: Interventions implemented as appropriateGoal: Readiness for Transition of CareOutcome: Interventions implemented as appropriate Problem: InfectionGoal: Infection Symptom ResolutionOutcome: Interventions implemented as appropriate Problem: Fall Injury RiskGoal: Absence of Fall and Fall-Related InjuryOutcome: Interventions implemented as appropriate Problem: Physical Therapy GoalsGoal: Physical Therapy GoalsDescription: PT GOALS1. Patient will perform bed mobility independently2. Patient will perform transfers with least assistive device with modified independence3. Patient will ambulate a minimum of 150 feet with least assistive device with modified independence4. Patient will ascend and descend at least 5 steps with modified independenceOutcome: Interventions implemented as appropriate Problem: Adjustment to Illness COPD (Chronic Obstructive Pulmonary Disease)Goal: Optimal Chronic Illness CopingOutcome: Interventions implemented as appropriate Problem: Functional Ability Impaired COPD (Chronic Obstructive Pulmonary Disease)Goal: Optimal Level of Functional IndependenceOutcome: Interventions implemented as appropriate Problem: Infection COPD (Chronic Obstructive Pulmonary Disease)Goal: Absence of Infection Signs and SymptomsOutcome: Interventions implemented as appropriate Problem: Oral Intake Inadequate COPD (Chronic Obstructive Pulmonary Disease)Goal: Improved Nutrition IntakeOutcome: Interventions implemented as appropriate Problem: Respiratory Compromise COPD (Chronic Obstructive Pulmonary Disease)Goal: Effective Oxygenation and VentilationOutcome: Interventions implemented as appropriate Plan of Care Overview/ Patient Status    [name] was discharged via Private Car accompanied by Alone.  Verbalized understanding of discharge instructionsand recommended follow up care as per the after visit summary.  Written discharge instructions provided. Denies any further questions. Vital signs    Vitals:  06/19/20 1945 06/19/20 2142 06/20/20 0750 06/20/20 0831 BP: (!) 143/82  (!) 168/91  Pulse: 84  75  Resp: 20  20  Temp: 98.4 ?F (36.9 ?C)  97.7 ?F (36.5 ?C)  TempSrc: Oral  Oral  SpO2: 94% 95% 95% 95% Weight:     Height:     Assumed care of pt at 1200, pt A&Ox4, VSS.Pt denies pain at this time. Breath sounds diminished bilaterally, denies SOB at this time, on room air. S1 and S2 audible, RTM in place, denies chest pain at this time. Bowel sounds audible and active, abd soft non-tender. Pt OOB independent with a caneSkin intact. PIV to pt's L arm.  Call bell within reach, safety maintained. Wctm. 1300: Discharge order placed, pt verbalized understanding. Education and demonstration of lovenox injections given to pt, verbalized understanding, states I know how to do it, I have done this before.  All belongings taken with pt upon discharge. L PIV removed. RTM d/c'd. Pt assisted to ED to take is own car home at 1335.

## 2020-06-20 NOTE — Plan of Care
Problem: Adult Inpatient Plan of CareGoal: Plan of Care ReviewOutcome: Interventions implemented as appropriateGoal: Patient-Specific Goal (Individualized)Outcome: Interventions implemented as appropriateGoal: Absence of Hospital-Acquired Illness or InjuryOutcome: Interventions implemented as appropriateGoal: Optimal Comfort and WellbeingOutcome: Interventions implemented as appropriateGoal: Readiness for Transition of CareOutcome: Interventions implemented as appropriate Problem: InfectionGoal: Infection Symptom ResolutionOutcome: Interventions implemented as appropriate Problem: Fall Injury RiskGoal: Absence of Fall and Fall-Related InjuryOutcome: Interventions implemented as appropriate Problem: Physical Therapy GoalsGoal: Physical Therapy GoalsDescription: PT GOALS1. Patient will perform bed mobility independently2. Patient will perform transfers with least assistive device with modified independence3. Patient will ambulate a minimum of 150 feet with least assistive device with modified independence4. Patient will ascend and descend at least 5 steps with modified independenceOutcome: Interventions implemented as appropriate Problem: Adjustment to Illness COPD (Chronic Obstructive Pulmonary Disease)Goal: Optimal Chronic Illness CopingOutcome: Interventions implemented as appropriate Problem: Functional Ability Impaired COPD (Chronic Obstructive Pulmonary Disease)Goal: Optimal Level of Functional IndependenceOutcome: Interventions implemented as appropriate Problem: Infection COPD (Chronic Obstructive Pulmonary Disease)Goal: Absence of Infection Signs and SymptomsOutcome: Interventions implemented as appropriate Problem: Oral Intake Inadequate COPD (Chronic Obstructive Pulmonary Disease)Goal: Improved Nutrition IntakeOutcome: Interventions implemented as appropriate Problem: Respiratory Compromise COPD (Chronic Obstructive Pulmonary Disease)Goal: Effective Oxygenation and VentilationOutcome: Interventions implemented as appropriate Plan of Care Overview/ Patient Status    Received patient alert and oriented. Vs recorded. RA. C/o chronic back pain; dilaudid 4mg  po given as needed. Due meds and meals tolerated/ Pt stable for d/c.Safety maintained.

## 2020-06-22 ENCOUNTER — Telehealth: Admit: 2020-06-22 | Payer: PRIVATE HEALTH INSURANCE | Primary: Family Medicine

## 2020-06-22 ENCOUNTER — Inpatient Hospital Stay: Admit: 2020-06-22 | Discharge: 2020-06-22 | Payer: MEDICARE | Primary: Family Medicine

## 2020-06-22 DIAGNOSIS — I2699 Other pulmonary embolism without acute cor pulmonale: Secondary | ICD-10-CM

## 2020-06-22 LAB — POCT INR     (GH): BKR INTERNATIONAL NORMALIZATION RATIO, POC: 1.1

## 2020-06-22 NOTE — Telephone Encounter
Pt informed to continue lovenox and warfarin and come in tomorrow to see our nurse practitioner.  He will hold off on taking injection tomorrow until INR is done.----- Message from Isaias Cowman, MD sent at 06/22/2020  9:28 AM EDT -----Regarding: RE: Follow up From ER VisitPlease have him make an appointment to see Judeth Cornfield either today or tomorrow.  Continue the current dose of Lovenox and warfarin until we can repeat the INR----- Message -----From: Edd Arbour, RNSent: 06/22/2020   8:42 AM EDTTo: Isaias Cowman, MDSubject: Follow up From ER Visit                      In ER over the weekend, SOB x five days, hadn't been feeling well since last discharge.They put him on Lovenox to bridge to warfarin came in with a subtherapeutic INRhe claims hes been on the coumadin for three days, his INR is only 1.1 today.On the 13th instructed to continue taking 5mg  coumadin and return on the the 20th for a repeat INR and if therapeutic we would move to two week interval.INR on 7/13     2.0.He goes to the ER on the 19th for exaberabated COPD sx; INR as follows:  7/19        2.1                          7/20        2.0                          7/21        2.6                                 according to med list ? Got warfarin                          7/22        1.9                          7/23        1.4                          7/24        1.2                                 discharged; got warfarinInstructed to follow up here this morning for INR check and hes 1.1.  As of today he states he is doing lovenox 120mg  BID and coumadin 5mg  in the evening.Please advise.

## 2020-06-23 ENCOUNTER — Encounter: Admit: 2020-06-23 | Payer: PRIVATE HEALTH INSURANCE | Attending: Adult Health | Primary: Family Medicine

## 2020-06-23 ENCOUNTER — Inpatient Hospital Stay: Admit: 2020-06-23 | Discharge: 2020-06-23 | Payer: MEDICARE | Primary: Family Medicine

## 2020-06-23 ENCOUNTER — Ambulatory Visit: Admit: 2020-06-23 | Payer: MEDICARE | Attending: Adult Health | Primary: Family Medicine

## 2020-06-23 DIAGNOSIS — S060XAA Concussion: Secondary | ICD-10-CM

## 2020-06-23 DIAGNOSIS — I2699 Other pulmonary embolism without acute cor pulmonale: Secondary | ICD-10-CM

## 2020-06-23 DIAGNOSIS — R3989 Other symptoms and signs involving the genitourinary system: Secondary | ICD-10-CM

## 2020-06-23 DIAGNOSIS — N319 Neuromuscular dysfunction of bladder, unspecified: Secondary | ICD-10-CM

## 2020-06-23 DIAGNOSIS — A419 Sepsis, unspecified organism: Secondary | ICD-10-CM

## 2020-06-23 DIAGNOSIS — R092 Respiratory arrest: Secondary | ICD-10-CM

## 2020-06-23 DIAGNOSIS — Z5189 Encounter for other specified aftercare: Secondary | ICD-10-CM

## 2020-06-23 DIAGNOSIS — N390 Urinary tract infection, site not specified: Secondary | ICD-10-CM

## 2020-06-23 DIAGNOSIS — M720 Palmar fascial fibromatosis [Dupuytren]: Secondary | ICD-10-CM

## 2020-06-23 DIAGNOSIS — I10 Essential (primary) hypertension: Secondary | ICD-10-CM

## 2020-06-23 DIAGNOSIS — C4491 Basal cell carcinoma of skin, unspecified: Secondary | ICD-10-CM

## 2020-06-23 DIAGNOSIS — R319 Hematuria, unspecified: Secondary | ICD-10-CM

## 2020-06-23 DIAGNOSIS — M1990 Unspecified osteoarthritis, unspecified site: Secondary | ICD-10-CM

## 2020-06-23 DIAGNOSIS — K760 Fatty (change of) liver, not elsewhere classified: Secondary | ICD-10-CM

## 2020-06-23 DIAGNOSIS — G43909 Migraine, unspecified, not intractable, without status migrainosus: Secondary | ICD-10-CM

## 2020-06-23 DIAGNOSIS — Z7901 Long term (current) use of anticoagulants: Secondary | ICD-10-CM

## 2020-06-23 DIAGNOSIS — J449 Chronic obstructive pulmonary disease, unspecified: Secondary | ICD-10-CM

## 2020-06-23 DIAGNOSIS — K219 Gastro-esophageal reflux disease without esophagitis: Secondary | ICD-10-CM

## 2020-06-23 DIAGNOSIS — N23 Unspecified renal colic: Secondary | ICD-10-CM

## 2020-06-23 DIAGNOSIS — N400 Enlarged prostate without lower urinary tract symptoms: Secondary | ICD-10-CM

## 2020-06-23 DIAGNOSIS — E7800 Pure hypercholesterolemia, unspecified: Secondary | ICD-10-CM

## 2020-06-23 DIAGNOSIS — Z8711 Personal history of peptic ulcer disease: Secondary | ICD-10-CM

## 2020-06-23 LAB — POCT INR     (GH): BKR INTERNATIONAL NORMALIZATION RATIO, POC: 1.4

## 2020-06-23 MED ORDER — ENOXAPARIN 120 MG/0.8 ML SUBCUTANEOUS SYRINGE
120 mg/0.8 mL | INJECTION | Freq: Two times a day (BID) | SUBCUTANEOUS | 1 refills | Status: AC
Start: 2020-06-23 — End: 2020-07-20

## 2020-06-23 MED ORDER — WARFARIN 2 MG TABLET
2 mg | ORAL_TABLET | 1 refills | Status: SS
Start: 2020-06-23 — End: 2020-07-05

## 2020-06-23 NOTE — Telephone Encounter
Please advise if prior authorization for this medication was already taken care of
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
## 2020-06-24 MED ORDER — ROSUVASTATIN 40 MG TABLET
40 mg | ORAL_TABLET | 2 refills | Status: SS
Start: 2020-06-24 — End: 2020-07-05

## 2020-06-24 MED ORDER — WARFARIN 5 MG TABLET
5 mg | ORAL_TABLET | 1 refills | Status: AC
Start: 2020-06-24 — End: 2021-01-01
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
## 2020-06-26 ENCOUNTER — Inpatient Hospital Stay: Admit: 2020-06-26 | Discharge: 2020-06-26 | Payer: MEDICARE | Primary: Family Medicine

## 2020-06-26 ENCOUNTER — Ambulatory Visit: Admit: 2020-06-26 | Payer: MEDICARE | Attending: Adult Health | Primary: Family Medicine

## 2020-06-26 ENCOUNTER — Encounter: Admit: 2020-06-26 | Payer: PRIVATE HEALTH INSURANCE | Attending: Adult Health | Primary: Family Medicine

## 2020-06-26 DIAGNOSIS — G43909 Migraine, unspecified, not intractable, without status migrainosus: Secondary | ICD-10-CM

## 2020-06-26 DIAGNOSIS — C4491 Basal cell carcinoma of skin, unspecified: Secondary | ICD-10-CM

## 2020-06-26 DIAGNOSIS — I2699 Other pulmonary embolism without acute cor pulmonale: Secondary | ICD-10-CM

## 2020-06-26 DIAGNOSIS — R319 Hematuria, unspecified: Secondary | ICD-10-CM

## 2020-06-26 DIAGNOSIS — N319 Neuromuscular dysfunction of bladder, unspecified: Secondary | ICD-10-CM

## 2020-06-26 DIAGNOSIS — Z8711 Personal history of peptic ulcer disease: Secondary | ICD-10-CM

## 2020-06-26 DIAGNOSIS — M1990 Unspecified osteoarthritis, unspecified site: Secondary | ICD-10-CM

## 2020-06-26 DIAGNOSIS — N23 Unspecified renal colic: Secondary | ICD-10-CM

## 2020-06-26 DIAGNOSIS — K760 Fatty (change of) liver, not elsewhere classified: Secondary | ICD-10-CM

## 2020-06-26 DIAGNOSIS — Z7901 Long term (current) use of anticoagulants: Secondary | ICD-10-CM

## 2020-06-26 DIAGNOSIS — N400 Enlarged prostate without lower urinary tract symptoms: Secondary | ICD-10-CM

## 2020-06-26 DIAGNOSIS — R092 Respiratory arrest: Secondary | ICD-10-CM

## 2020-06-26 DIAGNOSIS — N390 Urinary tract infection, site not specified: Secondary | ICD-10-CM

## 2020-06-26 DIAGNOSIS — S060XAA Concussion: Secondary | ICD-10-CM

## 2020-06-26 DIAGNOSIS — J449 Chronic obstructive pulmonary disease, unspecified: Secondary | ICD-10-CM

## 2020-06-26 DIAGNOSIS — Z5189 Encounter for other specified aftercare: Secondary | ICD-10-CM

## 2020-06-26 DIAGNOSIS — I10 Essential (primary) hypertension: Secondary | ICD-10-CM

## 2020-06-26 DIAGNOSIS — E7800 Pure hypercholesterolemia, unspecified: Secondary | ICD-10-CM

## 2020-06-26 DIAGNOSIS — M720 Palmar fascial fibromatosis [Dupuytren]: Secondary | ICD-10-CM

## 2020-06-26 DIAGNOSIS — K219 Gastro-esophageal reflux disease without esophagitis: Secondary | ICD-10-CM

## 2020-06-26 DIAGNOSIS — R3989 Other symptoms and signs involving the genitourinary system: Secondary | ICD-10-CM

## 2020-06-26 DIAGNOSIS — A419 Sepsis, unspecified organism: Secondary | ICD-10-CM

## 2020-06-26 LAB — POCT INR     (GH): BKR INTERNATIONAL NORMALIZATION RATIO, POC: 1.9

## 2020-06-26 NOTE — Patient Instructions
July  2021 Sunday Monday Tuesday Wednesday Thursday Friday Saturday  25 26 27  28 29 30   5  mg 31  7 mg   August 2021  Sunday Monday Tuesday Wednesday Thursday Friday Saturday  1    5 mg  2    5 mg  3    7 mg  4  5 mg  5Return for INR and follow up with Judeth Cornfield  6 7 8 9 10 11 12 13 14 15 16 17 18 19 20 21 22 23 24 25 26 27 28 29 30  31
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
## 2020-07-02 ENCOUNTER — Ambulatory Visit: Admit: 2020-07-02 | Payer: PRIVATE HEALTH INSURANCE | Primary: Family Medicine

## 2020-07-02 ENCOUNTER — Emergency Department: Admit: 2020-07-02 | Payer: PRIVATE HEALTH INSURANCE | Primary: Family Medicine

## 2020-07-02 ENCOUNTER — Ambulatory Visit: Admit: 2020-07-02 | Payer: MEDICARE | Attending: Adult Health | Primary: Family Medicine

## 2020-07-02 ENCOUNTER — Inpatient Hospital Stay: Admit: 2020-07-02 | Discharge: 2020-07-05 | Payer: MEDICARE | Source: Home / Self Care | Admitting: Internal Medicine

## 2020-07-02 DIAGNOSIS — J189 Pneumonia, unspecified organism: Secondary | ICD-10-CM

## 2020-07-02 LAB — COMPREHENSIVE METABOLIC PANEL
BKR A/G RATIO: 1.2
BKR ALANINE AMINOTRANSFERASE (ALT): 108 U/L — ABNORMAL HIGH (ref 12–78)
BKR ALBUMIN: 3.5 g/dL (ref 3.4–5.0)
BKR ALKALINE PHOSPHATASE: 163 U/L — ABNORMAL HIGH (ref 20–135)
BKR ANION GAP: 7 (ref 5–18)
BKR ASPARTATE AMINOTRANSFERASE (AST): 32 U/L (ref 5–37)
BKR AST/ALT RATIO: 0.3
BKR BILIRUBIN TOTAL: 0.5 mg/dL (ref 0.0–1.0)
BKR BLOOD UREA NITROGEN: 20 mg/dL (ref 8–25)
BKR BLOOD, UA: 32 U/L (ref 5–37)
BKR BUN / CREAT RATIO: 22.5 (ref 8.0–25.0)
BKR CALCIUM: 8.5 mg/dL (ref 8.4–10.3)
BKR CHLORIDE: 104 mmol/L (ref 95–115)
BKR CO2: 30 mmol/L (ref 21–32)
BKR CREATININE: 0.89 mg/dL (ref 0.50–1.30)
BKR EGFR (NON AFRICAN AMERICAN): >60 mL/min/1.73m2 (ref >60)
BKR GLOBULIN: 3 g/dL (ref 0.0–0.6)
BKR GLUCOSE: 123 mg/dL — ABNORMAL HIGH (ref 70–100)
BKR OSMOLALITY CALCULATION: 285 mOsm/kg (ref 275–295)
BKR POTASSIUM: 3.4 mmol/L — ABNORMAL LOW (ref 3.5–5.1)
BKR PROTEIN TOTAL: 6.5 g/dL (ref 6.4–8.2)
BKR SARS-COV-2 RNA (COVID-19) (YH): 20 mg/dL (ref 8–25)
BKR SODIUM: 141 mmol/L (ref 136–145)
BKR WAM ANALYZER ANC: 0.89 mg/dL — ABNORMAL HIGH (ref 0.50–1.30)

## 2020-07-02 LAB — BLOOD GAS, ARTERIAL     (GH)
BKR BASE EXCESS ARTERIAL: 6 mmol/L — ABNORMAL HIGH (ref ?–2)
BKR CARBOXYHEMOGLOBIN, ARTERIAL: 1.1 % (ref 0.5–1.5)
BKR HCO3, ARTERIAL: 30.1 mmol/L — ABNORMAL HIGH (ref 20.0–26.0)
BKR METHEMOGLOBIN, ARTERIAL: 0.5 % (ref 0.0–1.5)
BKR O2 SATURATION, ARTERIAL: 93 % — ABNORMAL LOW (ref 95–97)
BKR PATIENT TEMP: 37 Cel
BKR PCO2 ARTERIAL: 44 mmHg (ref 35–45)
BKR PH, ARTERIAL: 7.45 U (ref 7.35–7.45)
BKR PO2, ARTERIAL: 72 mmHg — ABNORMAL LOW (ref 75–100)

## 2020-07-02 LAB — CBC WITH AUTO DIFFERENTIAL
BKR EGFR (AFR AMER): 0 % (ref 0.0–0.2)
BKR WAM ABSOLUTE IMMATURE GRANULOCYTES: 0.1 x 1000/??L (ref 0.0–0.2)
BKR WAM ABSOLUTE LYMPHOCYTE COUNT: 1.3 x 1000/ÂµL (ref 1.0–2.3)
BKR WAM ABSOLUTE NRBC: 0 x 1000/ÂµL (ref <=0.0)
BKR WAM BASOPHIL ABSOLUTE COUNT: 0 x 1000/??L (ref 0.0–0.6)
BKR WAM BASOPHILS: 0.2 % (ref 0.0–2.0)
BKR WAM EOSINOPHIL ABSOLUTE COUNT: 0.1 x 1000/??L — ABNORMAL HIGH (ref 0.0–0.4)
BKR WAM EOSINOPHILS: 0.5 % — AB (ref 0.0–6.0)
BKR WAM HEMATOCRIT: 42.1 % (ref 41.0–54.0)
BKR WAM HEMOGLOBIN: 13.7 g/dL (ref 13.7–18.0)
BKR WAM IMMATURE GRANULOCYTES: 0.5 % (ref 0.0–2.0)
BKR WAM LYMPHOCYTES: 7.7 % — ABNORMAL LOW (ref 14.0–43.0)
BKR WAM MCH (PG): 30.2 pg (ref 25.7–31.0)
BKR WAM MCHC: 32.5 g/dL (ref 32.0–36.0)
BKR WAM MCV: 92.7 fL (ref 80.0–99.0)
BKR WAM MONOCYTE ABSOLUTE COUNT: 0.5 x 1000/??L (ref 0.4–1.3)
BKR WAM MONOCYTES: 2.9 % (ref 0.0–14.0)
BKR WAM MPV: 10 fL (ref 9.8–12.3)
BKR WAM NEUTROPHILS: 88.2 % — ABNORMAL HIGH (ref 38.0–74.0)
BKR WAM NUCLEATED RED BLOOD CELLS: 0 % (ref 0.0–0.2)
BKR WAM PLATELETS: 251 x1000/??L — ABNORMAL HIGH (ref 140–446)
BKR WAM RDW-CV: 14.6 % — ABNORMAL HIGH (ref 11.5–14.5)
BKR WAM RED BLOOD CELL COUNT: 4.5 M/??L — ABNORMAL LOW (ref 4.6–6.1)
BKR WAM WHITE BLOOD CELL COUNT: 16.8 x1000/??L — ABNORMAL HIGH (ref 3.8–10.6)

## 2020-07-02 LAB — REFLEX LACTIC ACID, PLASMA: BKR LACTATE: 2 mmol/L (ref 0.4–2.0)

## 2020-07-02 LAB — PROCALCITONIN     (BH GH LMW Q YH)
BKR PROCALCITONIN: 0.09 ng/mL
BKR PROCALCITONIN: 0.16 ng/mL

## 2020-07-02 LAB — NT-PROBNPE: BKR B-TYPE NATRIURETIC PEPTIDE, PRO (PROBNP): 76 pg/mL (ref 0.0–900.0)

## 2020-07-02 LAB — RESPIRATORY VIRUS PCR PANEL     (BH GH LMW Q YH)
BKR ADENOVIRUS: NOT DETECTED
BKR HUMAN METAPNEUMOVIRUS (HMPV): NOT DETECTED
BKR INFLUENZA A H1: NOT DETECTED
BKR INFLUENZA A H3: NOT DETECTED
BKR INFLUENZA A: NOT DETECTED mg/L FEU (ref <0.49)
BKR INFLUENZA B: NOT DETECTED
BKR PARAINFLUENZA VIRUS 1: NOT DETECTED
BKR PARAINFLUENZA VIRUS 2: NOT DETECTED
BKR PARAINFLUENZA VIRUS 3: NOT DETECTED
BKR PARAINFLUENZA VIRUS 4: NOT DETECTED
BKR RESPIRATORY SYNCYTIAL VIRUS A: NOT DETECTED
BKR RESPIRATORY SYNCYTIAL VIRUS B: NOT DETECTED
BKR RHINOVIRUS: NOT DETECTED

## 2020-07-02 LAB — ETHANOL     (BH GH L LMW YH): BKR ETHANOL: <3 mg/dL (ref 0–3)

## 2020-07-02 LAB — LACTIC ACID, PLASMA (REFLEX 2H REPEAT): BKR LACTATE: 2.6 mmol/L — ABNORMAL HIGH (ref 0.4–2.0)

## 2020-07-02 LAB — C-REACTIVE PROTEIN     (CRP): BKR C-REACTIVE PROTEIN: 2.8 mg/dL — ABNORMAL HIGH (ref 0.0–1.0)

## 2020-07-02 LAB — PROTIME AND INR
BKR INR: 3.05 — ABNORMAL HIGH (ref 0.88–1.14)
BKR PROTHROMBIN TIME: 30.3 seconds — ABNORMAL HIGH (ref 9.4–12.0)

## 2020-07-02 LAB — CARDIAC PROFILE (TROPONIN I REFLEX CK/CKMB): BKR TROPONIN I: <0.015 ng/mL (ref 0.000–1.500)

## 2020-07-02 LAB — D-DIMER, QUANTITATIVE: BKR D-DIMER: 0.36 mg{FEU}/L (ref <0.49)

## 2020-07-02 MED ORDER — PANTOPRAZOLE 40 MG TABLET,DELAYED RELEASE
40 mg | Freq: Every day | ORAL | Status: DC
Start: 2020-07-02 — End: 2020-07-02

## 2020-07-02 MED ORDER — MELATONIN 3 MG TABLET
3 mg | Freq: Every evening | ORAL | Status: DC | PRN
Start: 2020-07-02 — End: 2020-07-05
  Administered 2020-07-03 – 2020-07-05 (×3): 3 mg via ORAL

## 2020-07-02 MED ORDER — ALBUTEROL SULFATE 2.5 MG/3 ML (0.083 %) SOLUTION FOR NEBULIZATION
2.530.083 mg /3 mL (0.083 %) | Freq: Four times a day (QID) | RESPIRATORY_TRACT | Status: DC
Start: 2020-07-02 — End: 2020-07-02

## 2020-07-02 MED ORDER — SODIUM CHLORIDE 0.9 % (FLUSH) INJECTION SYRINGE
0.9 % | Freq: Three times a day (TID) | INTRAVENOUS | Status: DC
Start: 2020-07-02 — End: 2020-07-05
  Administered 2020-07-02 – 2020-07-05 (×8): 0.9 mL via INTRAVENOUS

## 2020-07-02 MED ORDER — SODIUM CHLORIDE 0.9 % (FLUSH) INJECTION SYRINGE
0.9 % | INTRAVENOUS | Status: DC | PRN
Start: 2020-07-02 — End: 2020-07-05

## 2020-07-02 MED ORDER — IPRATROPIUM 0.5 MG-ALBUTEROL 3 MG (2.5 MG BASE)/3 ML NEBULIZATION SOLN
0.5 mg-3 mg(2.5 mg base)/3 mL | Freq: Four times a day (QID) | RESPIRATORY_TRACT | Status: DC
Start: 2020-07-02 — End: 2020-07-02

## 2020-07-02 MED ORDER — ZZ IMS TEMPLATE
Freq: Once | ORAL | Status: CP
Start: 2020-07-02 — End: ?
  Administered 2020-07-02: 22:00:00 1 mg via ORAL

## 2020-07-02 MED ORDER — ROSUVASTATIN 20 MG TABLET
20 mg | Freq: Every day | ORAL | Status: DC
Start: 2020-07-02 — End: 2020-07-05
  Administered 2020-07-03 – 2020-07-05 (×3): 20 mg via ORAL

## 2020-07-02 MED ORDER — AZITHROMYCIN 500MG IN 250ML IVPB (VIALMATE)
INTRAVENOUS | Status: DC
Start: 2020-07-02 — End: 2020-07-05
  Administered 2020-07-02 – 2020-07-04 (×3): 250.000 mL/h via INTRAVENOUS

## 2020-07-02 MED ORDER — DEXAMETHASONE SODIUM PHOSPHATE 4 MG/ML INJECTION SOLUTION
4 mg/mL | Freq: Once | INTRAVENOUS | Status: CP
Start: 2020-07-02 — End: ?
  Administered 2020-07-02: 12:00:00 4 mL via INTRAVENOUS

## 2020-07-02 MED ORDER — PANTOPRAZOLE 40 MG TABLET,DELAYED RELEASE
40 mg | Freq: Every day | ORAL | Status: DC
Start: 2020-07-02 — End: 2020-07-03
  Administered 2020-07-03: 40 mg via ORAL

## 2020-07-02 MED ORDER — PREDNISONE 20 MG TABLET
20 mg | Freq: Every day | ORAL | Status: DC
Start: 2020-07-02 — End: 2020-07-02

## 2020-07-02 MED ORDER — METHYLPREDNISOLONE SOD SUCC (PF) 40 MG/ML SOLUTION FOR INJECTION
40 mg/mL | Freq: Two times a day (BID) | INTRAVENOUS | Status: DC
Start: 2020-07-02 — End: 2020-07-03
  Administered 2020-07-03 (×2): 40 mL via INTRAVENOUS

## 2020-07-02 MED ORDER — PREGABALIN 50 MG CAPSULE
50 mg | Freq: Two times a day (BID) | ORAL | Status: DC
Start: 2020-07-02 — End: 2020-07-05
  Administered 2020-07-03 – 2020-07-05 (×6): 50 mg via ORAL

## 2020-07-02 MED ORDER — LORAZEPAM 2 MG/ML INJECTION SOLUTION
2 mg/mL | Freq: Once | INTRAVENOUS | Status: CP
Start: 2020-07-02 — End: ?
  Administered 2020-07-02: 12:00:00 2 mL via INTRAVENOUS

## 2020-07-02 MED ORDER — ALBUTEROL SULFATE 2.5 MG/3 ML (0.083 %) SOLUTION FOR NEBULIZATION
2.5 mg /3 mL (0.083 %) | Freq: Once | RESPIRATORY_TRACT | Status: DC
Start: 2020-07-02 — End: 2020-07-02

## 2020-07-02 MED ORDER — PIPERACILLIN-TAZOBACTAM (ZOSYN) 3.375GM MBP
Freq: Four times a day (QID) | INTRAVENOUS | Status: DC
Start: 2020-07-02 — End: 2020-07-03
  Administered 2020-07-02 – 2020-07-03 (×6): 50.000 mL/h via INTRAVENOUS

## 2020-07-02 MED ORDER — IPRATROPIUM BROMIDE 0.02 % SOLUTION FOR INHALATION
0.02 % | Freq: Four times a day (QID) | RESPIRATORY_TRACT | Status: DC
Start: 2020-07-02 — End: 2020-07-04
  Administered 2020-07-02 – 2020-07-04 (×6): 0.02 mL via RESPIRATORY_TRACT

## 2020-07-02 MED ORDER — WARFARIN 5 MG TABLET
5 mg | Freq: Once | ORAL | Status: DC
Start: 2020-07-02 — End: 2020-07-02

## 2020-07-02 MED ORDER — IBUPROFEN 600 MG TABLET
600 mg | Freq: Once | ORAL | Status: CP
Start: 2020-07-02 — End: ?
  Administered 2020-07-02: 12:00:00 600 mg via ORAL

## 2020-07-02 MED ORDER — HYDROCHLOROTHIAZIDE 25 MG TABLET
25 mg | Freq: Every day | ORAL | Status: DC
Start: 2020-07-02 — End: 2020-07-05
  Administered 2020-07-03 – 2020-07-05 (×3): 25 mg via ORAL

## 2020-07-02 MED ORDER — TIOTROPIUM 2.5 MCG-OLODATEROL 2.5 MCG/ACTUATION MIST FOR INHALATION
2.5-2.5 mcg/actuation | Freq: Every day | RESPIRATORY_TRACT | Status: DC
Start: 2020-07-02 — End: 2020-07-05
  Administered 2020-07-04 – 2020-07-05 (×2): via RESPIRATORY_TRACT

## 2020-07-02 MED ORDER — ALUMINUM-MAG HYDROXIDE-SIMETHICONE 200 MG-200 MG-20 MG/5 ML ORAL SUSP
200-200-20 mg/5 mL | Freq: Once | ORAL | Status: CP
Start: 2020-07-02 — End: ?
  Administered 2020-07-03: 200-200-20 mL via ORAL

## 2020-07-02 MED ORDER — SODIUM CHLORIDE 0.9 % BOLUS (NEW BAG)
0.9 % | Freq: Once | INTRAVENOUS | Status: CP
Start: 2020-07-02 — End: ?
  Administered 2020-07-02: 13:00:00 0.9 mL/h via INTRAVENOUS

## 2020-07-02 MED ORDER — POTASSIUM CHLORIDE ER 20 MEQ TABLET,EXTENDED RELEASE(PART/CRYST)
20 MEQ | Freq: Once | ORAL | Status: CP
Start: 2020-07-02 — End: ?
  Administered 2020-07-02: 18:00:00 20 MEQ via ORAL
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
## 2020-07-02 NOTE — Other
Pulmonary ConsultIvar VIRAL [name] is a 69 y.o. year old man, a former smoker of 1 PPD x 51 years, with COPD and prior pulmonary embolism, whom we know from prior hospitalizations and the office.He has had several recent admissions: Most recently July 19-24 for COPD exacerbation. During that admission he was noted to have some ground glass changes on [place]--?element of CHFJune 24-30 for COPD exacerbation and extension of RLL pulmonary embolism. Changed to warfarinMarch 4-8 for pulmonary embolismHis underlying lung disease is fairly severe emphysema with only mild obstructive dysfunction. He is poorly tolerant of inhalers--he feels they make him crazy. 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 Pulmonary embolism in March of this year as noted. He is now admitted after his recent discharge with fairly abrupt onset of sob this morning. He thinks he was feeling ok last night, but is a very poor historian. In the ED he was febrile and hypoxic.  Given dexamethasone 10mg   and zosyn.  When I saw him he was uncomfortable appearing. On 100% NRB which he removed--saturation fell to high 80s low 90s onlyIvar J [name]  has a past medical history of Basal cell carcinoma, Blood transfusion, without reported diagnosis, BPH (benign prostatic hyperplasia), Concussion (04/2016), COPD (chronic obstructive pulmonary disease) (HC Code) (04/10/2015), Dupuytren's contracture of both hands, Fatty liver (10/14/2018), GERD (gastroesophageal reflux disease), Hematuria, History of gastric ulcer, Hypercholesteremia, Hypertension, Migraine, Neurogenic bladder, Osteoarthritis (09/19/2013), Renal colic, Respiratory arrest (HC Code) (04/2016), Sepsis (HC Code), Urinary problem, and UTI (urinary tract infection). [name]  has a past surgical history that includes anterior cervical fusion (03/2012); Mohs surgery; repair dupuytrens contracture (Bilateral); Arthroscopy shoulder w/ open rotator cuff repair (Right, 2012); Cervical spine surgery; and hand surgery (Right, 11/2019).[name]  reports that he quit smoking about 21 months ago. His smoking use included cigarettes. He smoked 0.25 packs per day. He has never used smokeless tobacco. He reports current alcohol use of about 1.0 - 2.0 standard drinks of alcohol per week. He reports that he does not use drugs.[name] family history includes Arthritis in his father and mother.[name] is allergic to hydrocodone; percocet [oxycodone-acetaminophen]; and duoneb [ipratropium-albuterol].Current Facility-Administered Medications: ?  albuterol neb sol 2.5 mg/3 mL (0.083%) (PROVENTIL,VENTOLIN), 2.5 mg, Nebulization, Once, Altamese Cabal, MD?  ipratropium-albuteroL (DUO-NEB) 0.5 mg-3 mg(2.5 mg base)/3 mL nebulizer solution 3 mL, 3 mL, Nebulization, Q6H WA, [name], Chanjuan, MD?  piperacillin-tazobactam (ZOSYN) 3.375 g in sodium chloride 0.9% 50 mL (mini-bag plus), 3.375 g, Intravenous, Q6H, Altamese Cabal, MD, Stopped at 07/02/20 0933?  sodium chloride 0.9 % flush 3 mL, 3 mL, IV Push, Q8H, [name], Chanjuan, MD?  sodium chloride 0.9 % flush 3 mL, 3 mL, IV Push, PRN for Line Care, Mayme Genta, MD?  warfarin (COUMADIN) tablet 5 mg, 5 mg, Oral, Once (1800), [name], [name], MDCurrent Outpatient Medications: ?  ascorbic acid, vitamin C, (VITAMIN C) 500 mg tablet, Take 500 mg by mouth 2 (two) times daily. , Disp: , Rfl: ?  baclofen (LIORESAL) 5 mg tablet, Take 5 mg by mouth 2 (two) times daily as needed., Disp: , Rfl: ?  cholecalciferol (VITAMIN D-3) 50 mcg (2,000 unit) capsule, Take 2,000 Units by mouth 2 (two) times daily. , Disp: , Rfl: ?  enoxaparin (LOVENOX) 120 mg/0.8 mL subcutaneous syringe, Inject 0.8 mLs (120 mg total) under the skin every 12 (twelve) hours., Disp: 6 Syringe, Rfl: 0?  famotidine (PEPCID) 20 mg tablet, Take 1 tablet (20 mg total) by mouth 2 (  two) times daily., Disp: 30 tablet, Rfl: 0?  hydroCHLOROthiazide (HYDRODIURIL) 25 mg tablet, Take 1 tablet (25 mg total) by mouth daily., Disp: 30 tablet, Rfl: 0?  HYDROmorphone (DILAUDID) 4 mg tablet, Take 4 mg by mouth every 6 (six) hours as needed (Pain)., Disp: , Rfl: ?  ipratropium-albuteroL (DUO-NEB) 0.5 mg-3 mg(2.5 mg base)/3 mL nebulizer solution, Take 3 mLs by nebulization every 6 (six) hours as needed for wheezing., Disp: 180 mL, Rfl: 0?  levalbuterol (XOPENEX) 45 mcg/actuation HFA aerosol inhaler, Inhale 1-2 puffs into the lungs every 4 (four) hours as needed for wheezing., Disp: 15 g, Rfl: 0?  melatonin 5 mg tablet, Take 5-10 mg by mouth nightly as needed (Sleep). , Disp: , Rfl: ?  pantoprazole (PROTONIX) 40 mg tablet, Take 1 tablet (40 mg total) by mouth daily., Disp: 90 tablet, Rfl: 2?  Potassium chloride SA (K-DUR,KLOR-CON M) 10 MEQ 24 hr tablet, Take 1 tablet (10 mEq total) by mouth daily. Swallow whole; do not break, crush, or chew., Disp: 7 tablet, Rfl: 0?  predniSONE (DELTASONE) 10 mg tablet, Take 4 tablets (40 mg total) by mouth daily for 3 days, THEN 2 tablets (20 mg total) daily for 3 days, THEN 1 tablet (10 mg total) daily for 3 days, THEN 0.5 tablets (5 mg total) daily for 3 days. Take with food.., Disp: 23 tablet, Rfl: 0?  pregabalin (LYRICA) 100 mg capsule, Take 100 mg by mouth 2 (two) times daily., Disp: , Rfl: ?  rosuvastatin (CRESTOR) 40 mg tablet, TAKE 1 TABLET(40 MG) BY MOUTH EVERY NIGHT, Disp: 90 tablet, Rfl: 1?  senna (SENOKOT) 8.6 mg tablet, Take 1 tablet by mouth daily., Disp: , Rfl: ?  warfarin (COUMADIN) 2 mg tablet, Take one tab with 5 mg tablet of warfarin (Coumadin) on Tues, Thursday, Saturday, Disp: 12 tablet, Rfl: 0 ?  warfarin (COUMADIN) 5 mg tablet, TAKE 1 TABLET BY MOUTH EVERY NIGHT, Disp: 90 tablet, Rfl: 0Pulse:  [98-125] 98Resp:  [30] 30BP: (125-135)/(65-76) 135/76SpO2:  [94 %-98 %] 95 %Device (Oxygen Therapy): nonrebreather maskO2 Flow (L/min):  [15] 15Awake, uncomfortable appearing as noted. Mild prolongation of the expiratory phase.Decreased breath sounds bilaterally and poor air entry with rales and rhonchi in the lower lung fieldsTachycardicAbdomen distended and firm, but nontenderMild edema bilaterallyRecent Labs   08/05/210709 NA 141 K 3.4* CL 104 CO2 30 BUN 20 CREATININE 0.89 GLU 123* Lab Results Component Value Date  ALT 108 (H) 07/02/2020  AST 32 07/02/2020  GGT 69 05/24/2020  ALKPHOS 163 (H) 07/02/2020  BILITOT 0.5 07/02/2020 Recent Labs   08/05/210709 WBC 16.8* HGB 13.7 PLT 251  Recent Labs   08/05/210709 INR 3.05* Recent Labs   08/05/210709 08/05/210926 CRP 2.8*  --  DDIMER 0.36  --  BNPPRO 76.0  --  TROPONINI <0.015  --  PROCALCITON 0.09 0.16 lactate 2.6Results for orders placed or performed during the hospital encounter of 07/02/20 Blood gas, arterial Result Value Ref Range  Site Right Brachial   pH Arterial 7.45 7.35 - 7.45 units  pCO2, Arterial 44 35 - 45 mmHg  pO2, Arterial 72 (L) 75 - 100 mmHg  Calculated HCO3, Arterial 30.1 (H) 20.0 - 26.0 mmol/L  Base Excess, Arterial 6 (H) -2 - 2 mmol/L  Patient Temperature 37.0 Celsius  O2 Sat, Arterial 93 (L) 95 - 97 %  Carboxyhemoglobin, Arterial 1.1 0.5 - 1.5 %  Methemoglobin, Arterial 0.5 0.0 - 1.5 %  Appliance Non Rebreather 15 LPM  PFT 09/28/2020Vital capacity is normal.Flow rates are reduced relative to vital  capacity.There is no significant response to inhaled bronchodilators at the time of the study.Fractional excretion of nitric oxide is low at seven parts per billion.Assessment: Mild obstructive airways dysfunction.Compared to the prior study of 12/27/2017 flow rates are perhaps slightly lower.Last echo in MarchResults for orders placed or performed during the hospital encounter of 01/30/20 Echo 2D Complete w Doppler and CFI if Ind Image Enhancement 3D and or bubbles Result Value Ref Range  Reported Visual Range EF% 55-60 %  Narrative   * Technically very limited* Normal left ventricular size and systolic function. Moderate concentric left ventricular hypertrophy.  LVEF estimated by visual assessment was between 55-60%.  Normal diastolic function and filling pressures.* Normal right ventricular cavity size.  Mildly decreased right ventricular systolic function on contrast images.  Estimated right ventricular systolic pressure is 26 mmHg.  TAPSE 2.0 cm- low nl.* Aortic valve was not well visualized.* Normal mitral valve leaflets.  No mitral regurgitation.  No mitral stenosis.* Tricuspid valve was not well visualized.* Pulmonic valve was not well visualized.* All visible segments of the aorta are normal in size.* IVC diameter < 2.1 cm that collapses < 50% with a sniff suggests mildly increased RAP (5-10 mmHg, mean 8 mmHg).* No evidence of pericardial effusion. CXRCompared to prior there is likely infiltrate on the R with some volume loss vs due to projection. Possible effusions bilaterallyPrior CTs reviewed: ground glass opacities were present on the last [place] on the L sideSevere emphysema and areas of fibrosisRLL pulmonary embolism resolved AUnderlying COPD/emphysematous type. Likely pneumonia, aspiration is possibleExam not suggetive of severe exacerbation, but he has already received steroids and nebsHypoxic respiratory failureH/o PE, supratherepeutic on warfarinPSystemic steroid--would continue--hope to taper soon.  Solumedrol 40q12Nebulized bronchodilators--duoneb standing q 6 hours Try adding stiolto--he does not tolerate MDIs, but may do ok with the respimat device. Agree with zosyn, but would also add atypical coverage with azithromycin and check strep/legionella and respiratory virus panel, sputum cultures if ableAC for h/o PESupplemental oxygen as needed--titrate down as tolerated. Consider ETOH level, f/u LFTs and abdominal examRepeat CXR PA and lateral when he is able to go downElectronically Signed by Adrienne Mocha, MD, July 02, 2020

## 2020-07-02 NOTE — ED Notes
SBAR HandoffSituation:	Admitting Diagnosis: The primary encounter diagnosis was Pneumonia of right lower lobe due to infectious organism. Diagnoses of Chronic obstructive pulmonary disease, unspecified COPD type (HC Code) and Sepsis, due to unspecified organism, unspecified whether acute organ dysfunction present [facility] Code) were also pertinent to this visit.Background:  Chief Complaint Patient presents with ? Respiratory Distress   pt to ED from home, hx of COPD, hypoxic, febrile Isolation status: r/o coivdAllergies:Allergies as of 07/02/2020 - Review Complete 07/02/2020 Allergen Reaction Noted ? Hydrocodone Unknown 11/29/2018 ? Percocet [oxycodone-acetaminophen] Other (See Comments) 02/28/2013 ? Duoneb [ipratropium-albuterol]  10/11/2018 Code status: FULL CODEAssessment:Vital signs:	Vitals:  07/02/20 0705 07/02/20 0836 07/02/20 0844 BP: 135/65  125/72 Pulse: (!) 125 (!) 120 (!) 118 Resp: (!) 30   SpO2: 98% 96% 94% Medications ordered and administered in the Emergency Department:	Medications sodium chloride 0.9 % (new bag) bolus 500 mL (has no administration in time range) albuterol neb sol 2.5 mg/3 mL (0.083%) (PROVENTIL,VENTOLIN) (2.5 mg Nebulization Refused 07/02/20 0839) piperacillin-tazobactam (ZOSYN) 3.375 g in sodium chloride 0.9% 50 mL (mini-bag plus) (3.375 g Intravenous New Bag 07/02/20 0817) ibuprofen (ADVIL,MOTRIN) tablet 600 mg (600 mg Oral Given 07/02/20 0818) dexamethasone (DECADRON) injection 10 mg (10 mg Intravenous Given 07/02/20 0815) LORazepam (ATIVAN) injection 1 mg (1 mg IV Push Given 07/02/20 0815) Labs ordered and resulted in the Emergency Department.	Results for orders placed or performed during the hospital encounter of 07/02/20 Protime-INR Result Value Ref Range  Prothrombin Time 30.3 (H) 9.4 - 12.0 seconds  INR 3.05 (H) 0.88 - 1.14 Lactic acid, plasma (reflex 2h repeat) Result Value Ref Range  Lactate 2.6 (H) 0.4 - 2.0 mmol/L CBC auto differential Result Value Ref Range  WBC 16.8 (H) 3.8 - 10.6 x1000/?L  RBC 4.5 (L) 4.6 - 6.1 M/?L  Hemoglobin 13.7 13.7 - 18.0 g/dL  Hematocrit 16.1 09.6 - 54.0 %  MCV 92.7 80.0 - 99.0 fL  MCHC 32.5 32.0 - 36.0 g/dL  RDW-CV 04.5 (H) 40.9 - 14.5 %  Platelets 251 140 - 446 x1000/?L  MPV 10.0 9.8 - 12.3 fL  ANC (Abs Neutrophil Count) 14.8 (H) 1.8 - 7.3 x 1000/?L  Neutrophils 88.2 (H) 38.0 - 74.0 %  Lymphocytes 7.7 (L) 14.0 - 43.0 %  Absolute Lymphocyte Count 1.3 1.0 - 2.3 x 1000/?L  Monocytes 2.9 0.0 - 14.0 %  Monocyte Absolute Count 0.5 0.4 - 1.3 x 1000/?L  Eosinophils 0.5 0.0 - 6.0 %  Eosinophil Absolute Count 0.1 0.0 - 0.4 x 1000/?L  Basophil 0.2 0.0 - 2.0 %  Basophil Absolute Count 0.0 0.0 - 0.6 x 1000/?L  Immature Granulocytes 0.5 0.0 - 2.0 %  Absolute Immature Granulocyte Count 0.1 0.0 - 0.2 x 1000/?L  nRBC 0.0 0.0 - 0.2 %  Absolute nRBC 0.0 <=0.0 x 1000/?L  MCH 30.2 25.7 - 31.0 pg Comprehensive metabolic panel Result Value Ref Range  Sodium 141 136 - 145 mmol/L  Potassium 3.4 (L) 3.5 - 5.1 mmol/L  Chloride 104 95 - 115 mmol/L  CO2 30 21 - 32 mmol/L  Anion Gap 7 5 - 18  Glucose 123 (H) 70 - 100 mg/dL  BUN 20 8 - 25 mg/dL  Creatinine 8.11 9.14 - 1.30 mg/dL  Calcium 8.5 8.4 - 78.2 mg/dL  BUN/Creatinine Ratio 95.6 8.0 - 25.0  Total Protein 6.5 6.4 - 8.2 g/dL  Albumin 3.5 3.4 - 5.0 g/dL  Total Bilirubin 0.5 0.0 - 1.0 mg/dL  Alkaline Phosphatase 213 (H) 20 - 135 U/L  Alanine Aminotransferase (ALT) 108 (H) 12 -  78 U/L  Aspartate Aminotransferase (AST) 32 5 - 37 U/L  Globulin 3.0 g/dL  A/G Ratio 1.2   AST/ALT Ratio 0.3 See Comment  eGFR (Afr Amer) >60 >60 mL/min/1.48m2  eGFR (NON African-American) >60 >60 mL/min/1.53m2  Osmolality Calculation 285 275 - 295 mOsm/kg Cardiac profile (Troponin I reflex CK/CKMB) Result Value Ref Range  Troponin I <0.015 0.000 - 1.500 ng/mL NT-proBrain natriuretic peptide Result Value Ref Range  B-Type Natriuretic Peptide, Pro (proBNP) 76.0 0.0 - 900.0 pg/mL C-reactive protein Result Value Ref Range  C-Reactive Protein 2.8 (H) 0.0 - 1.0 mg/dL EKG Result Value Ref Range  Heart Rate 119 bpm  QRS Duration 86 ms  Q-T Interval 324 ms  QTC Calculation(Bezet) 455 ms  P Axis 40 deg  R Axis 0 deg  T Axis 34 deg  P-R Interval 156 msec  ECG - SEVERITY Abnormal ECG severity Radiology studies done in ER: 	CXR (portable) Final Result  Right lower lobe pneumonia with small parapneumonic effusion. Correlation with PA and lateral radiograph is recommended when clinically feasible. Followup to ensure radiographic clearance is recommended.   Reported and Signed by:  Sandi Raveling, MD  IV access:	 forearm right, condition patent, no redness and DDI	Mental status: Normal and CalmFunctional Status: Needs Assistance: fall riskCurrent pain level: 0Dysphagia screen performed: NoIs patient a fall risk: YesIs patient on oxygen: Yes, non rebreatherSitter ordered/needed? NoRecommendations:		Outstanding tests: Yes, see admission orders	Consults: Yes, see admission orders	Pending labs/meds/blood products: Yes, see admission orders	Brief plan/summary:

## 2020-07-02 NOTE — ED Notes
9:33 AM pt endorsd to writer @9am , pt ax0x4, no distress, continues on non-rebreather, labs pending, pending bed assignment, continue on cardiac monitoring. vss

## 2020-07-02 NOTE — Plan of Care
Plan of Care Overview/ Patient Status    Pt removed NRM. Attempted to place NC. Pt aggressive, verbally abusive. Unable to place NC. Dr. Irena Reichmann notified.
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
## 2020-07-02 NOTE — Utilization Review (ED)
UM Status: Medicare Criteria Met

## 2020-07-02 NOTE — Plan of Care
Plan of Care Overview/ Patient Status    Admission Note Nursing [name] is a 69 y.o. male admitted with a chief complaint of SOB. Patient arrived from  homePatient is   AOx4Vital signs  Vital SignsTemp: 97.4 ?F (36.3 ?C)Temp src: AxillaryPulse: 86Heart Rate Source: MonitorResp: (!) 30BP: (!) 100/58NIBP MAP (mmHg): 72BP Location: Left armBP Method: AutomaticPatient Position: LyingOxygen therapy Oxygen TherapySpO2: 98 %Device (Oxygen Therapy): nonrebreather maskO2 Flow (L/min): 15Patient was assisted to bed, oriented to telemetry unit. Pt was encouraged to verbalize any needs and concerns.See flowsheets, patient education and plan of care for additional information.

## 2020-07-03 LAB — BASIC METABOLIC PANEL
BKR ANION GAP: 4 — ABNORMAL LOW (ref 5–18)
BKR BLOOD UREA NITROGEN: 20 mg/dL (ref 8–25)
BKR BUN / CREAT RATIO: 25 x1000/??L (ref 8.0–25.0)
BKR CALCIUM: 8.8 mg/dL (ref 8.4–10.3)
BKR CHLORIDE: 108 mmol/L — ABNORMAL HIGH (ref 95–115)
BKR CO2: 29 mmol/L (ref 21–32)
BKR CREATININE: 0.8 mg/dL — ABNORMAL LOW (ref 0.50–1.30)
BKR EGFR (AFR AMER): >60 mL/min/{1.73_m2} — ABNORMAL HIGH (ref >60)
BKR EGFR (NON AFRICAN AMERICAN): >60 mL/min/{1.73_m2} (ref >60)
BKR GLUCOSE: 219 mg/dL — ABNORMAL HIGH (ref 70–100)
BKR OSMOLALITY CALCULATION: 291 mOsm/kg (ref 275–295)
BKR POTASSIUM: 3.9 mmol/L (ref 3.5–5.1)
BKR SODIUM: 141 mmol/L (ref 136–145)

## 2020-07-03 LAB — CBC WITH AUTO DIFFERENTIAL
BKR WAM ABSOLUTE IMMATURE GRANULOCYTES: 0.1 x 1000/??L (ref 0.0–0.2)
BKR WAM ABSOLUTE LYMPHOCYTE COUNT: 0.9 x 1000/??L — ABNORMAL LOW (ref 1.0–2.3)
BKR WAM ABSOLUTE NRBC: 0 x 1000/??L (ref <=0.0)
BKR WAM ANALYZER ANC: 16.2 x 1000/ÂµL — ABNORMAL HIGH (ref 1.8–7.3)
BKR WAM BASOPHIL ABSOLUTE COUNT: 0 x 1000/??L (ref 0.0–0.6)
BKR WAM BASOPHILS: 0.1 % (ref 0.0–2.0)
BKR WAM EOSINOPHIL ABSOLUTE COUNT: 0 x 1000/??L (ref 0.0–0.4)
BKR WAM EOSINOPHILS: 0 % (ref 0.0–6.0)
BKR WAM HEMATOCRIT: 39 % — ABNORMAL LOW (ref 41.0–54.0)
BKR WAM HEMOGLOBIN: 12 g/dL — ABNORMAL LOW (ref 13.7–18.0)
BKR WAM IMMATURE GRANULOCYTES: 0.5 % (ref 0.0–2.0)
BKR WAM LYMPHOCYTES: 5.3 % — ABNORMAL LOW (ref 14.0–43.0)
BKR WAM MCH (PG): 29.2 pg (ref 25.7–31.0)
BKR WAM MCHC: 30.8 g/dL — ABNORMAL LOW (ref 32.0–36.0)
BKR WAM MCV: 94.9 fL (ref 80.0–99.0)
BKR WAM MONOCYTE ABSOLUTE COUNT: 0.3 x 1000/??L — ABNORMAL LOW (ref 0.4–1.3)
BKR WAM MONOCYTES: 1.8 % (ref 0.0–14.0)
BKR WAM MPV: 10.5 fL (ref 9.8–12.3)
BKR WAM NEUTROPHILS: 92.3 % — ABNORMAL HIGH (ref 38.0–74.0)
BKR WAM NUCLEATED RED BLOOD CELLS: 0 % (ref 0.0–0.2)
BKR WAM PLATELETS: 234 x1000/ÂµL (ref 140–446)
BKR WAM RDW-CV: 14.5 % (ref 11.5–14.5)
BKR WAM RED BLOOD CELL COUNT: 4.1 M/??L — ABNORMAL LOW (ref 4.6–6.1)
BKR WAM WHITE BLOOD CELL COUNT: 17.5 x1000/ÂµL — ABNORMAL HIGH (ref 3.8–10.6)

## 2020-07-03 LAB — PROTIME AND INR
BKR INR: 3.44 — ABNORMAL HIGH (ref 0.88–1.14)
BKR PROTHROMBIN TIME: 33.8 seconds — ABNORMAL HIGH (ref 9.4–12.0)

## 2020-07-03 MED ORDER — METHYLPREDNISOLONE SOD SUCC (PF) 40 MG/ML SOLUTION FOR INJECTION
40 mg/mL | Freq: Two times a day (BID) | INTRAVENOUS | Status: DC
Start: 2020-07-03 — End: 2020-07-03

## 2020-07-03 MED ORDER — METHYLPREDNISOLONE SOD SUCC (PF) 40 MG/ML SOLUTION FOR INJECTION
40 mg/mL | Freq: Two times a day (BID) | INTRAVENOUS | Status: CP
Start: 2020-07-03 — End: ?
  Administered 2020-07-04: 40 mL via INTRAVENOUS

## 2020-07-03 MED ORDER — PIPERACILLIN-TAZOBACTAM (ZOSYN) 4.5GM MBP
Freq: Four times a day (QID) | INTRAVENOUS | Status: DC
Start: 2020-07-03 — End: 2020-07-05
  Administered 2020-07-03 – 2020-07-05 (×7): 100.000 mL/h via INTRAVENOUS

## 2020-07-03 MED ORDER — HYDROMORPHONE 2 MG TABLET
2 mg | Freq: Four times a day (QID) | ORAL | Status: DC | PRN
Start: 2020-07-03 — End: 2020-07-05
  Administered 2020-07-03 – 2020-07-05 (×5): 2 mg via ORAL

## 2020-07-03 MED ORDER — FAMOTIDINE 20 MG TABLET
20 mg | Freq: Two times a day (BID) | ORAL | Status: DC
Start: 2020-07-03 — End: 2020-07-05
  Administered 2020-07-04 – 2020-07-05 (×4): 20 mg via ORAL

## 2020-07-03 MED ORDER — PREDNISONE 20 MG TABLET
20 mg | Freq: Every day | ORAL | Status: DC
Start: 2020-07-03 — End: 2020-07-05
  Administered 2020-07-04 – 2020-07-05 (×2): 20 mg via ORAL

## 2020-07-03 NOTE — Progress Notes
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 Subjective:No acute events overnight. Per nursing he removed his NRB last night and was switched to NC. This morning he denies chest pain, SOB, cough, rhinorrhea, n/v abdominal pain, diarrhea, fevers, chills. He reports that he feels better than yesterday and is comfortable breathing with the NC and even without the NC. Reported some constant LB pain (as he suffers from chronic pain) and received 2mg  dilaudid PO. Meds: Scheduled Meds:Current Facility-Administered Medications Medication Dose Route Frequency Provider Last Rate Last Admin ? azithromycin (ZITHROMAX) 500 mg in sodium chloride 0.9% 250 mL IVPB (vialmate)  500 mg Intravenous Q24H Jannette Fogo, MD 250 mL/hr at 07/02/20 1418 500 mg at 07/02/20 1418 ? hydroCHLOROthiazide (HYDRODIURIL) tablet 25 mg  25 mg Oral Daily Mayme Genta, MD   25 mg at 07/03/20 0816 ? ipratropium (ATROVENT) 0.02 % nebulizer solution 0.5 mg  0.5 mg Nebulization Q6H WA Mayme Genta, MD   0.5 mg at 07/03/20 4270 ? methylPREDNISolone PF (Solu-MEDROL PF) injection 40 mg  40 mg Intravenous Q12H Mayme Genta, MD   40 mg at 07/03/20 0816 ? pantoprazole (PROTONIX) EC tablet 40 mg  40 mg Oral Daily Mayme Genta, MD   40 mg at 07/02/20 2023 ? piperacillin-tazobactam (ZOSYN) 3.375 g in sodium chloride 0.9% 50 mL (mini-bag plus)  3.375 g Intravenous Q6H Altamese Cabal, MD 16.7 mL/hr at 07/03/20 0817 3.375 g at 07/03/20 0817 ? pregabalin (LYRICA) capsule 100 mg  100 mg Oral BID Mayme Genta, MD   100 mg at 07/03/20 0817 ? rosuvastatin (CRESTOR) tablet 40 mg  40 mg Oral Daily Mayme Genta, MD   40 mg at 07/03/20 0817 ? sodium chloride 0.9 % flush 3 mL  3 mL IV Push Q8H Mayme Genta, MD   3 mL at 07/03/20 0626 ? tiotropium-olodateroL (STIOLTO RESPIMAT) 2.5-2.5 mcg/actuation mist for inhalation 2 Inhalation  2 Inhalation Inhalation Daily Adrienne Mocha, MD     Continuous Infusions:PRN Meds:HYDROmorphone, melatonin, sodium chlorideObjective: Vitals:Temp:  [97.4 ?F (36.3 ?C)-98.4 ?F (36.9 ?C)] 97.5 ?F (36.4 ?C)Pulse:  [80-104] 83Resp:  [20] 20BP: (95-135)/(53-76) 114/64SpO2:  [94 %-98 %] 94 %Device (Oxygen Therapy): nasal cannulaO2 Flow (L/min):  [3-5] 3  I/O's:Intake/Output Summary (Last 24 hours) at 07/03/2020 1037Last data filed at 07/02/2020 1046Gross per 24 hour Intake 500 ml Output -- Net 500 ml  Physical Exam:Gen:  Resting comfortably in bed, in NAD, NC attached properly HEENT: PERRL, no LAD, anicteric sclerae, non-erythematous conjunctivae, neck supple, no JVD, MMM CV: RRR S1 and S2 intact, no murmurs, rubs, gallops. Heart sounds muffled. Pulm: Decreased breath sounds in the R lower lung field, however CTA in all other lung fields,no wheezes, rhonchi, rales GI: Obese, normoactive bowel sounds, mildly distended abdomen, tender in the RUQ although no guarding or rebound tenderness, soft throughoutExt: No peripheral edema, warm, well perfused, pulses 2+ BL Neuro: A&Ox3, EOMI, face symmetric. CN2-12 grossly intact. No focal neurologic deficits noted. MSK: Full ROM. Dupuytren's contractures noted in BL hands. R thumb absent due to trauma accident. Skin: No rashes. Pink, well perfused.Labs: Recent Labs Lab 08/05/210709 08/06/210525 WBC 16.8* 17.5* HGB 13.7 12.0* HCT 42.1 39.0* PLT 251 234  Recent Labs Lab 08/05/210709 08/06/210525 NEUTROPHILS 88.2* 92.3*  Recent Labs Lab 08/05/210709 08/06/210525 NA 141 141 K 3.4* 3.9 CL 104 108 CO2 30 29 BUN 20 20 CREATININE 0.89 0.80 GLU 123* 219* ANIONGAP 7 4*  Recent Labs Lab 08/05/210709 08/06/210525 CALCIUM 8.5 8.8  Recent Labs Lab 08/05/210709 ALT 108* AST 32 ALKPHOS 163* BILITOT 0.5  Recent Labs Lab 08/05/210709 08/06/210525 LABPROT 30.3* 33.8* INR 3.05* 3.44*  Diagnostics/Radiology:Cxr (portable)?Result Date: 8/5/2021Right lower lobe pneumonia with small parapneumonic effusion. Correlation with PA and lateral radiograph is recommended when clinically feasible. Followup to ensure radiographic clearance is recommended. Reported and Signed by:  Sandi Raveling, MDMicrobiology:Blood ZO:[id number] for orders placed or performed during the hospital encounter of 07/02/20 Blood culture  Collection Time: 07/02/20  9:26 AM  Specimen: Peripheral; Blood Result Value  Blood Culture No Growth to Date  -----------------------------------------------------------------------------------------------ECG/Tele Events: Sinus tachy to the 120s (8/5) Assessment: 70 y.o. 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 Recent PMH notable for ARF in 04/2020 due to COPD exacerbation and extension of RLL PE followed by 05/2020 readmission for COPD exacerbation, d/c'd on prednisone taper which he was almost finished with when he came in to the hospital on this admission. Given CXR findings of new RLL pna, leukocytosis with elevated PMNs, lactate 2.6--> 2.0, and ABG positive for hypoxemia (pO2 72), absence of worsening cough productive of mucous, no wheezing, this is most likely Bacterial pneumonia in the setting of Chronic COPD. Would treat for Hospital acquired PNA given admission in the past 90 days. Less likely acute COPD exacerbation given absence of change in wheezing, cough production, and patient was taking steroid s/p most recent hospitalization. Less likely PE given INR within target range (3.05), consistent AC on warfarin and CXR findings suggestive of RLL pna. Plan: Hospitalist: Lesly Dukes, MDConsults: Pulm#COPD -Pneumonia-CXR (8/5) RLL pna, WBC 17.5, PMNs 92.3, procal .16, negative viral respiratory panel, decreased breath sounds in RL lung field although clinically Hypoxic respiratory failure improving. -D/c methylpred 40 mg q12 --> Prednisone 40 mg daily X3days starting 8/7-Zosyn 3.375 mg IV q6 (8/5-)-Azithromycin 500 mg IV q24 (8/5-)-Stiolto (tiotroprium, olodaterol) 2 puffs daily-Atrovent (iprotroprium) .5 mg nebs PRN  -Titrate O2 down to RA as tolerated-f/u sputum cx-f/u strep/legionella-repeat CXR PA and Lateral tomorrow #PE-continue Warfarin-monitor INR- supratherapeutic 3.44#Chronic LBP-Hydromorphone PRN -Pregabalin 100 mg BID-continue ambulating #HTN-continue home meds HTZ 25mg  PO daily FLUIDS: none ELECTROLYTES: Replenish to goal K >4 and Mg >2ACCESS: PIV DIET: Diet RegularPPX: Warfarin CODE STATUS: Full Code/ACLSPatient status and plan discussed with resident physician, Dr. Orvan Falconer and attending physician, Dr. Lesly Dukes, MD. Plan subject to attending/resident addenda to this note.Signed:Sienna Stonehocker Harold Barban, Fourth Year Medical StudentDate: 8/6/2021Time: 10:37 AM

## 2020-07-03 NOTE — Plan of Care
Plan of Care Overview/ Patient Status    Patient is A/O x 4. On 3L of O2 via NC sating 94% and above, VSS, afebrile. Reports DOE. NSR on the RTM. Independent with adls, up ad lib int he room. Appetite is good. Continent of B/B. Patient performs intermittent catheterization himself. C/O constant aching/stabbing lower back pain, dilaudid 2 mg administered PO, with good effect. Patient continues on Zosyn and Zithromax antibiotics. Call bell within reach and safety precautions maintained.

## 2020-07-03 NOTE — Plan of Care
Plan of Care Overview/ Patient Status  Received in bed sleeping, patient is AOx4, OOB with standby assist. Monitor shows SR. V/s stable. Patient does straight cath by himself.Slept well. Mild SOB noted on exertion. Most of the time in an angry mood. MRSA swab sent.  Needs attended.

## 2020-07-03 NOTE — Progress Notes
[facility]	Pulmonary Progress NoteAttending Provider: Lesly Dukes, MD Subjective: Interim History: apparently refused treatments overnight, angry.  This morning when I saw him he was ambulating in room, reports feeling much improved. On and off the nasal cannula oxygenReview of Allergies/Meds/Hx: Scheduled Meds:Current Facility-Administered Medications Medication Dose Route Frequency Provider Last Rate Last Admin ? azithromycin (ZITHROMAX) 500 mg in sodium chloride 0.9% 250 mL IVPB (vialmate)  500 mg Intravenous Q24H Jannette Fogo, MD 250 mL/hr at 07/02/20 1418 500 mg at 07/02/20 1418 ? hydroCHLOROthiazide (HYDRODIURIL) tablet 25 mg  25 mg Oral Daily Mayme Genta, MD   25 mg at 07/03/20 0816 ? ipratropium (ATROVENT) 0.02 % nebulizer solution 0.5 mg  0.5 mg Nebulization Q6H WA Mayme Genta, MD   0.5 mg at 07/03/20 1610 ? methylPREDNISolone PF (Solu-MEDROL PF) injection 40 mg  40 mg Intravenous Q12H Mayme Genta, MD   40 mg at 07/03/20 0816 ? pantoprazole (PROTONIX) EC tablet 40 mg  40 mg Oral Daily Mayme Genta, MD   40 mg at 07/02/20 2023 ? piperacillin-tazobactam (ZOSYN) 3.375 g in sodium chloride 0.9% 50 mL (mini-bag plus)  3.375 g Intravenous Q6H Altamese Cabal, MD 16.7 mL/hr at 07/03/20 0817 3.375 g at 07/03/20 0817 ? pregabalin (LYRICA) capsule 100 mg  100 mg Oral BID Mayme Genta, MD   100 mg at 07/03/20 0817 ? rosuvastatin (CRESTOR) tablet 40 mg  40 mg Oral Daily Mayme Genta, MD   40 mg at 07/03/20 0817 ? sodium chloride 0.9 % flush 3 mL  3 mL IV Push Q8H Mayme Genta, MD   3 mL at 07/03/20 0626 ? tiotropium-olodateroL (STIOLTO RESPIMAT) 2.5-2.5 mcg/actuation mist for inhalation 2 Inhalation  2 Inhalation Inhalation Daily Adrienne Mocha, MD     Continuous Infusions:PRN Meds:.HYDROmorphone, melatonin, sodium chlorideObjective: Vitals: Last 24 hours: Temp:  [97.4 ?F (36.3 ?C)-98.4 ?F (36.9 ?C)] 97.5 ?F (36.4 ?C)Pulse:  [80-104] 83Resp:  [20] 20BP: (95-135)/(53-76) 114/64SpO2:  [94 %-98 %] 94 % and O2 Sat and Device used: SpO2: 94 %Device (Oxygen Therapy): nasal cannulaI/O's:Gross Totals (Last 24 hours) at 07/03/2020 0850Last data filed at 07/02/2020 1046Intake 550 ml Output ? Net 550 ml Physical ExamGen:  Dyspneic with exertion--ambulating in roomNeuro: alert and orientedHEENT: NC/ATPulm: good air entry bilaterally, no wheezing today CV: RRRAbd: obese, less distendedExt:  Mild edemaLabs:Recent Labs   08/05/210709 08/06/210525 NA 141 141 K 3.4* 3.9 CL 104 108 CO2 30 29 BUN 20 20 CREATININE 0.89 0.80 GLU 123* 219* Recent Labs   08/05/210709 08/06/210525 WBC 16.8* 17.5* HGB 13.7 12.0* PLT 251 234  Recent Labs   08/05/210709 08/06/210525 INR 3.05* 3.44* Recent Labs   08/05/210709 08/05/210926 CRP 2.8*  --  DDIMER 0.36  --  BNPPRO 76.0  --  TROPONINI <0.015  --  PROCALCITON 0.09 0.16 Results for orders placed or performed during the hospital encounter of 07/02/20 Blood gas, arterial Result Value Ref Range  Site Right Brachial   pH Arterial 7.45 7.35 - 7.45 units  pCO2, Arterial 44 35 - 45 mmHg  pO2, Arterial 72 (L) 75 - 100 mmHg  Calculated HCO3, Arterial 30.1 (H) 20.0 - 26.0 mmol/L  Base Excess, Arterial 6 (H) -2 - 2 mmol/L  Patient Temperature 37.0 Celsius  O2 Sat, Arterial 93 (L) 95 - 97 %  Carboxyhemoglobin, Arterial 1.1 0.5 - 1.5 %  Methemoglobin, Arterial 0.5 0.0 - 1.5 %  Appliance Non Rebreather 15 LPM  Diagnostics:Cxr (portable)Result Date: 07/02/2020 Right lower lobe pneumonia with small parapneumonic effusion. Correlation with PA and lateral  radiograph is recommended when clinically feasible. Followup to ensure radiographic clearance is recommended. Reported and Signed by:  Sandi Raveling, MDAssessment: 69 y.o. with COPD/emphysematous type. Likely pneumonia, aspiration is possible Exam not suggetive of severe exacerbationHypoxic respiratory failure improvingH/o PE, supratherepeutic on warfarinRecommendations: Would change steroids to prednisone and just give 3 day burst of 40mg  dailystiolto 2 puffs daily if he will agree to it--this would be a good medication for himatrovent nebs can be prnContinue zosyn/azithromycin for now and f/u cultures if able. Continue AC for h/o PETitrate down oxygen as toleratedSigned:Gabryelle Whitmoyer Jimmie Molly, MD  Beeper: [id number] call with any questions.Dr. Larey Seat  is covering for pulmonary issues over the weekend. Please call with any questions or concerns. Dr. Billie Ruddy will take over our hospitalized patients next week.

## 2020-07-03 NOTE — Plan of Care
Problem: Adult Inpatient Plan of CareGoal: Readiness for Transition of CareOutcome: Initial problem identification Plan of Care Overview/ Patient Status    Met with pt at bedside, explained role of CM.Pt was admitted with dx of PNA, is receiving ivabx and on 5 l o2Pt lives alone in a [employer], states he does not use a cane or have home o2.He self catheterizes and obtains his supplies from [employer] in [place].He receives his medical care with Dr Jan Fireman states he drives.CM to continue to followDenice Maelyn Berrey, RN, CCMNurse Case ManagerGreenwich [phone]: [phone] CM Assessment and Discharge Planning    Most Recent Value Case Management Assessment Do you have a caregiver?  Yes Permission to Speak to Caregiver?  No Patient Requires Care Coordination Intervention Due To  discharge planning needs/concerns Arrived from prior to admission  home Bed Hold   n/a Services Prior to Admission  none ADL Assistance  None Type of Home Care Services  None What equipment do you currently use at home?  cane, straight Documented Insurance Accurate  Yes Any financial concerns related to anticipated discharge needs  No Patient's home address verified  Yes Patient's PCP of record verified  Yes Last Date Seen by PCP  6-12 months Living Environment  Lives With  Alone Current Living Arrangements  home/apartment/condo Transportation Available  family or friend will provide Home Safety Feels Safe Living In Home  yes Potentially Unsafe Housing Conditions  other (see comments) Source of Clinical History Patient's clinical history has been reviewed and source of Information is:  Patient Completed Assessment Completed Initial Assesment by: Name  Loann Quill, RN Role:  Discharge Planner CM/SW Attestation: Choose which ONE is appropriate for you I have reviewed the medical record and completed the above evaluation with the following recommendations.  Yes Discharge Planning Coordination Recommendations Discharge Planning Coordination Recommendations  Needs not determined at this time RN reviewed plan of care/ continuum of care need's with   Patient

## 2020-07-04 LAB — CBC WITH AUTO DIFFERENTIAL
BKR BUN / CREAT RATIO: 243 x1000/??L (ref 140–446)
BKR EGFR (AFR AMER): 16.8 x 1000/??L — ABNORMAL HIGH (ref 1.8–7.3)
BKR WAM ABSOLUTE IMMATURE GRANULOCYTES: 0.1 x 1000/??L (ref 0.0–0.2)
BKR WAM ABSOLUTE LYMPHOCYTE COUNT: 1.3 x 1000/??L (ref 1.0–2.3)
BKR WAM ABSOLUTE NRBC: 0 x 1000/??L — ABNORMAL LOW (ref <=0.0)
BKR WAM ANALYZER ANC: 16.8 x 1000/ÂµL — ABNORMAL HIGH (ref 1.8–7.3)
BKR WAM BASOPHIL ABSOLUTE COUNT: 0 x 1000/??L (ref 0.0–0.6)
BKR WAM BASOPHILS: 0.1 % (ref 0.0–2.0)
BKR WAM EOSINOPHIL ABSOLUTE COUNT: 0 x 1000/??L (ref 0.0–0.4)
BKR WAM EOSINOPHILS: 0 % — ABNORMAL HIGH (ref 0.0–6.0)
BKR WAM HEMATOCRIT: 40.1 % — ABNORMAL LOW (ref 41.0–54.0)
BKR WAM HEMOGLOBIN: 12.6 g/dL — ABNORMAL LOW (ref 13.7–18.0)
BKR WAM IMMATURE GRANULOCYTES: 0.7 % (ref 0.0–2.0)
BKR WAM LYMPHOCYTES: 6.7 % — ABNORMAL LOW (ref 14.0–43.0)
BKR WAM MCH (PG): 29.9 pg (ref 25.7–31.0)
BKR WAM MCHC: 31.4 g/dL — ABNORMAL LOW (ref 32.0–36.0)
BKR WAM MCV: 95 fL (ref 80.0–99.0)
BKR WAM MONOCYTE ABSOLUTE COUNT: 0.5 x 1000/??L — ABNORMAL HIGH (ref 0.4–1.3)
BKR WAM MONOCYTES: 2.6 % (ref 0.0–14.0)
BKR WAM MPV: 10.1 fL (ref 9.8–12.3)
BKR WAM NEUTROPHILS: 89.9 % — ABNORMAL HIGH (ref 38.0–74.0)
BKR WAM NUCLEATED RED BLOOD CELLS: 0 % (ref 0.0–0.2)
BKR WAM PLATELETS: 243 x1000/ÂµL (ref 140–446)
BKR WAM RDW-CV: 14.6 % — ABNORMAL HIGH (ref 11.5–14.5)
BKR WAM RED BLOOD CELL COUNT: 4.2 M/ÂµL — ABNORMAL LOW (ref 4.6–6.1)
BKR WAM WHITE BLOOD CELL COUNT: 18.7 x1000/??L — ABNORMAL HIGH (ref 3.8–10.6)

## 2020-07-04 LAB — ZZZURINALYSIS WITH CULTURE REFLEX     (L Q)
BKR BILIRUBIN, UA: NEGATIVE
BKR KETONES, UA: NEGATIVE
BKR LEUKOCYTE ESTERASE, UA: POSITIVE % — AB (ref 0.0–2.0)
BKR NITRITE, UA: NEGATIVE
BKR PH, UA: 6.5 (ref 5.5–7.5)
BKR SPECIFIC GRAVITY, UA: 1.023 g/dL (ref 1.005–1.030)
BKR UROBILINOGEN, UA: <2 EU/dL (ref <=2.0)

## 2020-07-04 LAB — PROTIME AND INR
BKR INR: 2.65 — ABNORMAL HIGH (ref 0.88–1.14)
BKR PROTHROMBIN TIME: 26.7 seconds — ABNORMAL HIGH (ref 9.4–12.0)

## 2020-07-04 LAB — LEGIONELLA AND S. PNEUMONIAE ANTIGEN, URINE     (BH GH LMW YH)
BKR LEGIONELLA ANTIGEN: NEGATIVE
BKR S. PNEUMONIAE URINE ANTIGEN: NEGATIVE

## 2020-07-04 LAB — BASIC METABOLIC PANEL
BKR ANION GAP: 7 (ref 5–18)
BKR BLOOD UREA NITROGEN: 20 mg/dL (ref 8–25)
BKR CALCIUM: 9 mg/dL (ref 8.4–10.3)
BKR CHLORIDE: 110 mmol/L (ref 95–115)
BKR CO2: 25 mmol/L — ABNORMAL LOW (ref 21–32)
BKR CREATININE: 0.94 mg/dL — ABNORMAL LOW (ref 0.50–1.30)
BKR EGFR (NON AFRICAN AMERICAN): >60 mL/min/1.73m2 — ABNORMAL HIGH (ref >60)
BKR GLUCOSE: 234 mg/dL — ABNORMAL HIGH (ref 70–100)
BKR OSMOLALITY CALCULATION: 293 mosm/kg (ref 275–295)
BKR POTASSIUM: 3.9 mmol/L (ref 3.5–5.1)
BKR SODIUM: 142 mmol/L (ref 136–145)

## 2020-07-04 LAB — URINE MICROSCOPIC     (BH GH LMW YH)
BKR HYALINE CASTS, UA INSTRUMENT (NUMERIC): 3 /LPF (ref 0–3)
BKR RBC/HPF INSTRUMENT: 1 /HPF (ref 0–2)
BKR WBC/HPF INSTRUMENT: 9 /HPF — ABNORMAL HIGH (ref 0–5)

## 2020-07-04 MED ORDER — IPRATROPIUM BROMIDE 0.02 % SOLUTION FOR INHALATION
0.02 % | Freq: Four times a day (QID) | RESPIRATORY_TRACT | Status: DC | PRN
Start: 2020-07-04 — End: 2020-07-05

## 2020-07-04 MED ORDER — ZZ IMS TEMPLATE
Freq: Once | ORAL | Status: CP
Start: 2020-07-04 — End: ?
  Administered 2020-07-05: 01:00:00 1 mg via ORAL

## 2020-07-04 MED ORDER — ACETAMINOPHEN 325 MG TABLET
325 mg | Freq: Four times a day (QID) | ORAL | Status: DC | PRN
Start: 2020-07-04 — End: 2020-07-05
  Administered 2020-07-05: 01:00:00 325 mg via ORAL

## 2020-07-04 MED ORDER — WARFARIN THERAPY PLACEHOLDER
ORAL | Status: DC
Start: 2020-07-04 — End: 2020-07-05

## 2020-07-04 NOTE — Plan of Care
Plan of Care Overview/ Patient Status    N: A+Ox4, OOB independentlyC: SR on monitor, VSSR: RA, refusing any oxygen, DOEGI/GU; tolerating diet, OOB to toilet independentlySkin: no issues

## 2020-07-04 NOTE — Progress Notes
[facility]	Progress Note  2Attending Provider: Lesly Dukes, MDSubjective: 24 hour events: No acute events overnight. On 3L O2Subjective: Patient was examined at bedside In no acute distress.Denies SOB, CP, ab pain, fever, chills. Reports improvement from hospital admission status. Meds: Scheduled Meds:Current Facility-Administered Medications Medication Dose Route Frequency Provider Last Rate Last Admin ? azithromycin (ZITHROMAX) 500 mg in sodium chloride 0.9% 250 mL IVPB (vialmate)  500 mg Intravenous Q24H Jannette Fogo, MD 250 mL/hr at 07/03/20 1321 500 mg at 07/03/20 1321 ? famotidine (PEPCID) tablet 20 mg  20 mg Oral BID Florencia Reasons Jacinto Halim., DO   20 mg at 07/04/20 1610 ? hydroCHLOROthiazide (HYDRODIURIL) tablet 25 mg  25 mg Oral Daily Mayme Genta, MD   25 mg at 07/04/20 9604 ? ipratropium (ATROVENT) 0.02 % nebulizer solution 0.5 mg  0.5 mg Nebulization Q6H WA Mayme Genta, MD   0.5 mg at 07/04/20 0815 ? piperacillin-tazobactam (ZOSYN) 4.5 g in sodium chloride 0.9% 100 mL (mini-bag plus)  4.5 g Intravenous Q6H Neta Mends, MD 33.3 mL/hr at 07/04/20 0902 4.5 g at 07/04/20 0902 ? predniSONE (DELTASONE) tablet 40 mg  40 mg Oral Daily Andrew Au., DO   40 mg at 07/04/20 5409 ? pregabalin (LYRICA) capsule 100 mg  100 mg Oral BID Mayme Genta, MD   100 mg at 07/04/20 8119 ? rosuvastatin (CRESTOR) tablet 40 mg  40 mg Oral Daily Mayme Genta, MD   40 mg at 07/04/20 1478 ? sodium chloride 0.9 % flush 3 mL  3 mL IV Push Q8H Mayme Genta, MD   3 mL at 07/04/20 0616 ? tiotropium-olodateroL (STIOLTO RESPIMAT) 2.5-2.5 mcg/actuation mist for inhalation 2 Inhalation  2 Inhalation Inhalation Daily Adrienne Mocha, MD   2 Inhalation at 07/04/20 0859 Continuous Infusions:PRN Meds:HYDROmorphone, melatonin, sodium chlorideObjective: Vitals:Temp:  [97.8 ?F (36.6 ?C)-98.2 ?F (36.8 ?C)] 97.8 ?F (36.6 ?C)Pulse:  [72-97] 97Resp:  [18-22] 19BP: (109-136)/(65-81) 127/81SpO2:  [92 %-96 %] 95 %Device (Oxygen Therapy): room airO2 Flow (L/min):  [3] 3  I/O's:Intake/Output Summary (Last 24 hours) at 07/04/2020 1022Last data filed at 07/03/2020 1800Gross per 24 hour Intake 250 ml Output 625 ml Net -375 ml  Physical Exam:Gen:  Well appearing, in no acute distressHEENT: PERRL, no scleral icterus, no conjunctival injectionCV: normal S1 and S2, regular rate and rythem, no murmurs gallops or rubs Pulm: decreased breath sounds in right lower lung field. No wheezing or rhonchi present, improved from admGI: Soft, tender in RUQ, distended, +BS. No guarding or rebound tenderness. Ext: No peripheral edemaNeuro: A&O x 3, EOM intact, face symmetric,. Normal bulk and tone. MSK: Full ROM. Skin: No rashesLabs: Recent Labs Lab 08/05/210709 08/06/210525 08/07/210435 WBC 16.8* 17.5* 18.7* HGB 13.7 12.0* 12.6* HCT 42.1 39.0* 40.1* PLT 251 234 243  Recent Labs Lab 08/05/210709 08/06/210525 08/07/210435 NEUTROPHILS 88.2* 92.3* 89.9*  Recent Labs Lab 08/05/210709 08/06/210525 08/07/210435 NA 141 141 142 K 3.4* 3.9 3.9 CL 104 108 110 CO2 30 29 25  BUN 20 20 20  CREATININE 0.89 0.80 0.94 GLU 123* 219* 234* ANIONGAP 7 4* 7  Recent Labs Lab 08/05/210709 08/06/210525 08/07/210435 CALCIUM 8.5 8.8 9.0  Recent Labs Lab 08/05/210709 ALT 108* AST 32 ALKPHOS 163* BILITOT 0.5  Recent Labs Lab 08/05/210709 08/06/210525 08/07/210435 LABPROT 30.3* 33.8* 26.7* INR 3.05* 3.44* 2.65*  Diagnostics/Radiology:Cxr (portable)Result Date: 8/5/2021Right lower lobe pneumonia with small parapneumonic effusion. Correlation with PA and lateral radiograph is recommended when clinically feasible. Followup to ensure radiographic clearance is recommended. Reported and Signed by:  Sandi Raveling, MDMicro:Results  for orders placed or performed during the hospital encounter of 07/02/20 Blood culture  Collection Time: 07/02/20  9:26 AM  Specimen: Peripheral; Blood Result Value  Blood Culture No Growth to Date Blood culture  Collection Time: 07/02/20  7:09 AM  Specimen: Peripheral; Blood Result Value  Blood Culture No Growth to Date Results for orders placed or performed during the hospital encounter of 06/11/20 Urine culture - Clinic Collect  Collection Time: 06/11/20 12:10 PM  Specimen: Straight Cath Urine; Culture Result Value  Urine Culture, Routine No Growth Results for orders placed or performed during the hospital encounter of 05/21/20 Lower respiratory culture, qualitative  Collection Time: 05/24/20  9:31 PM  Specimen: Sputum; Culture Result Value  Lower Respiratory Culture 1+ Staphylococcus aureus (A)  Gram Stain (Primary) 1+ WBC's  Gram Stain (Primary) Mixed flora     Susceptibility  Staphylococcus aureus -  (no method available)   Clindamycin  Resistant    Erythromycin  Resistant    Gentamicin <=0.5 Susceptible ug/mL   Oxacillin <=0.25 Susceptible ug/mL   Tetracycline <=1 Susceptible ug/mL   Trimethoprim + Sulfamethoxazole <=10 Susceptible ug/mL   Vancomycin <=0.5 Susceptible ug/mL Assessment & Plan: Consults: pulmonology?69 y.o.?male?PMHx of COPD, GERD, PE?on?warfarin, 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 Patient had two hospitalizations over the past month for COPD exacerbations, had required HFNC in last admission and was discharged on 7/24?with prednisone taper which he almost completed prior to this admission. No wheezing or productive cough noted. ABG no CO2 retention only hypoxemia. CXR indicated RLL PNA. Leukocytosis could be from new infection and/or steriods. Procal WNL, will f/u sputum Cx.?ProBNP neg. Last admission r/o'ed cardiac issues for dyspnea.??Impression is more likely to be PNA vs. Less likely COPD exacerbation given no overt wheezing/productive cough, no CO2 retention, on steriods and neb treatment prior to admission. Progression of PE less likely as pt was on [facility] and INR at goal. 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 ?INR 3.05 on admission, considered at therapeutic goal- cont to dose warfarin daily,- daily INR??#Hx HTN?BP stable-?c/w?PTA HCTZ??25- c/w PTA Crestor 40 mg daily?#Hx Chronic Pain -?c/w Pregablin 100 mg BID- diaudid 2mg  q6h PRN#GERD- c/w Pepcid 20??DIET: regularFLUIDS: POELECTROLYTES: replete PRNPPX: warfarinACCESS: PIVDISPO: teleCODE STATUS: full codeSigned:Neta Mends, MD, PGY-1MHB: [phone] 7, 202110:22 AM

## 2020-07-04 NOTE — Plan of Care
Plan of Care Overview/ Patient Status    Received in bed, patient is AOx4, OOB independently. Monitor shows SR. V/s stable. Patient does straight cath by himself.Slept well. SOB noted on exertion.  Needs attended.

## 2020-07-04 NOTE — Progress Notes
[facility]	Progress Note  1Attending Provider: Lesly Dukes, MDSubjective: 24 hour events:Overnight patient was admitted due to SOB and hypoxia while in ED. Subjective:This morning he was examined at bedside. He was in no acute distress at this time. He denied shortness of breath, chest pain, abdominal pain, fever or chills. He did report that he is feeling better today than he was yesterday. Meds: Scheduled Meds:Current Facility-Administered Medications Medication Dose Route Frequency Provider Last Rate Last Admin ? azithromycin (ZITHROMAX) 500 mg in sodium chloride 0.9% 250 mL IVPB (vialmate)  500 mg Intravenous Q24H Jannette Fogo, MD 250 mL/hr at 07/02/20 1418 500 mg at 07/02/20 1418 ? famotidine (PEPCID) tablet 20 mg  20 mg Oral BID Florencia Reasons Jacinto Halim., DO     ? hydroCHLOROthiazide (HYDRODIURIL) tablet 25 mg  25 mg Oral Daily Mayme Genta, MD   25 mg at 07/03/20 0816 ? ipratropium (ATROVENT) 0.02 % nebulizer solution 0.5 mg  0.5 mg Nebulization Q6H WA Mayme Genta, MD   0.5 mg at 07/03/20 1660 ? methylPREDNISolone PF (Solu-MEDROL PF) injection 40 mg  40 mg Intravenous Q12H Mayme Genta, MD   40 mg at 07/03/20 0816 ? piperacillin-tazobactam (ZOSYN) 3.375 g in sodium chloride 0.9% 50 mL (mini-bag plus)  3.375 g Intravenous Q6H Altamese Cabal, MD 16.7 mL/hr at 07/03/20 0817 3.375 g at 07/03/20 0817 ? [START ON 07/04/2020] predniSONE (DELTASONE) tablet 40 mg  40 mg Oral Daily Florencia Reasons Jacinto Halim., DO     ? pregabalin (LYRICA) capsule 100 mg  100 mg Oral BID Mayme Genta, MD   100 mg at 07/03/20 0817 ? rosuvastatin (CRESTOR) tablet 40 mg  40 mg Oral Daily Mayme Genta, MD   40 mg at 07/03/20 0817 ? sodium chloride 0.9 % flush 3 mL  3 mL IV Push Q8H Mayme Genta, MD   3 mL at 07/03/20 0626 ? tiotropium-olodateroL (STIOLTO RESPIMAT) 2.5-2.5 mcg/actuation mist for inhalation 2 Inhalation  2 Inhalation Inhalation Daily Adrienne Mocha, MD     Continuous Infusions:PRN Meds:HYDROmorphone, melatonin, sodium chlorideObjective: Vitals:Temp:  [97.4 ?F (36.3 ?C)-98.4 ?F (36.9 ?C)] 97.8 ?F (36.6 ?C)Pulse:  [80-99] 89Resp:  [20] 20BP: (95-129)/(53-76) 129/76SpO2:  [93 %-98 %] 93 %Device (Oxygen Therapy): nasal cannulaO2 Flow (L/min):  [3-5] 3 on 3L I/O's:No intake or output data in the 24 hours ending 07/03/20 1255 Physical Exam:Gen:  Well appearing, in no acute distressHEENT: PERRL, no scleral icterus, no conjunctival injectionCV: normal S1 and S2, regular rate and rythem, no murmurs gallops or rubs Pulm: decreased breath sounds in right lower lung field. No wheezing or rhonchi presentGI: Soft, tender in RUQ, distended, +BS. No guarding or rebound tenderness. Ext: No peripheral edemaNeuro: A&O x 3, EOM intact, face symmetric,. Normal bulk and tone. MSK: Full ROM. Skin: No rashesLabs: Recent Labs Lab 08/05/210709 08/06/210525 WBC 16.8* 17.5* HGB 13.7 12.0* HCT 42.1 39.0* PLT 251 234  Recent Labs Lab 08/05/210709 08/06/210525 NEUTROPHILS 88.2* 92.3*  Recent Labs Lab 08/05/210709 08/06/210525 NA 141 141 K 3.4* 3.9 CL 104 108 CO2 30 29 BUN 20 20 CREATININE 0.89 0.80 GLU 123* 219* ANIONGAP 7 4*  Recent Labs Lab 08/05/210709 08/06/210525 CALCIUM 8.5 8.8  Recent Labs Lab 08/05/210709 ALT 108* AST 32 ALKPHOS 163* BILITOT 0.5  Recent Labs Lab 08/05/210709 08/06/210525 LABPROT 30.3* 33.8* INR 3.05* 3.44*  Diagnostics/Radiology:Chest x-ray 8/5/21Results-Right lower lobe pneumonia with small parapneumonic effusion. Correlation with PA and lateral radiograph is recommended when clinically feasible. Followup to ensure radiographic clearance is recommended.?Micro:ECG/Tele Events: 8/5/21ResultsResults for orders placed  or performed during the hospital encounter of 07/02/20 EKG  Collection Time: 07/02/20  8:41 AM Result Value Ref Range  Heart Rate 119 bpm  QRS Duration 86 ms  Q-T Interval 324 ms  QTC Calculation(Bezet) 455 ms  P Axis 40 deg  R Axis 0 deg  T Axis 34 deg  P-R Interval 156 msec  ECG - SEVERITY Abnormal ECG severity Assessment & Plan: Attending Provider: Lesly Dukes, MD [phone]: pulmonology69 y.o. 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 Patient had two hospitalizations over the past month for COPD exacerbations, had required HFNC in last admission and was discharged on 7/24 with prednisone taper which he almost completed prior to this admission. No wheezing or productive cough noted. ABG no CO2 retention only hypoxemia. CXR indicated RLL PNA. Leukocytosis could be from new infection and/or steriods. Procal WNL, will f/u sputum Cx. ProBNP neg. Last admission r/o'ed cardiac issues for dyspnea.  Impression is more likely to be PNA vs. Less likely COPD exacerbation given no overt wheezing/productive cough, no CO2 retention, on steriods and neb treatment prior to admission. Progression of PE less likely as pt was on [facility] and INR at goal. 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?- has been on prednisone taper since discharged 7/24, s/p dexamethosone 10 mg this AM in ED; - started Zosyn 3.375g q6 and Zithromax 500mg  daily, given high suspicion for inf and recent hospitalization- c/w Duo-neb q6h WA as PTA- proBNP neg, last Echo was unremarkable- Symbicort was d/c'ed last discharge- procal neg x2; f/u on sputum culture- f/u resp viral panel- given NS 500 ml bolus in ED- trend fever curve, WBC, CRP- Cont atrovent neb- start prednisone 40mg  daily per Pulmonary rec?- start Stiolto per pulmonary rec# 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  INR 3.05 on admission, considered at therapeutic goal- cont to dose warfarin daily,- daily INR ??#Hx HTN BP stable- c/w PTA HCTZ  - c/w PTA Crestor 40 mg daily?#Hx Chronic Pain - c/w Pregablin 100 mg BID- diaudid 2mg  q6h PRN?DIET: regularFLUIDS: POELECTROLYTES: replete PRNPPX: warfarinACCESS: PIVDISPO: teleCODE STATUS: full codeElectronically Signed:Dr. Florencia Reasons, MD PGY-1Date: 8/6/2021Time: 12:55 PMPager # [employer] # [phone]

## 2020-07-05 DIAGNOSIS — K219 Gastro-esophageal reflux disease without esophagitis: Secondary | ICD-10-CM

## 2020-07-05 DIAGNOSIS — Z888 Allergy status to other drugs, medicaments and biological substances status: Secondary | ICD-10-CM

## 2020-07-05 DIAGNOSIS — Z79891 Long term (current) use of opiate analgesic: Secondary | ICD-10-CM

## 2020-07-05 DIAGNOSIS — I10 Essential (primary) hypertension: Secondary | ICD-10-CM

## 2020-07-05 DIAGNOSIS — Z7952 Long term (current) use of systemic steroids: Secondary | ICD-10-CM

## 2020-07-05 DIAGNOSIS — G8929 Other chronic pain: Secondary | ICD-10-CM

## 2020-07-05 DIAGNOSIS — Z9889 Other specified postprocedural states: Secondary | ICD-10-CM

## 2020-07-05 DIAGNOSIS — Z86711 Personal history of pulmonary embolism: Secondary | ICD-10-CM

## 2020-07-05 DIAGNOSIS — J90 Pleural effusion, not elsewhere classified: Secondary | ICD-10-CM

## 2020-07-05 DIAGNOSIS — J9691 Respiratory failure, unspecified with hypoxia: Secondary | ICD-10-CM

## 2020-07-05 DIAGNOSIS — Z7901 Long term (current) use of anticoagulants: Secondary | ICD-10-CM

## 2020-07-05 DIAGNOSIS — Z8711 Personal history of peptic ulcer disease: Secondary | ICD-10-CM

## 2020-07-05 DIAGNOSIS — Z85828 Personal history of other malignant neoplasm of skin: Secondary | ICD-10-CM

## 2020-07-05 DIAGNOSIS — Z9114 Patient's other noncompliance with medication regimen: Secondary | ICD-10-CM

## 2020-07-05 DIAGNOSIS — Z981 Arthrodesis status: Secondary | ICD-10-CM

## 2020-07-05 DIAGNOSIS — E669 Obesity, unspecified: Secondary | ICD-10-CM

## 2020-07-05 DIAGNOSIS — Z5320 Procedure and treatment not carried out because of patient's decision for unspecified reasons: Secondary | ICD-10-CM

## 2020-07-05 DIAGNOSIS — Z885 Allergy status to narcotic agent status: Secondary | ICD-10-CM

## 2020-07-05 DIAGNOSIS — Z20822 Contact with and (suspected) exposure to covid-19: Secondary | ICD-10-CM

## 2020-07-05 DIAGNOSIS — Z79899 Other long term (current) drug therapy: Secondary | ICD-10-CM

## 2020-07-05 DIAGNOSIS — J690 Pneumonitis due to inhalation of food and vomit: Secondary | ICD-10-CM

## 2020-07-05 DIAGNOSIS — Z87891 Personal history of nicotine dependence: Secondary | ICD-10-CM

## 2020-07-05 DIAGNOSIS — E7800 Pure hypercholesterolemia, unspecified: Secondary | ICD-10-CM

## 2020-07-05 DIAGNOSIS — J439 Emphysema, unspecified: Secondary | ICD-10-CM

## 2020-07-05 DIAGNOSIS — Z6833 Body mass index (BMI) 33.0-33.9, adult: Secondary | ICD-10-CM

## 2020-07-05 LAB — PROTIME AND INR
BKR INR: 1.49 — ABNORMAL HIGH (ref 0.88–1.14)
BKR PROTHROMBIN TIME: 15.8 s — ABNORMAL HIGH (ref 9.4–12.0)

## 2020-07-05 LAB — BASIC METABOLIC PANEL
BKR ANION GAP: 6 (ref 5–18)
BKR BLOOD UREA NITROGEN: 27 mg/dL — ABNORMAL HIGH (ref 8–25)
BKR BUN / CREAT RATIO: 29 — ABNORMAL HIGH (ref 8.0–25.0)
BKR CALCIUM: 8.9 mg/dL (ref 8.4–10.3)
BKR CHLORIDE: 109 mmol/L (ref 95–115)
BKR CO2: 28 mmol/L (ref 21–32)
BKR CREATININE: 0.93 mg/dL — ABNORMAL LOW (ref 0.50–1.30)
BKR EGFR (AFR AMER): >60 mL/min/{1.73_m2} (ref >60)
BKR EGFR (NON AFRICAN AMERICAN): >60 mL/min/1.73m2 (ref >60)
BKR GLUCOSE: 143 mg/dL — ABNORMAL HIGH (ref 70–100)
BKR OSMOLALITY CALCULATION: 293 mOsm/kg (ref 275–295)
BKR POTASSIUM: 3.5 mmol/L (ref 3.5–5.1)
BKR SODIUM: 143 mmol/L (ref 136–145)
BKR WAM WHITE BLOOD CELL COUNT: 109 mmol/L — ABNORMAL HIGH (ref 95–115)

## 2020-07-05 LAB — CBC WITH AUTO DIFFERENTIAL
BKR WAM ABSOLUTE IMMATURE GRANULOCYTES: 0.1 x 1000/??L (ref 0.0–0.2)
BKR WAM ABSOLUTE LYMPHOCYTE COUNT: 2.7 x 1000/??L — ABNORMAL HIGH (ref 1.0–2.3)
BKR WAM ABSOLUTE NRBC: 0 x 1000/??L (ref <=0.0)
BKR WAM ANALYZER ANC: 8.2 x 1000/??L — ABNORMAL HIGH (ref 1.8–7.3)
BKR WAM BASOPHIL ABSOLUTE COUNT: 0 x 1000/??L (ref 0.0–0.6)
BKR WAM BASOPHILS: 0.1 % (ref 0.0–2.0)
BKR WAM EOSINOPHIL ABSOLUTE COUNT: 0 x 1000/??L (ref 0.0–0.4)
BKR WAM EOSINOPHILS: 0.2 % (ref 0.0–6.0)
BKR WAM HEMATOCRIT: 40.4 % — ABNORMAL LOW (ref 41.0–54.0)
BKR WAM HEMOGLOBIN: 12.8 g/dL — ABNORMAL LOW (ref 13.7–18.0)
BKR WAM IMMATURE GRANULOCYTES: 0.9 % (ref 0.0–2.0)
BKR WAM LYMPHOCYTES: 23.6 % (ref 14.0–43.0)
BKR WAM MCH (PG): 29.5 pg (ref 25.7–31.0)
BKR WAM MCHC: 31.7 g/dL — ABNORMAL LOW (ref 32.0–36.0)
BKR WAM MCV: 93.1 fL (ref 80.0–99.0)
BKR WAM MONOCYTE ABSOLUTE COUNT: 0.5 x 1000/??L (ref 0.4–1.3)
BKR WAM MONOCYTES: 4.4 % (ref 0.0–14.0)
BKR WAM MPV: 10.6 fL (ref 9.8–12.3)
BKR WAM NEUTROPHILS: 70.8 % (ref 38.0–74.0)
BKR WAM NUCLEATED RED BLOOD CELLS: 0 % (ref 0.0–0.2)
BKR WAM PLATELETS: 218 x1000/??L — ABNORMAL HIGH (ref 140–446)
BKR WAM RDW-CV: 14.5 % (ref 11.5–14.5)
BKR WAM RED BLOOD CELL COUNT: 4.3 M/ÂµL — ABNORMAL LOW (ref 4.6–6.1)

## 2020-07-05 LAB — MRSA CULTURE     (BH GH LM YH): BKR MRSA MEDIA: NEGATIVE

## 2020-07-05 LAB — URINE CULTURE: BKR URINE CULTURE, ROUTINE: NO GROWTH /HPF

## 2020-07-05 LAB — PROCALCITONIN     (BH GH LMW Q YH): BKR PROCALCITONIN: 0.06 ng/mL

## 2020-07-05 MED ORDER — PREDNISONE 20 MG TABLET
20 mg | ORAL_TABLET | Freq: Every day | ORAL | 1 refills | Status: AC
Start: 2020-07-05 — End: ?

## 2020-07-05 MED ORDER — AMOXICILLIN 875 MG-POTASSIUM CLAVULANATE 125 MG TABLET
875-125 mg | ORAL_TABLET | Freq: Two times a day (BID) | ORAL | 1 refills | Status: AC
Start: 2020-07-05 — End: ?

## 2020-07-05 MED ORDER — PREDNISONE 10 MG TABLET
10 mg | ORAL_TABLET | Freq: Every day | ORAL | 1 refills | Status: AC
Start: 2020-07-05 — End: ?

## 2020-07-05 MED ORDER — ROSUVASTATIN 40 MG TABLET
40 mg | ORAL_TABLET | Freq: Every day | ORAL | 12 refills | Status: AC
Start: 2020-07-05 — End: ?

## 2020-07-05 MED ORDER — PREDNISONE 10 MG TABLET
10 mg | Freq: Every day | ORAL | Status: DC
Start: 2020-07-05 — End: 2020-07-05

## 2020-07-05 MED ORDER — AMOXICILLIN 875 MG-POTASSIUM CLAVULANATE 125 MG TABLET
875-125 mg | Freq: Two times a day (BID) | ORAL | Status: DC
Start: 2020-07-05 — End: 2020-07-05

## 2020-07-05 MED ORDER — PREDNISONE 20 MG TABLET
20 mg | Freq: Every day | ORAL | Status: DC
Start: 2020-07-05 — End: 2020-07-05

## 2020-07-05 MED ORDER — PREDNISONE 10 MG TABLET
10 mg | ORAL_TABLET | Freq: Every day | ORAL | 1 refills | Status: AC
Start: 2020-07-05 — End: 2020-07-20

## 2020-07-05 MED ORDER — ZZ IMS TEMPLATE
Freq: Every day | ORAL | Status: DC
Start: 2020-07-05 — End: 2020-07-05

## 2020-07-05 NOTE — Progress Notes
[facility]	Progress Note  2Attending Provider: Lesly Dukes, MDSubjective: 24 hour events: No acute events overnight. On 3L O2Subjective: Patient was examined at bedside In no acute distress.Denies SOB, CP, ab pain, fever, chills. Reports improvement from hospital admission status. Meds: Scheduled Meds:Current Facility-Administered Medications Medication Dose Route Frequency Provider Last Rate Last Admin ? azithromycin (ZITHROMAX) 500 mg in sodium chloride 0.9% 250 mL IVPB (vialmate)  500 mg Intravenous Q24H Jannette Fogo, MD 250 mL/hr at 07/04/20 1318 500 mg at 07/04/20 1318 ? famotidine (PEPCID) tablet 20 mg  20 mg Oral BID Florencia Reasons Jacinto Halim., DO   20 mg at 07/04/20 2034 ? hydroCHLOROthiazide (HYDRODIURIL) tablet 25 mg  25 mg Oral Daily Mayme Genta, MD   25 mg at 07/04/20 1610 ? piperacillin-tazobactam (ZOSYN) 4.5 g in sodium chloride 0.9% 100 mL (mini-bag plus)  4.5 g Intravenous Q6H Neta Mends, MD 33.3 mL/hr at 07/04/20 1936 4.5 g at 07/04/20 1936 ? predniSONE (DELTASONE) tablet 40 mg  40 mg Oral Daily Andrew Au., DO   40 mg at 07/04/20 9604 ? pregabalin (LYRICA) capsule 100 mg  100 mg Oral BID Mayme Genta, MD   100 mg at 07/04/20 2034 ? rosuvastatin (CRESTOR) tablet 40 mg  40 mg Oral Daily Mayme Genta, MD   40 mg at 07/04/20 5409 ? sodium chloride 0.9 % flush 3 mL  3 mL IV Push Q8H Mayme Genta, MD   3 mL at 07/04/20 2039 ? tiotropium-olodateroL (STIOLTO RESPIMAT) 2.5-2.5 mcg/actuation mist for inhalation 2 Inhalation  2 Inhalation Inhalation Daily Adrienne Mocha, MD   2 Inhalation at 07/04/20 [phone] ? Warfarin Therapy Placeholder   Oral placeholder Clydie Braun, MD     Continuous Infusions:PRN Meds:acetaminophen, HYDROmorphone, ipratropium, melatonin, sodium chlorideObjective: Vitals:Temp:  [97.8 ?F (36.6 ?C)-98.5 ?F (36.9 ?C)] 98 ?F (36.7 ?C)Pulse: [69-97] 89Resp:  [18-22] 22BP: (109-143)/(65-83) 123/83SpO2:  [92 %-97 %] 96 %Device (Oxygen Therapy): room air  I/O's:No intake or output data in the 24 hours ending 07/04/20 2250 Physical Exam:Gen:  Well appearing, in no acute distressHEENT: PERRL, no scleral icterus, no conjunctival injectionCV: normal S1 and S2, regular rate and rythem, no murmurs gallops or rubs Pulm: decreased breath sounds in right lower lung field. No wheezing or rhonchi present, improved from admGI: Soft, tender in RUQ, distended, +BS. No guarding or rebound tenderness. Ext: No peripheral edemaNeuro: A&O x 3, EOM intact, face symmetric,. Normal bulk and tone. MSK: Full ROM. Skin: No rashesLabs: Recent Labs Lab 08/05/210709 08/06/210525 08/07/210435 WBC 16.8* 17.5* 18.7* HGB 13.7 12.0* 12.6* HCT 42.1 39.0* 40.1* PLT 251 234 243  Recent Labs Lab 08/05/210709 08/06/210525 08/07/210435 NEUTROPHILS 88.2* 92.3* 89.9*  Recent Labs Lab 08/05/210709 08/06/210525 08/07/210435 NA 141 141 142 K 3.4* 3.9 3.9 CL 104 108 110 CO2 30 29 25  BUN 20 20 20  CREATININE 0.89 0.80 0.94 GLU 123* 219* 234* ANIONGAP 7 4* 7  Recent Labs Lab 08/05/210709 08/06/210525 08/07/210435 CALCIUM 8.5 8.8 9.0  Recent Labs Lab 08/05/210709 ALT 108* AST 32 ALKPHOS 163* BILITOT 0.5  Recent Labs Lab 08/05/210709 08/06/210525 08/07/210435 LABPROT 30.3* 33.8* 26.7* INR 3.05* 3.44* 2.65*  Diagnostics/Radiology:Cxr (portable)Result Date: 8/5/2021Right lower lobe pneumonia with small parapneumonic effusion. Correlation with PA and lateral radiograph is recommended when clinically feasible. Followup to ensure radiographic clearance is recommended. Reported and Signed by:  Sandi Raveling, MDMicro:Results for orders placed or performed during the hospital encounter of 07/02/20 Blood culture  Collection Time: 07/02/20  9:26 AM Specimen: Peripheral; Blood Result Value  Blood  Culture No Growth to Date Blood culture  Collection Time: 07/02/20  7:09 AM  Specimen: Peripheral; Blood Result Value  Blood Culture No Growth to Date Results for orders placed or performed during the hospital encounter of 07/02/20 Urine culture  Collection Time: 07/04/20 12:22 AM  Specimen: Urine Result Value  Urine Culture, Routine No Growth to Date Results for orders placed or performed during the hospital encounter of 05/21/20 Lower respiratory culture, qualitative  Collection Time: 05/24/20  9:31 PM  Specimen: Sputum; Culture Result Value  Lower Respiratory Culture 1+ Staphylococcus aureus (A)  Gram Stain (Primary) 1+ WBC's  Gram Stain (Primary) Mixed flora     Susceptibility  Staphylococcus aureus -  (no method available)   Clindamycin  Resistant    Erythromycin  Resistant    Gentamicin <=0.5 Susceptible ug/mL   Oxacillin <=0.25 Susceptible ug/mL   Tetracycline <=1 Susceptible ug/mL   Trimethoprim + Sulfamethoxazole <=10 Susceptible ug/mL   Vancomycin <=0.5 Susceptible ug/mL Assessment & Plan: Consults: pulmonology?69 y.o.?male?PMHx of COPD, GERD, PE?on?warfarin, 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 Patient had two hospitalizations over the past month for COPD exacerbations, had required HFNC in last admission and was discharged on 7/24?with prednisone taper which he almost completed prior to this admission. No wheezing or productive cough noted. ABG no CO2 retention only hypoxemia. CXR indicated RLL PNA. Leukocytosis could be from new infection and/or steriods. Procal WNL, will f/u sputum Cx.?ProBNP neg. Last admission r/o'ed cardiac issues for dyspnea.??Impression is more likely to be PNA vs. Less likely COPD exacerbation given no overt wheezing/productive cough, no CO2 retention, on steriods and neb treatment prior to admission. Progression of PE less likely as pt was on [facility] and INR at goal. 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 ?INR 3.05 on admission, considered at therapeutic goal- cont to dose warfarin daily,- daily INR??#Hx HTN?BP stable-?c/w?PTA HCTZ??25- c/w PTA Crestor 40 mg daily?#Hx Chronic Pain -?c/w Pregablin 100 mg BID- diaudid 2mg  q6h PRN#GERD- c/w Pepcid 20??DIET: regularFLUIDS: POELECTROLYTES: replete PRNPPX: warfarinACCESS: PIVDISPO: teleCODE STATUS: full codeElectronically Signed:Dr. Florencia Reasons, MD PGY-1Date: 8/7/2021Time: 10:51 PMPager # [employer] # [phone]

## 2020-07-05 NOTE — Plan of Care
Plan of Care Overview/ Patient Status   Received in bed, patient is AOx4, OOB independently. Monitor shows SR. V/s stable. Patient does straight cath by himself.Slept well. SOB noted on exertion.  On RA spo2 stable.Needs attended.
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
## 2020-07-05 NOTE — Plan of Care
Plan of Care Overview/ Patient Status    1200[name] was discharged via Taxi accompanied by Alone.  Verbalized understanding of discharge instructionsand recommended follow up care as per the after visit summary.  Written discharge instructions provided. Denies any further questions. Heplock removed. RTM box returned to tele monitor room. Vital signs    Vitals:  07/04/20 2013 07/04/20 2357 07/05/20 0513 07/05/20 0812 BP: 123/83 (!) 145/91 137/80 (!) 149/92 Pulse: 89 69 70 71 Resp:  20 20 20  Temp: 98 ?F (36.7 ?C) 97.5 ?F (36.4 ?C) 97.5 ?F (36.4 ?C) 97.2 ?F (36.2 ?C) TempSrc: Oral Oral Oral  SpO2: 96% 95% 94% 96% Weight:    110.3 kg Height:     Problem: Adult Inpatient Plan of CareGoal: Plan of Care Review8/06/2020 1150 by Buren Kos, RNOutcome: Interventions implemented as appropriate8/06/2020 0927 by Buren Kos, RNOutcome: Interventions implemented as appropriateGoal: Patient-Specific Goal (Individualized)07/05/2020 1150 by Buren Kos, RNOutcome: Interventions implemented as appropriate8/06/2020 0927 by Buren Kos, RNOutcome: Interventions implemented as appropriateGoal: Absence of Hospital-Acquired Illness or Injury8/06/2020 1150 by Juel Burrow, Charmagne Buhl, RNOutcome: Interventions implemented as appropriate8/06/2020 0927 by Buren Kos, RNOutcome: Interventions implemented as appropriateGoal: Optimal Comfort and Wellbeing8/06/2020 1150 by Juel Burrow, Lanore Renderos, RNOutcome: Interventions implemented as appropriate8/06/2020 0927 by Buren Kos, RNOutcome: Interventions implemented as appropriateGoal: Readiness for Transition of Care8/06/2020 1150 by Juel Burrow, Mahonri Seiden, RNOutcome: Interventions implemented as appropriate8/06/2020 0927 by Buren Kos, RNOutcome: Interventions implemented as appropriate Problem: InfectionGoal: Infection Symptom Resolution8/06/2020 1150 by Buren Kos, RNOutcome: Interventions implemented as appropriate8/06/2020 0927 by Buren Kos, RNOutcome: Interventions implemented as appropriate Problem: Skin Injury Risk IncreasedGoal: Skin Health and Integrity8/06/2020 1150 by Buren Kos, RNOutcome: Interventions implemented as appropriate8/06/2020 0927 by Buren Kos, RNOutcome: Interventions implemented as appropriate Problem: Fall Injury RiskGoal: Absence of Fall and Fall-Related Injury8/06/2020 1150 by Juel Burrow, Sheliah Fiorillo, RNOutcome: Interventions implemented as appropriate8/06/2020 0927 by Buren Kos, RNOutcome: Interventions implemented as appropriate

## 2020-07-05 NOTE — Plan of Care
Plan of Care Overview/ Patient Status    0700: Received pt at bedside handoff report, alert and oriented x 4. Lung sounds clear, on RA, denied sob/chest pain. NSR on the monitor. Tolerating regular diet, no nausea/vomiting. Self straight cath PRN with own supply. Abdominal rounded, +BS x 4. 2+pedal pulses, denied numbness/tingling. OOB ambulate independently in the room/hall with steady gait. Call bell within reach. Will continue to monitor. Problem: Adult Inpatient Plan of CareGoal: Plan of Care ReviewOutcome: Interventions implemented as appropriateGoal: Patient-Specific Goal (Individualized)Outcome: Interventions implemented as appropriateGoal: Absence of Hospital-Acquired Illness or InjuryOutcome: Interventions implemented as appropriateGoal: Optimal Comfort and WellbeingOutcome: Interventions implemented as appropriateGoal: Readiness for Transition of CareOutcome: Interventions implemented as appropriate Problem: InfectionGoal: Infection Symptom ResolutionOutcome: Interventions implemented as appropriate Problem: Skin Injury Risk IncreasedGoal: Skin Health and IntegrityOutcome: Interventions implemented as appropriate Problem: Fall Injury RiskGoal: Absence of Fall and Fall-Related InjuryOutcome: Interventions implemented as appropriate

## 2020-07-05 NOTE — Discharge Instructions
You were admitted with pnuemonia for which you were treated with antibotics. You underwent the following imaging studies Chest x-ray , which showed pnuemonia. You are now stable for discharge but please pay careful attention to the following instructions listed belowInstructions from your Internal Medicine Team:1.  Please make an appointment to follow up with your primary care provider within 10 days for after hospital stay follow up2. Please make an appointment to follow up with pulmonologist Dr. Adrienne Mocha for COPD and pneumonia 3. Please continue to take your medications as direct and take note of medications that were added or changed during your hospital stay below :Current Discharge Medication List  START taking these medications  Details amoxicillin-clavulanate (AUGMENTIN) 875-125 mg per tablet Take 1 tablet by mouth every 12 (twelve) hours for 10 days.Qty: 20 tablet, Refills: 0Start date: 07/06/2020, End date: 07/16/2020   CONTINUE these medications which have CHANGED  Details !! predniSONE (DELTASONE) 10 mg tablet Take 3 tablets (30 mg total) by mouth daily for 3 days. Take with food.Qty: 9 tablet, Refills: 0Start date: 07/08/2020, End date: 07/11/2020  !! predniSONE (DELTASONE) 10 mg tablet Take 1 tablet (10 mg total) by mouth daily for 10 days. Take with food.Qty: 10 tablet, Refills: 0Start date: 07/14/2020, End date: 07/24/2020  !! predniSONE (DELTASONE) 20 mg tablet Take 2 tablets (40 mg total) by mouth daily for 2 days. Take with food.Qty: 4 tablet, Refills: 0Start date: 07/06/2020, End date: 07/08/2020  !! predniSONE (DELTASONE) 20 mg tablet Take 1 tablet (20 mg total) by mouth daily for 3 days. Take with food.Qty: 3 tablet, Refills: 0Start date: 07/11/2020, End date: 07/14/2020  rosuvastatin (CRESTOR) 40 mg tablet Take 1 tablet (40 mg total) by mouth daily.Qty: 30 tablet, Refills: 11Start date: 07/06/2020, End date: 07/06/2021   !! - Potential duplicate medications found. Please discuss with provider.  CONTINUE these medications which have NOT CHANGED  Details ascorbic acid, vitamin C, (VITAMIN C) 500 mg tablet Take 500 mg by mouth 2 (two) times daily.   baclofen (LIORESAL) 5 mg tablet Take 5 mg by mouth 2 (two) times daily as needed.  cholecalciferol (VITAMIN D-3) 50 mcg (2,000 unit) capsule Take 2,000 Units by mouth 2 (two) times daily.   enoxaparin (LOVENOX) 120 mg/0.8 mL subcutaneous syringe Inject 0.8 mLs (120 mg total) under the skin every 12 (twelve) hours.Qty: 6 Syringe, Refills: 0  hydroCHLOROthiazide (HYDRODIURIL) 25 mg tablet Take 1 tablet (25 mg total) by mouth daily.Qty: 30 tablet, Refills: 0  Associated Diagnoses: Essential hypertension  HYDROmorphone (DILAUDID) 4 mg tablet Take 4 mg by mouth every 6 (six) hours as needed (Pain).  ipratropium-albuteroL (DUO-NEB) 0.5 mg-3 mg(2.5 mg base)/3 mL nebulizer solution Take 3 mLs by nebulization every 6 (six) hours as needed for wheezing.Qty: 180 mL, Refills: 0  levalbuterol (XOPENEX) 45 mcg/actuation HFA aerosol inhaler Inhale 1-2 puffs into the lungs every 4 (four) hours as needed for wheezing.Qty: 15 g, Refills: 0  melatonin 5 mg tablet Take 5-10 mg by mouth nightly as needed (Sleep).   pantoprazole (PROTONIX) 40 mg tablet Take 1 tablet (40 mg total) by mouth daily.Qty: 90 tablet, Refills: 2  Associated Diagnoses: Gastroesophageal reflux disease without esophagitis  Potassium chloride SA (K-DUR,KLOR-CON M) 10 MEQ 24 hr tablet Take 1 tablet (10 mEq total) by mouth daily. Swallow whole; do not break, crush, or chew.Qty: 7 tablet, Refills: 0  pregabalin (LYRICA) 100 mg capsule Take 100 mg by mouth 2 (two) times daily.  senna (SENOKOT) 8.6 mg tablet Take 1 tablet by  mouth daily.  warfarin (COUMADIN) 5 mg tablet TAKE 1 TABLET BY MOUTH EVERY NIGHTQty: 90 tablet, Refills: 0   STOP taking these medications famotidine (PEPCID) 20 mg tablet    Should you begin experiencing worsening of your symptoms or new symptoms (e.g. fever, chills, shortness of breath, chest pain), please call your primary care physician or return to the emergency department.Thank you for allowing [place] to take part in your care. We wish you a speedy recovery.

## 2020-07-06 ENCOUNTER — Telehealth: Admit: 2020-07-06 | Payer: PRIVATE HEALTH INSURANCE | Attending: Medical Oncology | Primary: Family Medicine

## 2020-07-06 ENCOUNTER — Telehealth: Admit: 2020-07-06 | Payer: PRIVATE HEALTH INSURANCE | Primary: Family Medicine

## 2020-07-06 ENCOUNTER — Inpatient Hospital Stay: Admit: 2020-07-06 | Discharge: 2020-07-06 | Payer: MEDICARE | Primary: Family Medicine

## 2020-07-06 DIAGNOSIS — I2699 Other pulmonary embolism without acute cor pulmonale: Secondary | ICD-10-CM

## 2020-07-06 LAB — POCT INR     (GH): BKR INTERNATIONAL NORMALIZATION RATIO, POC: 1.4

## 2020-07-06 MED ORDER — TADALAFIL 5 MG TABLET
5 mg | ORAL_TABLET | 12 refills | Status: AC
Start: 2020-07-06 — End: ?

## 2020-07-06 NOTE — Telephone Encounter
Approved 07-06-20. Awaiting faxed letter. 07-06-20-11-27-20.

## 2020-07-06 NOTE — Telephone Encounter
LM for patient to contact the office to confirm dosage of coumadin taken last evening and prior to that as well, as INR today is subtherapeutic and we need to plan for the next couple of days as well.  Please call the office.

## 2020-07-06 NOTE — Telephone Encounter
Pt returned your phone call. Please give him a call back.

## 2020-07-06 NOTE — Telephone Encounter
Patient informed that he should double the dose of warfarin he is taking this evening and return on Wednesday again for INR recheck.  Call the office to confirm receiving message.----- Message from Isaias Cowman, MD sent at 07/06/2020  4:19 PM EDT -----His INR was only 1.4Please give him a call.Please him take an extra dose of warfarin this evening and return for INR again on Wednesday

## 2020-07-07 ENCOUNTER — Telehealth: Admit: 2020-07-07 | Payer: PRIVATE HEALTH INSURANCE | Primary: Family Medicine

## 2020-07-07 LAB — BLOOD CULTURE
BKR BLOOD CULTURE: NO GROWTH
BKR BLOOD CULTURE: NO GROWTH

## 2020-07-07 NOTE — Telephone Encounter
-----   Message from Isaias Cowman, MD sent at 07/07/2020 12:39 PM EDT -----Regarding: RE: CoumadinThanks----- Message -----From: Edd Arbour, RNSent: 07/07/2020  10:36 AM EDTTo: Isaias Cowman, MDSubject: Coumadin                                     Quran showed up today: he got my message to double his dose last night, he is currently on 7 mg; he did not double it and only took 7mg  because he was afraid he would hemorrhage, so I told him to double tonights and return tomorrow for INR recheck.

## 2020-07-08 ENCOUNTER — Telehealth: Admit: 2020-07-08 | Payer: PRIVATE HEALTH INSURANCE | Primary: Family Medicine

## 2020-07-08 ENCOUNTER — Inpatient Hospital Stay: Admit: 2020-07-08 | Discharge: 2020-07-08 | Payer: MEDICARE | Primary: Family Medicine

## 2020-07-08 ENCOUNTER — Encounter: Admit: 2020-07-08 | Payer: PRIVATE HEALTH INSURANCE | Primary: Family Medicine

## 2020-07-08 DIAGNOSIS — Z7901 Long term (current) use of anticoagulants: Secondary | ICD-10-CM

## 2020-07-08 DIAGNOSIS — I2699 Other pulmonary embolism without acute cor pulmonale: Secondary | ICD-10-CM

## 2020-07-08 LAB — POCT INR     (GH): BKR INTERNATIONAL NORMALIZATION RATIO, POC: 2.3

## 2020-07-08 NOTE — Telephone Encounter
LM on mobile VM; to continue taking 7mg  coumadin tonight and tomorrow; INR is therapeutic at this point at 2.3, please return on Friday morning to recheck INR prior to the weekend.  Call the office to confirm receipt of my message and instructions.----- Message from Isaias Cowman, MD sent at 07/08/2020  4:41 PM EDT -----Regarding: RE: INR Result and DosingYesThaks----- Message -----From: Edd Arbour, RNSent: 07/08/2020   4:35 PM EDTTo: Isaias Cowman, MDSubject: INR Result and Dosing                        Yesterday he doubled his 7mg  dose to 14mg  and came in today for INR check;  result today is 2.3.Are we going back to 7 mg now? And recheck on Friday or monday

## 2020-07-10 ENCOUNTER — Encounter: Admit: 2020-07-10 | Payer: PRIVATE HEALTH INSURANCE | Primary: Family Medicine

## 2020-07-10 ENCOUNTER — Inpatient Hospital Stay: Admit: 2020-07-10 | Discharge: 2020-07-10 | Payer: MEDICARE | Primary: Family Medicine

## 2020-07-10 DIAGNOSIS — I2699 Other pulmonary embolism without acute cor pulmonale: Secondary | ICD-10-CM

## 2020-07-10 DIAGNOSIS — Z7901 Long term (current) use of anticoagulants: Secondary | ICD-10-CM

## 2020-07-10 LAB — POCT INR     (GH): BKR INTERNATIONAL NORMALIZATION RATIO, POC: 3.9

## 2020-07-10 MED ORDER — WARFARIN 1 MG TABLET
1 mg | ORAL_TABLET | Freq: Every day | ORAL | 2 refills | Status: AC
Start: 2020-07-10 — End: 2020-08-04

## 2020-07-10 NOTE — Telephone Encounter
Pt returned your phone call. Please give him a call back.

## 2020-07-10 NOTE — Telephone Encounter
LM for patient on mobile VM:  Hold only ONE dose of coumadin, tonights dose.Resume coumadin tomorrow, BUT REDUCE dose to 6 mg, take on Saturday and Sunday and return to the office on Monday morning for INR recheck.  Prescription for coumadin 1mg  tablets will b e prescribed to pharmacy, patient informed.----- Message from Isaias Cowman, MD sent at 07/10/2020 11:00 AM EDT -----Regarding: RE: High INRHold one dose and reduce to 6 mg and then rechek on Monday----- Message -----From: Edd Arbour, RNSent: 07/10/2020  10:18 AM EDTTo: Isaias Cowman, MDSubject: High INR                                     Did 7 mg on the 11th and the 12th and INR today is     3.9Hold for two days?Do you want to reduce the dose to 5mg  or 6. I dont think he can handle an alternating schedule.

## 2020-07-13 ENCOUNTER — Inpatient Hospital Stay: Admit: 2020-07-13 | Discharge: 2020-07-13 | Payer: MEDICARE | Primary: Family Medicine

## 2020-07-13 DIAGNOSIS — Z7901 Long term (current) use of anticoagulants: Secondary | ICD-10-CM

## 2020-07-13 LAB — POCT INR     (GH): BKR INTERNATIONAL NORMALIZATION RATIO, POC: 3.4

## 2020-07-13 MED ORDER — STIOLTO RESPIMAT 2.5 MCG-2.5 MCG/ACTUATION SOLUTION FOR INHALATION
2.5-2.5 | Freq: Every day | RESPIRATORY_TRACT | 12 refills | 90.00000 days | Status: AC
Start: 2020-07-13 — End: 2021-09-10

## 2020-07-14 ENCOUNTER — Telehealth: Admit: 2020-07-14 | Payer: PRIVATE HEALTH INSURANCE | Primary: Family Medicine

## 2020-07-14 NOTE — Telephone Encounter
Patient informed to restart coumadin 6mg  (one 5mg  tab and one 1mg  tab) until Monday and return Monday morning for repeat INR and follow visit with nurse practitioner.  He understands the instructions and he will call if he needs a refill on coumadin tablets.----- Message from Isaias Cowman, MD sent at 07/14/2020 12:54 PM EDT -----Regarding: RE: INR/CoumadinTaking one 1mg  is akin to holding itHe can start 6 mg startng tonight and then see steph on Monday----- Message -----From: Edd Arbour, RNSent: 07/14/2020  12:24 PM EDTTo: Isaias Cowman, MDSubject: INR/Coumadin                                 Sorry I forgot to tell you yesterday INR was 3.4.He was instructed on the 13th to reduce the coumadin dose to 6mg .  We sent in the RX to [facility] for 1mg  tabs to take with the 5 mg tabs.I talked to him today;  on Saturday he took 7mg  (sat the 14th)                                        Sunday he took 5mg                                         Yesterday he took 1mg All of this based on the fact that [facility] did not call him to tell him that the 1mg  were ready.  When I told him on the 13th he was to take 6mg  and that the 1mg  rx was being sent to pharmacy.Since he only took 1mg  yesterday; should we have him hold at all?  resume at 6 and recheck with stephanie on Monday.

## 2020-07-19 ENCOUNTER — Encounter: Admit: 2020-07-19 | Payer: PRIVATE HEALTH INSURANCE | Attending: Internal Medicine | Primary: Family Medicine

## 2020-07-20 ENCOUNTER — Ambulatory Visit: Admit: 2020-07-20 | Payer: PRIVATE HEALTH INSURANCE | Attending: Adult Health | Primary: Family Medicine

## 2020-07-20 ENCOUNTER — Inpatient Hospital Stay: Admit: 2020-07-20 | Discharge: 2020-07-20 | Payer: MEDICARE | Primary: Family Medicine

## 2020-07-20 DIAGNOSIS — Z7901 Long term (current) use of anticoagulants: Secondary | ICD-10-CM

## 2020-07-20 LAB — POCT INR     (GH): BKR INTERNATIONAL NORMALIZATION RATIO, POC: 2.7
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
## 2020-07-20 NOTE — Telephone Encounter
Patient requests for prescription for Crestor 40 mg QD which was recently refilled for 12 months supply by Dr. Orvan Falconer 2 weeks prior. Prescription will not be filled at this time.

## 2020-07-23 ENCOUNTER — Ambulatory Visit: Admit: 2020-07-23 | Payer: MEDICARE | Attending: Plastic Surgery within the Head & Neck | Primary: Family Medicine

## 2020-07-23 ENCOUNTER — Encounter
Admit: 2020-07-23 | Payer: PRIVATE HEALTH INSURANCE | Attending: Plastic Surgery within the Head & Neck | Primary: Family Medicine

## 2020-07-23 DIAGNOSIS — R319 Hematuria, unspecified: Secondary | ICD-10-CM

## 2020-07-23 DIAGNOSIS — S060XAA Concussion: Secondary | ICD-10-CM

## 2020-07-23 DIAGNOSIS — R092 Respiratory arrest: Secondary | ICD-10-CM

## 2020-07-23 DIAGNOSIS — R3989 Other symptoms and signs involving the genitourinary system: Secondary | ICD-10-CM

## 2020-07-23 DIAGNOSIS — G43909 Migraine, unspecified, not intractable, without status migrainosus: Secondary | ICD-10-CM

## 2020-07-23 DIAGNOSIS — N400 Enlarged prostate without lower urinary tract symptoms: Secondary | ICD-10-CM

## 2020-07-23 DIAGNOSIS — N319 Neuromuscular dysfunction of bladder, unspecified: Secondary | ICD-10-CM

## 2020-07-23 DIAGNOSIS — M720 Palmar fascial fibromatosis [Dupuytren]: Secondary | ICD-10-CM

## 2020-07-23 DIAGNOSIS — Z8711 Personal history of peptic ulcer disease: Secondary | ICD-10-CM

## 2020-07-23 DIAGNOSIS — E7800 Pure hypercholesterolemia, unspecified: Secondary | ICD-10-CM

## 2020-07-23 DIAGNOSIS — I10 Essential (primary) hypertension: Secondary | ICD-10-CM

## 2020-07-23 DIAGNOSIS — R1310 Dysphagia, unspecified: Secondary | ICD-10-CM

## 2020-07-23 DIAGNOSIS — K760 Fatty (change of) liver, not elsewhere classified: Secondary | ICD-10-CM

## 2020-07-23 DIAGNOSIS — Z5189 Encounter for other specified aftercare: Secondary | ICD-10-CM

## 2020-07-23 DIAGNOSIS — N23 Unspecified renal colic: Secondary | ICD-10-CM

## 2020-07-23 DIAGNOSIS — A419 Sepsis, unspecified organism: Secondary | ICD-10-CM

## 2020-07-23 DIAGNOSIS — N390 Urinary tract infection, site not specified: Secondary | ICD-10-CM

## 2020-07-23 DIAGNOSIS — J370 Chronic laryngitis: Secondary | ICD-10-CM

## 2020-07-23 DIAGNOSIS — J449 Chronic obstructive pulmonary disease, unspecified: Secondary | ICD-10-CM

## 2020-07-23 DIAGNOSIS — M2578 Osteophyte, vertebrae: Secondary | ICD-10-CM

## 2020-07-23 DIAGNOSIS — K219 Gastro-esophageal reflux disease without esophagitis: Secondary | ICD-10-CM

## 2020-07-23 DIAGNOSIS — M1990 Unspecified osteoarthritis, unspecified site: Secondary | ICD-10-CM

## 2020-07-23 DIAGNOSIS — C4491 Basal cell carcinoma of skin, unspecified: Secondary | ICD-10-CM

## 2020-07-24 NOTE — Progress Notes
Review of Systems Constitutional: Negative.  HENT: Negative.  Respiratory: Positive for shortness of breath.  Cardiovascular: Negative.  Gastrointestinal: Positive for abdominal pain and heartburn. Genitourinary: Negative.  Musculoskeletal: Positive for back pain and neck pain. Skin: Negative.  Neurological: Positive for tingling and headaches. Endo/Heme/Allergies: Bruises/bleeds easily. Psychiatric/Behavioral: Positive for memory loss.
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
## 2020-07-24 NOTE — Progress Notes
errore

## 2020-07-28 ENCOUNTER — Inpatient Hospital Stay: Admit: 2020-07-28 | Discharge: 2020-07-28 | Payer: MEDICARE | Primary: Family Medicine

## 2020-07-28 ENCOUNTER — Encounter: Admit: 2020-07-28 | Payer: PRIVATE HEALTH INSURANCE | Primary: Family Medicine

## 2020-07-28 DIAGNOSIS — Z7901 Long term (current) use of anticoagulants: Secondary | ICD-10-CM

## 2020-07-28 LAB — POCT INR     (GH): BKR INTERNATIONAL NORMALIZATION RATIO, POC: 3.3

## 2020-07-28 NOTE — Progress Notes
Patient contacted and informed to hold one dose (tonights dose of coumadin)  And resume tomorrow at the 6 mg dose and return on Monday for INR recheck.  Patient understands instructions.

## 2020-07-29 ENCOUNTER — Encounter: Admit: 2020-07-29 | Payer: PRIVATE HEALTH INSURANCE | Primary: Family Medicine

## 2020-07-29 NOTE — Progress Notes
From: Edd Arbour, RN Sent: 07/28/2020 ?12:07 PM EDT To: Isaias Cowman, MD Subject: INR/FYI ? ? ? ? ? ? ? ? ? ? ? ? ? ? ? ? ? ? ? INR on 8/23 ? ?2.7 continued with 6mg  REcheck on 8/31 3.3 instructed hold one dose resume at 6mg  and recheck on Tuesday (Monday is holiday)

## 2020-08-04 ENCOUNTER — Inpatient Hospital Stay: Admit: 2020-08-04 | Discharge: 2020-08-04 | Payer: MEDICARE | Primary: Family Medicine

## 2020-08-04 ENCOUNTER — Encounter: Admit: 2020-08-04 | Payer: PRIVATE HEALTH INSURANCE | Attending: Medical Oncology | Primary: Family Medicine

## 2020-08-04 DIAGNOSIS — Z7901 Long term (current) use of anticoagulants: Secondary | ICD-10-CM

## 2020-08-04 DIAGNOSIS — I2699 Other pulmonary embolism without acute cor pulmonale: Secondary | ICD-10-CM

## 2020-08-04 LAB — POCT INR     (GH): BKR INTERNATIONAL NORMALIZATION RATIO, POC: 2.7

## 2020-08-04 MED ORDER — WARFARIN 1 MG TABLET
1 mg | ORAL_TABLET | Freq: Every day | ORAL | 2 refills | Status: AC
Start: 2020-08-04 — End: 2020-08-30

## 2020-08-10 ENCOUNTER — Encounter: Admit: 2020-08-10 | Payer: PRIVATE HEALTH INSURANCE | Primary: Family Medicine

## 2020-08-10 ENCOUNTER — Telehealth: Admit: 2020-08-10 | Payer: PRIVATE HEALTH INSURANCE | Primary: Family Medicine

## 2020-08-10 ENCOUNTER — Inpatient Hospital Stay: Admit: 2020-08-10 | Discharge: 2020-08-10 | Payer: MEDICARE | Primary: Family Medicine

## 2020-08-10 DIAGNOSIS — Z7901 Long term (current) use of anticoagulants: Secondary | ICD-10-CM

## 2020-08-10 LAB — POCT INR     (GH): BKR INTERNATIONAL NORMALIZATION RATIO, POC: 4.7

## 2020-08-10 NOTE — Telephone Encounter
Informed patient of INR 4.7, Asked patient to hold coumadin today and tomorrow (mon & Tue), patient repeated back to hold x 2 days, return on Wednesday, per Dr. Nedra Hai, to have repeat INR and visit with Mikeal Hawthorne, NP

## 2020-08-12 ENCOUNTER — Inpatient Hospital Stay: Admit: 2020-08-12 | Discharge: 2020-08-12 | Payer: MEDICARE | Primary: Family Medicine

## 2020-08-12 ENCOUNTER — Encounter: Admit: 2020-08-12 | Payer: PRIVATE HEALTH INSURANCE | Attending: Adult Health | Primary: Family Medicine

## 2020-08-12 ENCOUNTER — Ambulatory Visit: Admit: 2020-08-12 | Payer: MEDICARE | Attending: Adult Health | Primary: Family Medicine

## 2020-08-12 DIAGNOSIS — M720 Palmar fascial fibromatosis [Dupuytren]: Secondary | ICD-10-CM

## 2020-08-12 DIAGNOSIS — R2242 Localized swelling, mass and lump, left lower limb: Secondary | ICD-10-CM

## 2020-08-12 DIAGNOSIS — K760 Fatty (change of) liver, not elsewhere classified: Secondary | ICD-10-CM

## 2020-08-12 DIAGNOSIS — Z7901 Long term (current) use of anticoagulants: Secondary | ICD-10-CM

## 2020-08-12 DIAGNOSIS — Z5189 Encounter for other specified aftercare: Secondary | ICD-10-CM

## 2020-08-12 DIAGNOSIS — I10 Essential (primary) hypertension: Secondary | ICD-10-CM

## 2020-08-12 DIAGNOSIS — J449 Chronic obstructive pulmonary disease, unspecified: Secondary | ICD-10-CM

## 2020-08-12 DIAGNOSIS — N23 Unspecified renal colic: Secondary | ICD-10-CM

## 2020-08-12 DIAGNOSIS — M1990 Unspecified osteoarthritis, unspecified site: Secondary | ICD-10-CM

## 2020-08-12 DIAGNOSIS — E7800 Pure hypercholesterolemia, unspecified: Secondary | ICD-10-CM

## 2020-08-12 DIAGNOSIS — R3989 Other symptoms and signs involving the genitourinary system: Secondary | ICD-10-CM

## 2020-08-12 DIAGNOSIS — R092 Respiratory arrest: Secondary | ICD-10-CM

## 2020-08-12 DIAGNOSIS — Z8711 Personal history of peptic ulcer disease: Secondary | ICD-10-CM

## 2020-08-12 DIAGNOSIS — N319 Neuromuscular dysfunction of bladder, unspecified: Secondary | ICD-10-CM

## 2020-08-12 DIAGNOSIS — A419 Sepsis, unspecified organism: Secondary | ICD-10-CM

## 2020-08-12 DIAGNOSIS — S060XAA Concussion: Secondary | ICD-10-CM

## 2020-08-12 DIAGNOSIS — K219 Gastro-esophageal reflux disease without esophagitis: Secondary | ICD-10-CM

## 2020-08-12 DIAGNOSIS — N400 Enlarged prostate without lower urinary tract symptoms: Secondary | ICD-10-CM

## 2020-08-12 DIAGNOSIS — R319 Hematuria, unspecified: Secondary | ICD-10-CM

## 2020-08-12 DIAGNOSIS — C4491 Basal cell carcinoma of skin, unspecified: Secondary | ICD-10-CM

## 2020-08-12 DIAGNOSIS — G43909 Migraine, unspecified, not intractable, without status migrainosus: Secondary | ICD-10-CM

## 2020-08-12 DIAGNOSIS — N390 Urinary tract infection, site not specified: Secondary | ICD-10-CM

## 2020-08-12 LAB — POCT INR     (GH): BKR INTERNATIONAL NORMALIZATION RATIO, POC: 2.7

## 2020-08-12 NOTE — Other
Tried calling patient multiple times to relay result- only got busy signal on his phone.Finally reached his emergency contact, brother Edythe Lynn, who will relay message to him. Advised pt to reach out to his PMD for left ankle/foot edema as he has a healing laceration to his lateral malleolus. Edythe Lynn verbalizes understanding, and will tell patient he can call me back if he has any other questions
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
## 2020-08-12 NOTE — Patient Instructions
Office Visit 08/12/20 You have been taking 6 mg daily (weekly dose of 42 mg), but your INR has been supratherapeutic (too high)We are going to decrease your dose to 5 mg x3 days (Mon/Wed/Fri) and 6 mg x4 days (Tues/Thurs/Sat/Sun) (weekly dose of 39 mg)  September 2021  Sunday Monday Tuesday Wednesday Thursday Friday Saturday  12 13 14 15     5  mg  16     6 mg 175 mg  18     6 mg 19    6 mg 205 mg  216 mg 22Return for INR check 23 24 25 26 27 28 29  30

## 2020-08-18 ENCOUNTER — Encounter: Admit: 2020-08-18 | Payer: PRIVATE HEALTH INSURANCE | Attending: Internal Medicine | Primary: Family Medicine

## 2020-08-18 DIAGNOSIS — K219 Gastro-esophageal reflux disease without esophagitis: Secondary | ICD-10-CM

## 2020-08-19 ENCOUNTER — Ambulatory Visit: Admit: 2020-08-19 | Payer: MEDICARE | Attending: Adult Health | Primary: Family Medicine

## 2020-08-19 ENCOUNTER — Encounter: Admit: 2020-08-19 | Payer: PRIVATE HEALTH INSURANCE | Attending: Adult Health | Primary: Family Medicine

## 2020-08-19 ENCOUNTER — Inpatient Hospital Stay: Admit: 2020-08-19 | Discharge: 2020-08-19 | Payer: MEDICARE | Primary: Family Medicine

## 2020-08-19 DIAGNOSIS — I10 Essential (primary) hypertension: Secondary | ICD-10-CM

## 2020-08-19 DIAGNOSIS — Z7901 Long term (current) use of anticoagulants: Secondary | ICD-10-CM

## 2020-08-19 DIAGNOSIS — N400 Enlarged prostate without lower urinary tract symptoms: Secondary | ICD-10-CM

## 2020-08-19 DIAGNOSIS — Z5189 Encounter for other specified aftercare: Secondary | ICD-10-CM

## 2020-08-19 DIAGNOSIS — Z8711 Personal history of peptic ulcer disease: Secondary | ICD-10-CM

## 2020-08-19 DIAGNOSIS — A419 Sepsis, unspecified organism: Secondary | ICD-10-CM

## 2020-08-19 DIAGNOSIS — K219 Gastro-esophageal reflux disease without esophagitis: Secondary | ICD-10-CM

## 2020-08-19 DIAGNOSIS — R319 Hematuria, unspecified: Secondary | ICD-10-CM

## 2020-08-19 DIAGNOSIS — J449 Chronic obstructive pulmonary disease, unspecified: Secondary | ICD-10-CM

## 2020-08-19 DIAGNOSIS — N23 Unspecified renal colic: Secondary | ICD-10-CM

## 2020-08-19 DIAGNOSIS — C4491 Basal cell carcinoma of skin, unspecified: Secondary | ICD-10-CM

## 2020-08-19 DIAGNOSIS — R3989 Other symptoms and signs involving the genitourinary system: Secondary | ICD-10-CM

## 2020-08-19 DIAGNOSIS — G43909 Migraine, unspecified, not intractable, without status migrainosus: Secondary | ICD-10-CM

## 2020-08-19 DIAGNOSIS — N319 Neuromuscular dysfunction of bladder, unspecified: Secondary | ICD-10-CM

## 2020-08-19 DIAGNOSIS — N390 Urinary tract infection, site not specified: Secondary | ICD-10-CM

## 2020-08-19 DIAGNOSIS — S060XAA Concussion: Secondary | ICD-10-CM

## 2020-08-19 DIAGNOSIS — K760 Fatty (change of) liver, not elsewhere classified: Secondary | ICD-10-CM

## 2020-08-19 DIAGNOSIS — E7800 Pure hypercholesterolemia, unspecified: Secondary | ICD-10-CM

## 2020-08-19 DIAGNOSIS — M1990 Unspecified osteoarthritis, unspecified site: Secondary | ICD-10-CM

## 2020-08-19 DIAGNOSIS — M720 Palmar fascial fibromatosis [Dupuytren]: Secondary | ICD-10-CM

## 2020-08-19 DIAGNOSIS — R092 Respiratory arrest: Secondary | ICD-10-CM

## 2020-08-19 LAB — POCT INR     (GH): BKR INTERNATIONAL NORMALIZATION RATIO, POC: 2.4

## 2020-08-19 MED ORDER — DOXYCYCLINE MONOHYDRATE 100 MG CAPSULE
100 mg | Status: AC
Start: 2020-08-19 — End: 2020-11-18

## 2020-08-19 MED ORDER — FAMOTIDINE 40 MG TABLET
40 mg | Status: AC
Start: 2020-08-19 — End: 2021-09-10

## 2020-08-19 MED ORDER — PANTOPRAZOLE 40 MG TABLET,DELAYED RELEASE
40 mg | ORAL_TABLET | Freq: Every day | ORAL | 2 refills | Status: AC
Start: 2020-08-19 — End: 2021-07-30

## 2020-08-19 NOTE — Patient Instructions
Follow up appointment 08/19/20 You having been taking warfarin (Coumadin) 6 mg x4 days (Tues/Thurs/Sat/Sun) and 5 mg x 3 days (Mon/Wed/Fri), for a weekly dose of 39 mg.Today your INR was: 2.4, which is therapeutic. We want the value to be between 2.0-3.0.Please continue your current regimen: 6 mg x4 days (Tues/Thurs/Sat/Sun) and 5 mg x 3 days (Mon/Wed/Fri), for a weekly dose of 39 mg.September 2021  Sunday Monday Tuesday Wednesday Thursday Friday Saturday  19 20 21  225 mg 23     6 mg 24     5 mg 25     6 mg 26     6 mg 275 mg 28    6 mg 29        5 mg 30     6 mg 1      5 mg 2     6 mg October 2021  Sunday Monday Tuesday Wednesday Thursday Friday Saturday  3   6 mg 45 mg 5     6 mg 6Return for INR & fu visit with Judeth Cornfield NP 7   8 9
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
## 2020-08-29 ENCOUNTER — Encounter: Admit: 2020-08-29 | Payer: PRIVATE HEALTH INSURANCE | Attending: Medical Oncology | Primary: Family Medicine

## 2020-08-29 DIAGNOSIS — Z7901 Long term (current) use of anticoagulants: Secondary | ICD-10-CM

## 2020-08-29 DIAGNOSIS — I2699 Other pulmonary embolism without acute cor pulmonale: Secondary | ICD-10-CM

## 2020-08-30 MED ORDER — WARFARIN 1 MG TABLET
1 mg | ORAL_TABLET | Freq: Every day | ORAL | 2 refills | Status: AC
Start: 2020-08-30 — End: 2020-09-22

## 2020-09-02 ENCOUNTER — Encounter: Admit: 2020-09-02 | Payer: PRIVATE HEALTH INSURANCE | Attending: Adult Health | Primary: Family Medicine

## 2020-09-02 ENCOUNTER — Ambulatory Visit: Admit: 2020-09-02 | Payer: MEDICARE | Attending: Adult Health | Primary: Family Medicine

## 2020-09-02 ENCOUNTER — Inpatient Hospital Stay: Admit: 2020-09-02 | Discharge: 2020-09-02 | Payer: MEDICARE | Primary: Family Medicine

## 2020-09-02 DIAGNOSIS — S060XAA Concussion: Secondary | ICD-10-CM

## 2020-09-02 DIAGNOSIS — Z8711 Personal history of peptic ulcer disease: Secondary | ICD-10-CM

## 2020-09-02 DIAGNOSIS — E7800 Pure hypercholesterolemia, unspecified: Secondary | ICD-10-CM

## 2020-09-02 DIAGNOSIS — K219 Gastro-esophageal reflux disease without esophagitis: Secondary | ICD-10-CM

## 2020-09-02 DIAGNOSIS — M720 Palmar fascial fibromatosis [Dupuytren]: Secondary | ICD-10-CM

## 2020-09-02 DIAGNOSIS — I10 Essential (primary) hypertension: Secondary | ICD-10-CM

## 2020-09-02 DIAGNOSIS — G43909 Migraine, unspecified, not intractable, without status migrainosus: Secondary | ICD-10-CM

## 2020-09-02 DIAGNOSIS — R092 Respiratory arrest: Secondary | ICD-10-CM

## 2020-09-02 DIAGNOSIS — K760 Fatty (change of) liver, not elsewhere classified: Secondary | ICD-10-CM

## 2020-09-02 DIAGNOSIS — N390 Urinary tract infection, site not specified: Secondary | ICD-10-CM

## 2020-09-02 DIAGNOSIS — Z7901 Long term (current) use of anticoagulants: Secondary | ICD-10-CM

## 2020-09-02 DIAGNOSIS — R3989 Other symptoms and signs involving the genitourinary system: Secondary | ICD-10-CM

## 2020-09-02 DIAGNOSIS — C4491 Basal cell carcinoma of skin, unspecified: Secondary | ICD-10-CM

## 2020-09-02 DIAGNOSIS — N319 Neuromuscular dysfunction of bladder, unspecified: Secondary | ICD-10-CM

## 2020-09-02 DIAGNOSIS — R319 Hematuria, unspecified: Secondary | ICD-10-CM

## 2020-09-02 DIAGNOSIS — N400 Enlarged prostate without lower urinary tract symptoms: Secondary | ICD-10-CM

## 2020-09-02 DIAGNOSIS — M1990 Unspecified osteoarthritis, unspecified site: Secondary | ICD-10-CM

## 2020-09-02 DIAGNOSIS — A419 Sepsis, unspecified organism: Secondary | ICD-10-CM

## 2020-09-02 DIAGNOSIS — Z5189 Encounter for other specified aftercare: Secondary | ICD-10-CM

## 2020-09-02 DIAGNOSIS — N23 Unspecified renal colic: Secondary | ICD-10-CM

## 2020-09-02 DIAGNOSIS — J449 Chronic obstructive pulmonary disease, unspecified: Secondary | ICD-10-CM

## 2020-09-02 LAB — POCT INR     (GH): BKR INTERNATIONAL NORMALIZATION RATIO, POC: 2.8
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
## 2020-09-04 ENCOUNTER — Telehealth: Admit: 2020-09-04 | Payer: PRIVATE HEALTH INSURANCE | Attending: Adult Health | Primary: Family Medicine

## 2020-09-04 NOTE — Telephone Encounter
Tried calling patient yesterday and today, could not reach him. Left vm on phone to let him know that Dr Nedra Hai does NOT think it is necessary for him to be bridged with enoxaparin (Lovenox) after holding his warfarin (Coumadin) x2 days in preparation for an ESI. I told [name] he should resume his Coumadin at his normal dose 5 mg on M/W/F and 6 mg on Tue/Th/Sat/Sun (39 mg weekly) day of his procedure.He should call for POCT INR and fu appt with me approx 5 days after resuming Coumadin

## 2020-09-07 ENCOUNTER — Telehealth: Admit: 2020-09-07 | Payer: PRIVATE HEALTH INSURANCE | Attending: Adult Health | Primary: Family Medicine

## 2020-09-07 NOTE — Telephone Encounter
S/w pt- was able to confirm he is getting ESI on Wed 10/13- will hold warfarin (Coumadin) starting today and resume normal dose on Wed night. Confirmed w/ Dr Nedra Hai no need for Lovenox bridge. I will see pt for INR check on Monday (5 days after resuming)

## 2020-09-14 ENCOUNTER — Inpatient Hospital Stay: Admit: 2020-09-14 | Discharge: 2020-09-14 | Payer: MEDICARE | Primary: Family Medicine

## 2020-09-14 ENCOUNTER — Telehealth: Admit: 2020-09-14 | Payer: PRIVATE HEALTH INSURANCE | Primary: Family Medicine

## 2020-09-14 ENCOUNTER — Ambulatory Visit: Admit: 2020-09-14 | Payer: MEDICARE | Attending: Adult Health | Primary: Family Medicine

## 2020-09-14 DIAGNOSIS — Z7901 Long term (current) use of anticoagulants: Secondary | ICD-10-CM

## 2020-09-14 LAB — POCT INR     (GH): BKR INTERNATIONAL NORMALIZATION RATIO, POC: 1.5

## 2020-09-14 NOTE — Telephone Encounter
Follow up call to the patient, confirmed INR is 1.5 he confirms hes had his ESI procedure; he was instructed to take 6mg  warfarin daily now and recheck the INR on Friday; he requested lab to be scheduled at 9:15am.----- Message from Isaias Cowman, MD sent at 09/14/2020 12:18 PM EDT -----Regarding: RE: Subtherapeutic INRSo I am assuming that the procedure is done and that he is back on ACPlease have him take 6 mg a day and repeat the INR on Friday - if and when he calls back----- Message -----From: Edd Arbour, RNSent: 09/14/2020  11:54 AM EDTTo: Isaias Cowman, MDSubject: Subtherapeutic INR                           INR result today is 1.5.Held for two days on the 11 & 12th for ESI procedure informed to resume the evening of the procedure.Hes been on 5mg  MWF and 6mg  T Th Sat SunI tried calling him to confirm what hes taken and I have to LM.

## 2020-09-18 ENCOUNTER — Telehealth: Admit: 2020-09-18 | Payer: PRIVATE HEALTH INSURANCE | Primary: Family Medicine

## 2020-09-18 ENCOUNTER — Inpatient Hospital Stay: Admit: 2020-09-18 | Discharge: 2020-09-18 | Payer: MEDICARE | Primary: Family Medicine

## 2020-09-18 DIAGNOSIS — Z7901 Long term (current) use of anticoagulants: Secondary | ICD-10-CM

## 2020-09-18 LAB — POCT INR     (GH): BKR INTERNATIONAL NORMALIZATION RATIO, POC: 1.6

## 2020-09-18 NOTE — Telephone Encounter
Follow up call to the patient; he does confirm that he has been taking 6mg  daily of warfarin, todays INR result is 1.6, last evening he reports he went out and had four beers and some Baileys, while in the office today, he vomited x1 stating that he will not be drinking beer and baileys anymore.  Discussed INR result with NP and we informed the patient that he should continue with warfarin 6mg  and recheck his INR on 10/26.

## 2020-09-22 ENCOUNTER — Encounter: Admit: 2020-09-22 | Payer: PRIVATE HEALTH INSURANCE | Attending: Medical Oncology | Primary: Family Medicine

## 2020-09-22 ENCOUNTER — Inpatient Hospital Stay: Admit: 2020-09-22 | Discharge: 2020-09-22 | Payer: MEDICARE | Primary: Family Medicine

## 2020-09-22 DIAGNOSIS — Z7901 Long term (current) use of anticoagulants: Secondary | ICD-10-CM

## 2020-09-22 DIAGNOSIS — I2699 Other pulmonary embolism without acute cor pulmonale: Secondary | ICD-10-CM

## 2020-09-22 LAB — POCT INR     (GH): BKR INTERNATIONAL NORMALIZATION RATIO, POC: 2.9

## 2020-09-22 MED ORDER — WARFARIN 1 MG TABLET
1 mg | ORAL_TABLET | Freq: Every day | ORAL | 2 refills | Status: AC
Start: 2020-09-22 — End: 2020-10-17

## 2020-09-23 ENCOUNTER — Telehealth: Admit: 2020-09-23 | Payer: PRIVATE HEALTH INSURANCE | Primary: Family Medicine

## 2020-09-23 NOTE — Telephone Encounter
Follow up call to patient; informed the remain on the 6mg  coumadin daily and recheck in one week. Patient informed me that I could schedule him for his next appointment with Dr. Nedra Hai and let him know when it is when he comes in for his next INR appointment.----- Message from Isaias Cowman, MD sent at 09/23/2020  2:19 PM EDT -----Regarding: RE: INR ResultYes.he needs an appt to see me as well, next available will be fine.----- Message -----From: Edd Arbour, RNSent: 09/23/2020   2:07 PM EDTTo: Isaias Cowman, MDSubject: INR Result                                   Yesterdays result was 2.9.I will inform him to continue at the current dose of 6 mg daily and recheck in one week.

## 2020-09-29 ENCOUNTER — Inpatient Hospital Stay: Admit: 2020-09-29 | Discharge: 2020-09-29 | Payer: MEDICARE | Primary: Family Medicine

## 2020-09-29 DIAGNOSIS — Z7901 Long term (current) use of anticoagulants: Secondary | ICD-10-CM

## 2020-09-29 LAB — POCT INR     (GH): BKR INTERNATIONAL NORMALIZATION RATIO, POC: 2.8

## 2020-10-17 ENCOUNTER — Encounter: Admit: 2020-10-17 | Payer: PRIVATE HEALTH INSURANCE | Attending: Medical Oncology | Primary: Family Medicine

## 2020-10-17 DIAGNOSIS — Z7901 Long term (current) use of anticoagulants: Secondary | ICD-10-CM

## 2020-10-17 DIAGNOSIS — I2699 Other pulmonary embolism without acute cor pulmonale: Secondary | ICD-10-CM

## 2020-10-17 MED ORDER — WARFARIN 1 MG TABLET
1 mg | ORAL_TABLET | Freq: Every day | ORAL | 2 refills | Status: AC
Start: 2020-10-17 — End: 2020-11-11

## 2020-10-23 ENCOUNTER — Telehealth: Admit: 2020-10-23 | Payer: PRIVATE HEALTH INSURANCE | Primary: Family Medicine

## 2020-10-23 ENCOUNTER — Inpatient Hospital Stay: Admit: 2020-10-23 | Discharge: 2020-10-23 | Payer: MEDICARE | Primary: Family Medicine

## 2020-10-23 ENCOUNTER — Ambulatory Visit: Admit: 2020-10-23 | Payer: PRIVATE HEALTH INSURANCE | Attending: Medical Oncology | Primary: Family Medicine

## 2020-10-23 ENCOUNTER — Encounter: Admit: 2020-10-23 | Payer: PRIVATE HEALTH INSURANCE | Attending: Pulmonary Disease | Primary: Family Medicine

## 2020-10-23 DIAGNOSIS — D649 Anemia, unspecified: Secondary | ICD-10-CM

## 2020-10-23 DIAGNOSIS — I2699 Other pulmonary embolism without acute cor pulmonale: Secondary | ICD-10-CM

## 2020-10-23 DIAGNOSIS — Z7901 Long term (current) use of anticoagulants: Secondary | ICD-10-CM

## 2020-10-23 DIAGNOSIS — Z01812 Encounter for preprocedural laboratory examination: Secondary | ICD-10-CM

## 2020-10-23 LAB — COMPREHENSIVE METABOLIC PANEL
BKR A/G RATIO: 0.9
BKR ALANINE AMINOTRANSFERASE (ALT): 52 U/L (ref 12–78)
BKR ALBUMIN: 3.3 g/dL — ABNORMAL LOW (ref 3.4–5.0)
BKR ALKALINE PHOSPHATASE: 177 U/L — ABNORMAL HIGH (ref 20–135)
BKR ANION GAP: 10 g/dL (ref 5–18)
BKR ASPARTATE AMINOTRANSFERASE (AST): 41 U/L — ABNORMAL HIGH (ref 5–37)
BKR AST/ALT RATIO: 0.8
BKR BILIRUBIN TOTAL: 0.2 mg/dL (ref 0.0–1.0)
BKR BLOOD UREA NITROGEN: 22 mg/dL (ref 8–25)
BKR BUN / CREAT RATIO: 23.9 (ref 8.0–25.0)
BKR CALCIUM: 8.6 mg/dL (ref 8.4–10.3)
BKR CHLORIDE: 110 mmol/L (ref 95–115)
BKR CO2: 25 mmol/L (ref 21–32)
BKR CREATININE: 0.92 mg/dL (ref 0.50–1.30)
BKR EGFR (AFR AMER): >60 mL/min/{1.73_m2} (ref >60)
BKR EGFR (NON AFRICAN AMERICAN): >60 mL/min/{1.73_m2} (ref >60)
BKR GLOBULIN: 3.7 g/dL
BKR GLUCOSE: 101 mg/dL — ABNORMAL HIGH (ref 70–100)
BKR MCH: 25 mmol/L (ref 21–32)
BKR OSMOLALITY CALCULATION: 292 mOsm/kg (ref 275–295)
BKR POTASSIUM: 4.2 mmol/L (ref 3.5–5.1)
BKR SODIUM: 145 mmol/L — ABNORMAL LOW (ref 136–145)

## 2020-10-23 LAB — CBC WITH AUTO DIFFERENTIAL
BKR BASOPHILS: 0.4 % (ref 0.0–1.0)
BKR EOSINOPHILS: 2.4 % — ABNORMAL LOW (ref 0.0–5.0)
BKR HEMATOCRIT: 40.7 % (ref 38.50–50.00)
BKR HEMOGLOBIN: 12.7 g/dL — ABNORMAL LOW (ref 13.2–17.1)
BKR LYMPHOCYTES: 22.6 % (ref 17.0–50.0)
BKR MCHC: 31.2 g/dL (ref 31.0–36.0)
BKR MCV: 88.1 fL (ref 80.0–100.0)
BKR MONOCYTES: 6.8 % (ref 4.0–12.0)
BKR MPV: 9.9 fL (ref 8.0–12.0)
BKR NEUTROPHILS: 67.8 % (ref 39.0–72.0)
BKR PLATELETS: 237 x 1000/ÂµL (ref 150–420)
BKR PROTEIN TOTAL: 6.8 % (ref 4.0–12.0)
BKR RDW-CV: 14.9 % (ref 11.0–15.0)
BKR WAM ANALYZER ANC: 5.05 x 1000/ÂµL (ref 2.00–7.60)
BKR WAM RED BLOOD CELL COUNT.: 4.62 M/ÂµL (ref 4.00–6.00)
BKR WHITE BLOOD CELL COUNT: 7.5 x1000/ÂµL (ref 4.0–11.0)

## 2020-10-23 LAB — IRON AND TIBC
BKR IRON SATURATION (SCCCT BH GH LM): 9 % — ABNORMAL LOW (ref 11–46)
BKR IRON: 34 ug/dL — ABNORMAL LOW (ref 50–175)
BKR TOTAL IRON BINDING CAPACITY (SCCCT  BH GH LM): 380 ug/dL (ref 250–450)

## 2020-10-23 LAB — LACTATE DEHYDROGENASE: BKR LACTATE DEHYDROGENASE: 216 U/L — ABNORMAL HIGH (ref 35–190)

## 2020-10-23 LAB — VITAMIN B12: BKR VITAMIN B12: 303 pg/mL (ref 211–911)

## 2020-10-23 LAB — RETICULOCYTES
BKR WAM IRF: 21.9 % — ABNORMAL HIGH (ref 3.0–15.9)
BKR WAM RETICULOCYTE - ABS (3 DEC): 0.074 10??6 cells/uL (ref 0.023–0.140)
BKR WAM RETICULOCYTE COUNT PCT (1 DEC): 1.6 % (ref 0.6–2.7)
BKR WAM RETICULOCYTE HGB EQUIVALENT: 33 pg (ref 28.2–35.7)

## 2020-10-23 LAB — POCT INR     (GH): BKR INTERNATIONAL NORMALIZATION RATIO, POC: 3.3

## 2020-10-23 LAB — FERRITIN: BKR FERRITIN: 34 ng/mL (ref 26–388)

## 2020-10-23 NOTE — Telephone Encounter
Patient informed that he is iron deficient; instructed to take iron 325mg  three times weekly along with Vit C;  He has an appointment next Friday and will schedule the three month visit and labs at that time.----- Message from Isaias Cowman, MD sent at 10/23/2020 12:50 PM EST -----Please tell him that he is iron deficientPlease instruct him to take (additional) iron Monday Wednesday and Fridays with vitamin-CPlease give min appointment for a follow-up visit and blood test in 3 months
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
## 2020-10-26 LAB — METHYLMALONIC ACID: METHYLMALONIC ACID: 157 nmol/L (ref 87–318)

## 2020-10-30 ENCOUNTER — Inpatient Hospital Stay: Admit: 2020-10-30 | Discharge: 2020-10-30 | Payer: MEDICARE | Primary: Family Medicine

## 2020-10-30 DIAGNOSIS — Z7901 Long term (current) use of anticoagulants: Secondary | ICD-10-CM

## 2020-10-30 LAB — POCT INR     (GH): BKR INTERNATIONAL NORMALIZATION RATIO, POC: 1.8

## 2020-11-11 ENCOUNTER — Encounter: Admit: 2020-11-11 | Payer: PRIVATE HEALTH INSURANCE | Attending: Medical Oncology | Primary: Family Medicine

## 2020-11-11 DIAGNOSIS — Z7901 Long term (current) use of anticoagulants: Secondary | ICD-10-CM

## 2020-11-11 DIAGNOSIS — I2699 Other pulmonary embolism without acute cor pulmonale: Secondary | ICD-10-CM

## 2020-11-11 MED ORDER — WARFARIN 1 MG TABLET
1 mg | ORAL_TABLET | Freq: Every day | ORAL | 2 refills | Status: AC
Start: 2020-11-11 — End: 2020-12-06

## 2020-11-16 ENCOUNTER — Ambulatory Visit: Admit: 2020-11-16 | Payer: PRIVATE HEALTH INSURANCE | Primary: Family Medicine

## 2020-11-16 ENCOUNTER — Inpatient Hospital Stay: Admit: 2020-11-16 | Discharge: 2020-11-16 | Payer: MEDICARE | Primary: Family Medicine

## 2020-11-16 DIAGNOSIS — Z20822 Contact with and (suspected) exposure to covid-19: Secondary | ICD-10-CM

## 2020-11-16 DIAGNOSIS — Z01812 Encounter for preprocedural laboratory examination: Secondary | ICD-10-CM

## 2020-11-16 LAB — COVID-19 CLEARANCE OR FOR PLACEMENT ONLY: BKR SARS-COV-2 RNA (COVID-19) (YH): NEGATIVE

## 2020-11-18 ENCOUNTER — Inpatient Hospital Stay: Admit: 2020-11-18 | Discharge: 2020-11-18 | Payer: MEDICARE | Primary: Family Medicine

## 2020-11-18 DIAGNOSIS — J410 Simple chronic bronchitis: Secondary | ICD-10-CM

## 2020-11-18 DIAGNOSIS — J8489 Other specified interstitial pulmonary diseases: Secondary | ICD-10-CM

## 2020-11-18 MED ORDER — TOPIRAMATE 25 MG TABLET
25 | Freq: Every evening | ORAL | Status: SS
Start: 2020-11-18 — End: 2023-01-10

## 2020-11-18 MED ORDER — ALBUTEROL SULFATE 2.5 MG/3 ML (0.083 %) SOLUTION FOR NEBULIZATION
2.5 mg /3 mL (0.083 %) | Freq: Once | RESPIRATORY_TRACT | Status: CP
Start: 2020-11-18 — End: ?
  Administered 2020-11-18: 15:00:00 2.5 mL via RESPIRATORY_TRACT

## 2020-11-18 MED ORDER — TADALAFIL 5 MG TABLET
5 | ORAL | 4.00 refills | 30.00000 days | Status: AC
Start: 2020-11-18 — End: 2021-01-05

## 2020-11-18 MED ORDER — OMEPRAZOLE 20 MG CAPSULE,DELAYED RELEASE
20 | Freq: Every day | ORAL | 2.00 refills | 90.00000 days | Status: AC
Start: 2020-11-18 — End: ?

## 2020-11-23 ENCOUNTER — Ambulatory Visit: Admit: 2020-11-23 | Payer: PRIVATE HEALTH INSURANCE | Primary: Family Medicine

## 2020-12-01 ENCOUNTER — Emergency Department: Admit: 2020-12-01 | Payer: PRIVATE HEALTH INSURANCE | Primary: Family Medicine

## 2020-12-01 ENCOUNTER — Inpatient Hospital Stay: Admit: 2020-12-01 | Discharge: 2020-12-01 | Payer: MEDICARE

## 2020-12-01 DIAGNOSIS — Z87891 Personal history of nicotine dependence: Secondary | ICD-10-CM

## 2020-12-01 DIAGNOSIS — Z86718 Personal history of other venous thrombosis and embolism: Secondary | ICD-10-CM

## 2020-12-01 DIAGNOSIS — M1990 Unspecified osteoarthritis, unspecified site: Secondary | ICD-10-CM

## 2020-12-01 DIAGNOSIS — Z885 Allergy status to narcotic agent status: Secondary | ICD-10-CM

## 2020-12-01 DIAGNOSIS — M79605 Pain in left leg: Secondary | ICD-10-CM

## 2020-12-01 DIAGNOSIS — Z886 Allergy status to analgesic agent status: Secondary | ICD-10-CM

## 2020-12-01 DIAGNOSIS — N400 Enlarged prostate without lower urinary tract symptoms: Secondary | ICD-10-CM

## 2020-12-01 DIAGNOSIS — Z7901 Long term (current) use of anticoagulants: Secondary | ICD-10-CM

## 2020-12-01 DIAGNOSIS — K219 Gastro-esophageal reflux disease without esophagitis: Secondary | ICD-10-CM

## 2020-12-01 DIAGNOSIS — Z79899 Other long term (current) drug therapy: Secondary | ICD-10-CM

## 2020-12-01 DIAGNOSIS — Z888 Allergy status to other drugs, medicaments and biological substances status: Secondary | ICD-10-CM

## 2020-12-01 DIAGNOSIS — E7800 Pure hypercholesterolemia, unspecified: Secondary | ICD-10-CM

## 2020-12-01 DIAGNOSIS — N319 Neuromuscular dysfunction of bladder, unspecified: Secondary | ICD-10-CM

## 2020-12-01 DIAGNOSIS — M79604 Pain in right leg: Secondary | ICD-10-CM

## 2020-12-01 DIAGNOSIS — I10 Essential (primary) hypertension: Secondary | ICD-10-CM

## 2020-12-01 DIAGNOSIS — J449 Chronic obstructive pulmonary disease, unspecified: Secondary | ICD-10-CM

## 2020-12-01 DIAGNOSIS — Z85828 Personal history of other malignant neoplasm of skin: Secondary | ICD-10-CM

## 2020-12-01 DIAGNOSIS — R600 Localized edema: Secondary | ICD-10-CM

## 2020-12-01 DIAGNOSIS — R2243 Localized swelling, mass and lump, lower limb, bilateral: Secondary | ICD-10-CM

## 2020-12-01 LAB — NT-PROBNPE: BKR B-TYPE NATRIURETIC PEPTIDE, PRO (PROBNP): 47 pg/mL (ref 0.0–900.0)

## 2020-12-01 LAB — CBC WITH AUTO DIFFERENTIAL
BKR WAM ABSOLUTE IMMATURE GRANULOCYTES.: 0.07 x 1000/ÂµL (ref 0.00–0.30)
BKR WAM ABSOLUTE LYMPHOCYTE COUNT.: 1.73 x 1000/ÂµL (ref 0.60–3.70)
BKR WAM ABSOLUTE NRBC (2 DEC): 0 x 1000/ÂµL (ref 0.00–1.00)
BKR WAM ANALYZER ANC: 5.23 x 1000/ÂµL (ref 2.00–7.60)
BKR WAM BASOPHIL ABSOLUTE COUNT.: 0.07 x 1000/ÂµL (ref 0.00–1.00)
BKR WAM BASOPHILS: 0.9 % (ref 0.0–1.4)
BKR WAM EOSINOPHIL ABSOLUTE COUNT.: 0.25 x 1000/ÂµL (ref 0.00–1.00)
BKR WAM EOSINOPHILS: 3.1 % (ref 0.0–5.0)
BKR WAM HEMATOCRIT (2 DEC): 47.1 % (ref 38.50–50.00)
BKR WAM HEMOGLOBIN: 14.9 g/dL (ref 13.2–17.1)
BKR WAM IMMATURE GRANULOCYTES: 0.9 % (ref 0.0–1.0)
BKR WAM LYMPHOCYTES: 21.8 % (ref 17.0–50.0)
BKR WAM MCH (PG): 27.4 pg (ref 27.0–33.0)
BKR WAM MCHC: 31.6 g/dL (ref 31.0–36.0)
BKR WAM MCV: 86.7 fL (ref 80.0–100.0)
BKR WAM MONOCYTE ABSOLUTE COUNT.: 0.59 x 1000/ÂµL (ref 0.00–1.00)
BKR WAM MONOCYTES: 7.4 % (ref 4.0–12.0)
BKR WAM MPV: 10.4 fL (ref 8.0–12.0)
BKR WAM NEUTROPHILS: 65.9 % (ref 39.0–72.0)
BKR WAM NUCLEATED RED BLOOD CELLS: 0 % (ref 0.0–1.0)
BKR WAM PLATELETS: 247 x1000/ÂµL (ref 150–420)
BKR WAM RDW-CV: 16.8 % — ABNORMAL HIGH (ref 11.0–15.0)
BKR WAM RED BLOOD CELL COUNT.: 5.43 M/ÂµL (ref 4.00–6.00)
BKR WAM WHITE BLOOD CELL COUNT: 7.9 x1000/ÂµL (ref 4.0–11.0)

## 2020-12-01 LAB — PROTIME AND INR
BKR INR: 2.15 — ABNORMAL HIGH (ref 0.88–1.14)
BKR PROTHROMBIN TIME: 21.9 s — ABNORMAL HIGH (ref 9.4–12.0)

## 2020-12-01 LAB — COMPREHENSIVE METABOLIC PANEL
BKR A/G RATIO: 0.8
BKR ALANINE AMINOTRANSFERASE (ALT): 62 U/L (ref 12–78)
BKR ALBUMIN: 3.4 g/dL (ref 3.4–5.0)
BKR ALKALINE PHOSPHATASE: 191 U/L — ABNORMAL HIGH (ref 20–135)
BKR ANION GAP: 7 (ref 5–18)
BKR ASPARTATE AMINOTRANSFERASE (AST): 45 U/L — ABNORMAL HIGH (ref 5–37)
BKR AST/ALT RATIO: 0.7
BKR BILIRUBIN TOTAL: 0.2 mg/dL (ref 0.0–1.0)
BKR BLOOD UREA NITROGEN: 13 mg/dL (ref 8–25)
BKR BUN / CREAT RATIO: 14.6 (ref 8.0–25.0)
BKR CALCIUM: 8.8 mg/dL (ref 8.4–10.3)
BKR CHLORIDE: 107 mmol/L (ref 95–115)
BKR CO2: 31 mmol/L (ref 21–32)
BKR CREATININE: 0.89 mg/dL (ref 0.50–1.30)
BKR EGFR (AFR AMER): >60 mL/min/{1.73_m2} (ref >60)
BKR EGFR (NON AFRICAN AMERICAN): >60 mL/min/{1.73_m2} (ref >60)
BKR GLOBULIN: 4.1 g/dL
BKR GLUCOSE: 107 mg/dL — ABNORMAL HIGH (ref 70–100)
BKR OSMOLALITY CALCULATION: 289 mosm/kg (ref 275–295)
BKR POTASSIUM: 3.5 mmol/L (ref 3.5–5.1)
BKR PROTEIN TOTAL: 7.5 g/dL (ref 6.4–8.2)
BKR SODIUM: 145 mmol/L (ref 136–145)

## 2020-12-01 LAB — TROPONIN I     (L Q): BKR TROPONIN I: <0.015 ng/mL (ref 0.000–0.050)

## 2020-12-01 NOTE — ED Notes
2:14 PM received pt in ed ambulatory a/ox4. C.o bilateral lower ext swelling started few days ago. 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 Reports having difficulty ambulating. Denies CP.SOB. Denies injury/trauma.  leg elevated with pillows In the room. vss/nad/nard. wctm

## 2020-12-01 NOTE — Discharge Instructions
You were seen at the [facility] Emergency Department on 12/01/2020 for bilateral lower leg swelling.Based on my assessment of your condition as well as the results of all exams, labs, and imaging it was determined there is no evidence of deep vein thrombosis (blood clot) or any new pulmonary or cardiac abnormalities that might explain your symptoms. Please follow-up with your primary care provider. They may make adjustments to your medications.We discussed wearing compression stockings and elevating your legs.If you were discharged home with new medications, please review the medication insert and call your primary care doctor with any questions or concerns.No adjustments were made to your home medication regimen.You will need to follow up with your primary care doctor within a week.  Call your doctor's office as soon as possible to schedule an appointment and to let your doctor know that your condition required a visit to the Emergency Room.Return to ED if any of the following develop:- Chest pain and/or shortness of breath- Fever above 100.4- Vomiting that does not stop- Worsening of your current symptoms- Any other symptoms that you find concerningThank you for trusting your care to [place].  Do not hesitate to return to the Emergency Department if you feel the need.
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
## 2020-12-02 NOTE — Telephone Encounter
Still about the same and reminded to elevate when possible and to f/u with his doctor

## 2020-12-06 ENCOUNTER — Encounter: Admit: 2020-12-06 | Payer: PRIVATE HEALTH INSURANCE | Attending: Medical Oncology | Primary: Family Medicine

## 2020-12-06 DIAGNOSIS — Z7901 Long term (current) use of anticoagulants: Secondary | ICD-10-CM

## 2020-12-06 DIAGNOSIS — I2699 Other pulmonary embolism without acute cor pulmonale: Secondary | ICD-10-CM

## 2020-12-06 MED ORDER — WARFARIN 1 MG TABLET
1 mg | ORAL_TABLET | Freq: Every day | ORAL | 2 refills | Status: AC
Start: 2020-12-06 — End: 2021-01-05

## 2020-12-21 ENCOUNTER — Ambulatory Visit: Admit: 2020-12-21 | Payer: PRIVATE HEALTH INSURANCE | Primary: Family Medicine

## 2020-12-21 ENCOUNTER — Inpatient Hospital Stay: Admit: 2020-12-21 | Discharge: 2020-12-21 | Payer: MEDICARE | Primary: Family Medicine

## 2020-12-21 DIAGNOSIS — I2699 Other pulmonary embolism without acute cor pulmonale: Secondary | ICD-10-CM

## 2020-12-21 MED ORDER — IOHEXOL 350 MG IODINE/ML INTRAVENOUS SOLUTION
350 mg iodine/mL | Freq: Once | INTRAVENOUS | Status: CP | PRN
Start: 2020-12-21 — End: ?
  Administered 2020-12-21: 13:00:00 350 mL via INTRAVENOUS

## 2020-12-23 ENCOUNTER — Ambulatory Visit: Admit: 2020-12-23 | Payer: MEDICARE | Attending: Medical Oncology | Primary: Family Medicine

## 2020-12-23 ENCOUNTER — Inpatient Hospital Stay: Admit: 2020-12-23 | Discharge: 2020-12-23 | Payer: MEDICARE | Primary: Family Medicine

## 2020-12-23 DIAGNOSIS — Z7901 Long term (current) use of anticoagulants: Secondary | ICD-10-CM

## 2020-12-23 DIAGNOSIS — I8291 Chronic embolism and thrombosis of unspecified vein: Secondary | ICD-10-CM

## 2020-12-23 LAB — COMPREHENSIVE METABOLIC PANEL
BKR A/G RATIO: 1
BKR ALANINE AMINOTRANSFERASE (ALT): 74 U/L (ref 12–78)
BKR ALBUMIN: 3.7 g/dL (ref 3.4–5.0)
BKR ANION GAP: 7 g/dL (ref 5–18)
BKR ASPARTATE AMINOTRANSFERASE (AST): 67 U/L — ABNORMAL HIGH (ref 5–37)
BKR BILIRUBIN TOTAL: 0.6 mg/dL (ref 0.0–1.0)
BKR BLOOD UREA NITROGEN: 15 mg/dL (ref 8–25)
BKR CALCIUM: 9.7 mg/dL (ref 8.4–10.3)
BKR CHLORIDE: 108 mmol/L (ref 95–115)
BKR CO2: 27 mmol/L (ref 21–32)
BKR CREATININE: 0.93 mg/dL (ref 0.50–1.30)
BKR EGFR (AFR AMER): >60 mL/min/{1.73_m2} (ref >60)
BKR EGFR (NON AFRICAN AMERICAN): >60 mL/min/{1.73_m2} (ref >60)
BKR GLOBULIN: 3.7 g/dL
BKR GLUCOSE: 115 mg/dL — ABNORMAL HIGH (ref 70–100)
BKR OSMOLALITY CALCULATION: 285 mosm/kg (ref 275–295)
BKR POTASSIUM: 4 mmol/L (ref 3.5–5.1)
BKR PROTEIN TOTAL: 7.4 g/dL (ref 6.4–8.2)
BKR SODIUM: 142 mmol/L (ref 136–145)

## 2020-12-23 LAB — CBC WITH AUTO DIFFERENTIAL
BKR ALKALINE PHOSPHATASE: 0.3 % — ABNORMAL HIGH (ref 0.0–1.0)
BKR AST/ALT RATIO: 0.16 x 1000/??L (ref 0.00–1.00)
BKR BUN / CREAT RATIO: 20.4 % (ref 17.0–50.0)
BKR WAM ABSOLUTE IMMATURE GRANULOCYTES.: 0.02 x 1000/??L (ref 0.00–0.30)
BKR WAM ABSOLUTE LYMPHOCYTE COUNT.: 1.33 x 1000/??L (ref 0.60–3.70)
BKR WAM ABSOLUTE NRBC (2 DEC): 0 x 1000/??L (ref 0.00–1.00)
BKR WAM ANALYZER ANC: 4.5 x 1000/??L — ABNORMAL HIGH (ref 2.00–7.60)
BKR WAM BASOPHIL ABSOLUTE COUNT.: 0.03 x 1000/??L (ref 0.00–1.00)
BKR WAM BASOPHILS: 0.5 % (ref 0.0–1.4)
BKR WAM EOSINOPHIL ABSOLUTE COUNT.: 0.16 x 1000/??L (ref 0.00–1.00)
BKR WAM EOSINOPHILS: 2.5 % (ref 0.0–5.0)
BKR WAM HEMATOCRIT (2 DEC): 47.4 % (ref 38.50–50.00)
BKR WAM HEMOGLOBIN: 15.1 g/dL (ref 13.2–17.1)
BKR WAM IMMATURE GRANULOCYTES: 0.3 % (ref 0.0–1.0)
BKR WAM LYMPHOCYTES: 20.4 % (ref 17.0–50.0)
BKR WAM MCH (PG): 28.7 pg (ref 27.0–33.0)
BKR WAM MCHC: 31.9 g/dL (ref 31.0–36.0)
BKR WAM MCV: 89.9 fL (ref 80.0–100.0)
BKR WAM MONOCYTE ABSOLUTE COUNT.: 0.48 x 1000/??L (ref 0.00–1.00)
BKR WAM MONOCYTES: 7.4 % (ref 4.0–12.0)
BKR WAM MPV: 10.9 fL (ref 8.0–12.0)
BKR WAM NEUTROPHILS: 68.9 % (ref 39.0–72.0)
BKR WAM NUCLEATED RED BLOOD CELLS: 0 % (ref 0.0–1.0)
BKR WAM PLATELETS: 243 x1000/??L (ref 150–420)
BKR WAM RDW-CV: 17.6 % — ABNORMAL HIGH (ref 11.0–15.0)
BKR WAM RED BLOOD CELL COUNT.: 5.27 M/??L (ref 4.00–6.00)
BKR WAM WHITE BLOOD CELL COUNT: 6.5 x1000/??L (ref 4.0–11.0)

## 2020-12-23 LAB — D-DIMER, QUANTITATIVE: BKR D-DIMER: 0.67 mg/L FEU — ABNORMAL HIGH (ref <0.49)

## 2020-12-23 LAB — PT/INR AND PTT (BH GH L LMW YH)
BKR INR: 1.81 — ABNORMAL HIGH (ref 0.88–1.14)
BKR PARTIAL THROMBOPLASTIN TIME: 38.7 s — ABNORMAL HIGH (ref 23.0–31.0)
BKR PROTHROMBIN TIME: 18.8 s — ABNORMAL HIGH (ref 9.4–12.0)
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
## 2020-12-24 ENCOUNTER — Encounter: Admit: 2020-12-24 | Payer: PRIVATE HEALTH INSURANCE | Primary: Family Medicine

## 2020-12-24 DIAGNOSIS — I2699 Other pulmonary embolism without acute cor pulmonale: Secondary | ICD-10-CM

## 2020-12-24 DIAGNOSIS — D649 Anemia, unspecified: Secondary | ICD-10-CM

## 2020-12-25 LAB — SPECIAL COAGULATION MD INTERPRETATION (LAB ORDERABLE ONLY) (GH YH)

## 2020-12-25 LAB — FACTOR VIII ACTIVITY: BKR FACTOR VIII ACTIVITY: 146.9 % — ABNORMAL HIGH (ref 66.0–143.0)

## 2021-01-01 ENCOUNTER — Encounter: Admit: 2021-01-01 | Payer: PRIVATE HEALTH INSURANCE | Primary: Family Medicine

## 2021-01-01 ENCOUNTER — Inpatient Hospital Stay: Admit: 2021-01-01 | Discharge: 2021-01-01 | Payer: MEDICARE | Primary: Family Medicine

## 2021-01-01 ENCOUNTER — Ambulatory Visit: Admit: 2021-01-01 | Payer: PRIVATE HEALTH INSURANCE | Primary: Family Medicine

## 2021-01-01 DIAGNOSIS — I8291 Chronic embolism and thrombosis of unspecified vein: Secondary | ICD-10-CM

## 2021-01-01 DIAGNOSIS — Z7901 Long term (current) use of anticoagulants: Secondary | ICD-10-CM

## 2021-01-01 DIAGNOSIS — I2699 Other pulmonary embolism without acute cor pulmonale: Secondary | ICD-10-CM

## 2021-01-01 LAB — POCT INR     (GH): BKR INTERNATIONAL NORMALIZATION RATIO, POC: 2.2

## 2021-01-01 MED ORDER — WARFARIN 5 MG TABLET
5 mg | ORAL_TABLET | 1 refills | Status: AC
Start: 2021-01-01 — End: 2021-02-26

## 2021-01-01 NOTE — Progress Notes
Met with the patient face to face today following POCT INR, informed that INR is therapeutic at 2.2.  He reports I took a 3mg  yesterday because I have no more pills, he reports having taken the 6mg  dose prior to yesterday.I informed him we would prescribe more coumadin and send to his pharmacy.  He was instructed to return in two weeks for recheck on INR I cant do that I wont be here, probably will be on vacation, trying to get out of here, I don't know when I will be back maybe March.I informed him I will contact him on Monday and we can discuss a plan he agreed to that and he left the office.

## 2021-01-04 ENCOUNTER — Telehealth: Admit: 2021-01-04 | Payer: PRIVATE HEALTH INSURANCE | Attending: Medical | Primary: Family Medicine

## 2021-01-05 ENCOUNTER — Encounter: Admit: 2021-01-05 | Payer: PRIVATE HEALTH INSURANCE | Primary: Family Medicine

## 2021-01-05 ENCOUNTER — Encounter: Admit: 2021-01-05 | Payer: PRIVATE HEALTH INSURANCE | Attending: Medical | Primary: Family Medicine

## 2021-01-05 DIAGNOSIS — N401 Enlarged prostate with lower urinary tract symptoms: Secondary | ICD-10-CM

## 2021-01-05 DIAGNOSIS — Z7901 Long term (current) use of anticoagulants: Secondary | ICD-10-CM

## 2021-01-05 DIAGNOSIS — I2699 Other pulmonary embolism without acute cor pulmonale: Secondary | ICD-10-CM

## 2021-01-05 MED ORDER — TADALAFIL 5 MG TABLET
5 mg | ORAL_TABLET | Freq: Every day | ORAL | 4 refills | Status: AC
Start: 2021-01-05 — End: 2021-09-13

## 2021-01-05 MED ORDER — WARFARIN 1 MG TABLET
1 mg | ORAL_TABLET | Freq: Every day | ORAL | 1 refills | Status: AC
Start: 2021-01-05 — End: ?

## 2021-01-05 NOTE — Telephone Encounter
[facility]  PRIOR AUTHORIZATION REQUEST, Please see attached form below and in media.

## 2021-01-05 NOTE — Progress Notes
SOCIAL WORK NOTEPatient Name: Henry Sweetin HelgesonMedical Record Number: [id number] Date of Birth: [birth year]Social Work Follow Up    Most Recent Value Document Type Progress Note Reason for Encounter Psycho-Social Concerns Intervention Assessment and referral to community resources  Assessment and Referral to community services Community Resources Source of Information Patient Record Reviewed Yes Level of Care Ambulatory Identified Clinical/Disposition, Issues/Barriers: Supportive Resources and assistance Intervention(s)/Summary 15 minutes spent speaking with [name] via phone. I introduced myself and the role of clinical social work as part of his care team. I identified some of the supportive resources and community aides which may be of interest and assistance to [name]. [name] declines support services at this time and I encouraged him to seek assistance should he change his mind. I also mailed a resource packet with information we had reviewed to his home address so he has a hard copy to utilize and review. Additionally, [name] communicated a prescription medication issue. I instructed him to call the nurse in addition to communicating with [name] the patient had expressed medication assistance. Collaboration with Treatment Team/Community Providers/Family: [name] Outcome Resolved Handoff Required? No Next Steps/Plan (including hand-off): Patient has my contact information and encouraged to use for future social work support Signature: Gay Filler, LCSW Contact Information: [phone]

## 2021-01-06 ENCOUNTER — Emergency Department: Admit: 2021-01-06 | Payer: PRIVATE HEALTH INSURANCE | Primary: Family Medicine

## 2021-01-06 ENCOUNTER — Inpatient Hospital Stay: Admit: 2021-01-06 | Discharge: 2021-01-06 | Payer: MEDICARE

## 2021-01-06 DIAGNOSIS — I10 Essential (primary) hypertension: Secondary | ICD-10-CM

## 2021-01-06 DIAGNOSIS — Z888 Allergy status to other drugs, medicaments and biological substances status: Secondary | ICD-10-CM

## 2021-01-06 DIAGNOSIS — I8291 Chronic embolism and thrombosis of unspecified vein: Secondary | ICD-10-CM

## 2021-01-06 DIAGNOSIS — G43909 Migraine, unspecified, not intractable, without status migrainosus: Secondary | ICD-10-CM

## 2021-01-06 DIAGNOSIS — K5900 Constipation, unspecified: Secondary | ICD-10-CM

## 2021-01-06 DIAGNOSIS — N319 Neuromuscular dysfunction of bladder, unspecified: Secondary | ICD-10-CM

## 2021-01-06 DIAGNOSIS — Z885 Allergy status to narcotic agent status: Secondary | ICD-10-CM

## 2021-01-06 DIAGNOSIS — E7800 Pure hypercholesterolemia, unspecified: Secondary | ICD-10-CM

## 2021-01-06 DIAGNOSIS — K219 Gastro-esophageal reflux disease without esophagitis: Secondary | ICD-10-CM

## 2021-01-06 DIAGNOSIS — K5641 Fecal impaction: Secondary | ICD-10-CM

## 2021-01-06 DIAGNOSIS — Z7951 Long term (current) use of inhaled steroids: Secondary | ICD-10-CM

## 2021-01-06 DIAGNOSIS — N400 Enlarged prostate without lower urinary tract symptoms: Secondary | ICD-10-CM

## 2021-01-06 DIAGNOSIS — E876 Hypokalemia: Secondary | ICD-10-CM

## 2021-01-06 DIAGNOSIS — Z87891 Personal history of nicotine dependence: Secondary | ICD-10-CM

## 2021-01-06 DIAGNOSIS — Z7901 Long term (current) use of anticoagulants: Secondary | ICD-10-CM

## 2021-01-06 DIAGNOSIS — N3001 Acute cystitis with hematuria: Secondary | ICD-10-CM

## 2021-01-06 DIAGNOSIS — J449 Chronic obstructive pulmonary disease, unspecified: Secondary | ICD-10-CM

## 2021-01-06 DIAGNOSIS — M1990 Unspecified osteoarthritis, unspecified site: Secondary | ICD-10-CM

## 2021-01-06 DIAGNOSIS — Z85828 Personal history of other malignant neoplasm of skin: Secondary | ICD-10-CM

## 2021-01-06 DIAGNOSIS — I2699 Other pulmonary embolism without acute cor pulmonale: Secondary | ICD-10-CM

## 2021-01-06 DIAGNOSIS — Z79899 Other long term (current) drug therapy: Secondary | ICD-10-CM

## 2021-01-06 LAB — CBC WITH AUTO DIFFERENTIAL
BKR WAM ABSOLUTE IMMATURE GRANULOCYTES.: 0.04 x 1000/ÂµL (ref 0.00–0.30)
BKR WAM ABSOLUTE LYMPHOCYTE COUNT.: 1.13 x 1000/ÂµL (ref 0.60–3.70)
BKR WAM ABSOLUTE NRBC (2 DEC): 0 x 1000/ÂµL (ref 0.00–1.00)
BKR WAM ANALYZER ANC: 10.39 x 1000/ÂµL — ABNORMAL HIGH (ref 2.00–7.60)
BKR WAM BASOPHIL ABSOLUTE COUNT.: 0.03 x 1000/ÂµL (ref 0.00–1.00)
BKR WAM BASOPHILS: 0.2 % (ref 0.0–1.4)
BKR WAM EOSINOPHIL ABSOLUTE COUNT.: 0 x 1000/ÂµL (ref 0.00–1.00)
BKR WAM EOSINOPHILS: 0 % (ref 0.0–5.0)
BKR WAM HEMATOCRIT (2 DEC): 48.4 % (ref 38.50–50.00)
BKR WAM HEMOGLOBIN: 16 g/dL (ref 13.2–17.1)
BKR WAM IMMATURE GRANULOCYTES: 0.3 % (ref 0.0–1.0)
BKR WAM LYMPHOCYTES: 9.3 % — ABNORMAL LOW (ref 17.0–50.0)
BKR WAM MCH (PG): 28.7 pg (ref 27.0–33.0)
BKR WAM MCHC: 33.1 g/dL (ref 31.0–36.0)
BKR WAM MCV: 86.9 fL (ref 80.0–100.0)
BKR WAM MONOCYTE ABSOLUTE COUNT.: 0.57 x 1000/ÂµL (ref 0.00–1.00)
BKR WAM MONOCYTES: 4.7 % (ref 4.0–12.0)
BKR WAM MPV: 10.7 fL (ref 8.0–12.0)
BKR WAM NEUTROPHILS: 85.5 % — ABNORMAL HIGH (ref 39.0–72.0)
BKR WAM NUCLEATED RED BLOOD CELLS: 0 % (ref 0.0–1.0)
BKR WAM PLATELETS: 282 x1000/ÂµL (ref 150–420)
BKR WAM RDW-CV: 16.1 % — ABNORMAL HIGH (ref 11.0–15.0)
BKR WAM RED BLOOD CELL COUNT.: 5.57 M/ÂµL (ref 4.00–6.00)
BKR WAM WHITE BLOOD CELL COUNT: 12.2 x1000/ÂµL — ABNORMAL HIGH (ref 4.0–11.0)

## 2021-01-06 LAB — URINE MICROSCOPIC     (BH GH LMW YH)

## 2021-01-06 LAB — COMPREHENSIVE METABOLIC PANEL
BKR A/G RATIO: 1
BKR ALANINE AMINOTRANSFERASE (ALT): 59 U/L (ref 12–78)
BKR ALBUMIN: 3.9 g/dL (ref 3.4–5.0)
BKR ALKALINE PHOSPHATASE: 194 U/L — ABNORMAL HIGH (ref 20–135)
BKR ANION GAP: 10 (ref 5–18)
BKR ASPARTATE AMINOTRANSFERASE (AST): 20 U/L (ref 5–37)
BKR AST/ALT RATIO: 0.3
BKR BILIRUBIN TOTAL: 0.7 mg/dL (ref 0.0–1.0)
BKR BLOOD UREA NITROGEN: 19 mg/dL (ref 8–25)
BKR BUN / CREAT RATIO: 18.1 (ref 8.0–25.0)
BKR CALCIUM: 9.1 mg/dL (ref 8.4–10.3)
BKR CHLORIDE: 98 mmol/L (ref 95–115)
BKR CO2: 31 mmol/L (ref 21–32)
BKR CREATININE: 1.05 mg/dL (ref 0.50–1.30)
BKR EGFR (AFR AMER): >60 mL/min/{1.73_m2} (ref >60)
BKR EGFR (NON AFRICAN AMERICAN): >60 mL/min/{1.73_m2} (ref >60)
BKR GLOBULIN: 4.1 g/dL
BKR GLUCOSE: 159 mg/dL — ABNORMAL HIGH (ref 70–100)
BKR OSMOLALITY CALCULATION: 283 mosm/kg (ref 275–295)
BKR POTASSIUM: 2.8 mmol/L — ABNORMAL LOW (ref 3.5–5.1)
BKR PROTEIN TOTAL: 8 g/dL (ref 6.4–8.2)
BKR SODIUM: 139 mmol/L (ref 136–145)

## 2021-01-06 LAB — ZZZURINALYSIS WITH CULTURE REFLEX     (L Q)
BKR BILIRUBIN, UA: NEGATIVE
BKR GLUCOSE, UA: NEGATIVE
BKR KETONES, UA: NEGATIVE
BKR LEUKOCYTE ESTERASE, UA: POSITIVE — AB
BKR NITRITE, UA: NEGATIVE
BKR PH, UA: 8 — ABNORMAL HIGH (ref 5.5–7.5)
BKR SPECIFIC GRAVITY, UA: >1.03 — ABNORMAL HIGH (ref 1.005–1.030)
BKR UROBILINOGEN, UA: <2 EU/dL (ref <=2.0)

## 2021-01-06 LAB — MAGNESIUM: BKR MAGNESIUM: 3.1 mg/dL — CR (ref 1.4–2.2)

## 2021-01-06 LAB — PROTIME AND INR
BKR INR: 1.62 — ABNORMAL HIGH (ref 0.88–1.14)
BKR PROTHROMBIN TIME: 17 s — ABNORMAL HIGH (ref 9.4–12.0)

## 2021-01-06 MED ORDER — CEFUROXIME AXETIL 500 MG TABLET
500 mg | ORAL_TABLET | Freq: Two times a day (BID) | ORAL | 1 refills | Status: AC
Start: 2021-01-06 — End: 2021-01-06

## 2021-01-06 MED ORDER — CEFUROXIME AXETIL 500 MG TABLET
500 mg | ORAL_TABLET | Freq: Two times a day (BID) | ORAL | 1 refills | Status: AC
Start: 2021-01-06 — End: ?

## 2021-01-06 MED ORDER — POTASSIUM CHLORIDE ER 20 MEQ TABLET,EXTENDED RELEASE(PART/CRYST)
20 MEQ | Freq: Once | ORAL | Status: CP
Start: 2021-01-06 — End: ?
  Administered 2021-01-06: 19:00:00 20 MEQ via ORAL

## 2021-01-06 NOTE — ED Notes
5:41 PM -Lab called at 5:41 to report Magnesium of 3.1. MD Meis notified. Patient already discharged

## 2021-01-06 NOTE — Discharge Instructions
Continue taking your Coumadin as prescribed by Dr. Nedra Hai
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
## 2021-01-07 MED ORDER — CIPROFLOXACIN 500 MG TABLET
500 mg | ORAL_TABLET | Freq: Two times a day (BID) | ORAL | 1 refills | Status: AC
Start: 2021-01-07 — End: ?

## 2021-01-07 NOTE — Other
Results for orders placed or performed during the hospital encounter of 01/06/21 Urine culture  Specimen: Urine Result Value Ref Range  Urine Culture, Routine >=100,000 CFU/mL Enterococcus  species (A)  I spoke with Infectious Disease and will switch patient to ciprofloxacin.I called the patient at home and he reports he is feeling well.He will stop the Ceftin.  He understands return for any fever chills. Christell Constant, PA02/10/22 1512

## 2021-01-07 NOTE — Telephone Encounter
I am doing just fine,thank-you. And getting my medicine

## 2021-01-08 ENCOUNTER — Telehealth: Admit: 2021-01-08 | Payer: PRIVATE HEALTH INSURANCE | Primary: Family Medicine

## 2021-01-08 ENCOUNTER — Encounter: Admit: 2021-01-08 | Payer: PRIVATE HEALTH INSURANCE | Primary: Family Medicine

## 2021-01-08 LAB — URINE CULTURE: BKR URINE CULTURE, ROUTINE: >=100000 — AB

## 2021-01-08 NOTE — Other
RX Cipro

## 2021-01-08 NOTE — Progress Notes
ONCOLOGY CARE COORDINATOR CALLRequest received from Gay Filler LCSW to determine if I could follow up with patient due to concerns with his ability to care for himself. Pt is not followed by OCM/[facility] program. Chart reviewed noted he was seen in [facility] ED on 2/9 due to complaints of constipation, no SBO. Received soaps suds enema with good effects. He was started on colace in addition to senna he was already using. Pt also diagnosed with UTI and started on Ceftin then changed to Cipro. Call placed to patient, introduced self and role of Care Coordinator. 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 He was pleasant and engaging in my call. We reviewed his recent ED visit and medications. Pt able to verbalize medications, dose and frequency. We also reviewed his ordered coumadin, again he was able to verbalize appropriate dosing. He did express frustration as he feels he was told he may come off of coumadin however he has not heard any more about this. He stated he currently has enough coumadin and plans to follow up with Dr Nedra Hai. Pt agreed for me to call again next week to check in. Staff message sent to team at [facility]. Lavone Neri RN BSNOncology Care Coordinator [employer] Direct [phone]

## 2021-01-08 NOTE — Telephone Encounter
LM for patient on his mobile VM that Dr. Nedra Hai wants him to stay on the coumadin due to the increased d dimer, he needs repeat the test at the end of the month four weeks from the last lab and then schedule a phone discussion with Dr. Nedra Hai.  ----- Message from Isaias Cowman, MD sent at 01/08/2021  4:22 PM EST -----Regarding: RE: Called patient and copied my note below. I will follow up next week.Please advise the pt to remain on warfarin due to the increased d-dimerHave him repeat the test at the end of the month - 4 weeks from the last lab and then schedule a phone discussion----- Message -----From: Baruch Goldmann, CMSent: 01/08/2021   3:32 PM ESTTo: Edd Arbour, RN, Gay Filler, LCSW, #Subject: Called patient and copied my note below. I w#Request received from Gay Filler LCSW to determine if I could follow up with patient due to concerns with his ability to care for himself. Pt is not followed by OCM/OMH program. Chart reviewed noted he was seen in [facility] ED on 2/9 due to complaints of constipation, no SBO. Received soaps suds enema with good effects. He was started on colace in addition to senna he was already using. Pt also diagnosed with UTI and started on Ceftin then changed to Cipro. Call placed to patient, introduced self and role of Care Coordinator. 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 He was pleasant and engaging in my call. We reviewed his recent ED visit and medications. Pt able to verbalize medications, dose and frequency. We also reviewed his ordered coumadin, again he was able to verbalize appropriate dosing. He did express frustration as he feels he was told he may come off of coumadin however he has not heard any more about this. He stated he currently has enough coumadin and plans to follow up with Dr Nedra Hai. Pt agreed for me to call again next week to check in. ----- Message -----From: Isaias Cowman, MDSent: 01/05/2021   1:51 PM ESTTo: Sharyn Lull Pecor, CMHe needs a lot of pyscho social help----- Message -----From: Baruch Goldmann, CMSent: 01/05/2021   1:19 PM ESTTo: Edd Arbour, RN, Gay Filler, LCSW, #Hi, This is another patient that Samara Deist thought I might be able to assist with. Please provide the concerns the team may have for this patient and again I will do my best to assist. Thank Kyra Searles

## 2021-01-13 ENCOUNTER — Encounter: Admit: 2021-01-13 | Payer: PRIVATE HEALTH INSURANCE | Primary: Family Medicine

## 2021-01-13 DIAGNOSIS — I8291 Chronic embolism and thrombosis of unspecified vein: Secondary | ICD-10-CM

## 2021-01-13 DIAGNOSIS — I2782 Chronic pulmonary embolism: Secondary | ICD-10-CM

## 2021-01-13 DIAGNOSIS — Z7901 Long term (current) use of anticoagulants: Secondary | ICD-10-CM

## 2021-01-13 NOTE — Progress Notes
ONCOLOGY CARE COORDINATOR CALLSpoke with patient for follow up call. We reviewed plan for coumadin per Kris Mouton message to him. He verbalized he will stay on coumadin and have repeat labs at the end of the month. We also reviewed his bowel regime which he informed me his pain specialist Dr Forde Radon called him and instructed him to stop senna/colace and start Movantik 25mg  daily. Pt has started this medication and he reported while he has bms they are small amount and hard. He elected to stop his iron until his bms are regular. I reviewed importance of continuing iron, however pt stated he will take only after he is regular. He verbalized frustration stating I am being told one thing from one person then another by someone else. I explained I would send update to Dr Nedra Hai and Dolly Rias RN for review. Pt verbalized understanding and appreciation for call. Pt is pleasant during calls and appears to understand instructions provided as he repeats instructions appropriately, however he verbalizes he gets frustrated with different orders by 2 teams. Staff message to Dr Lee/B. Kathrynn Ducking RN. Lavone Neri RN BSNOncology Care Coordinator [employer] Direct [phone]

## 2021-01-15 ENCOUNTER — Encounter: Admit: 2021-01-15 | Payer: PRIVATE HEALTH INSURANCE | Primary: Family Medicine

## 2021-01-15 DIAGNOSIS — I8291 Chronic embolism and thrombosis of unspecified vein: Secondary | ICD-10-CM

## 2021-01-20 ENCOUNTER — Encounter: Admit: 2021-01-20 | Payer: PRIVATE HEALTH INSURANCE | Primary: Family Medicine

## 2021-01-20 NOTE — Progress Notes
ONCOLOGY CARE COORDINATION CALLSpoke with patient today for follow up from previous call. Patient reports he again experienced constipation this past Monday required an enema at home. He did not require ED visit. Pt reports he was seen by his PCP Dr Mercy Riding and now has resumed senna and colace along with addition of lactulose daily as well as previously ordered Movantic. Pt reports if he should experience diarrhea then he will hold lactulose. Additionally he stated he reviewed with Dr Mercy Riding and dcd hydrochlorothiazide. I inquired why this med was dcd as this is a blood pressure medication, pt stated since it can cause constipation he is refusing to continue taking. Again reminded him this medication was for control of BP, pt firmly stated I am not going to take the medication and you can not convince me otherwise and I already reviewed this with Dr Mercy Riding He also reported his BP was 116/70. We reviewed next visit for scheduled labs and scans. Reminded pt I would call him next week to check in, verbalized understanding. Lavone Neri RN BSNOncology Care Coordinator [employer] Direct [phone]

## 2021-01-27 ENCOUNTER — Encounter: Admit: 2021-01-27 | Payer: PRIVATE HEALTH INSURANCE | Primary: Family Medicine

## 2021-01-27 NOTE — Progress Notes
ONCOLOGY CARE COORDINATION CALLSpoke with patient for follow up. Pt reports I'm fine denies constipation and stated his pain is the same no change. Inquired if he has had any medication changes since last week he denied any new changes. He did report he was tired and compared to previous calls he was less engaging on today's call. Encouraged pt to call for questions or concerns. Lavone Neri RN BSNOncology Care Coordinator [employer] Direct [phone]

## 2021-01-29 ENCOUNTER — Ambulatory Visit: Admit: 2021-01-29 | Payer: PRIVATE HEALTH INSURANCE | Primary: Family Medicine

## 2021-01-29 ENCOUNTER — Ambulatory Visit: Admit: 2021-01-29 | Payer: PRIVATE HEALTH INSURANCE | Attending: Medical Oncology | Primary: Family Medicine

## 2021-01-29 ENCOUNTER — Inpatient Hospital Stay: Admit: 2021-01-29 | Discharge: 2021-01-29 | Payer: MEDICARE | Primary: Family Medicine

## 2021-01-29 DIAGNOSIS — I2699 Other pulmonary embolism without acute cor pulmonale: Secondary | ICD-10-CM

## 2021-01-29 DIAGNOSIS — I8291 Chronic embolism and thrombosis of unspecified vein: Secondary | ICD-10-CM

## 2021-01-29 DIAGNOSIS — Z7901 Long term (current) use of anticoagulants: Secondary | ICD-10-CM

## 2021-01-29 LAB — POCT INR     (GH): BKR INTERNATIONAL NORMALIZATION RATIO, POC: 1.9

## 2021-02-02 ENCOUNTER — Encounter: Admit: 2021-02-02 | Payer: PRIVATE HEALTH INSURANCE | Primary: Family Medicine

## 2021-02-10 ENCOUNTER — Encounter: Admit: 2021-02-10 | Payer: PRIVATE HEALTH INSURANCE | Primary: Family Medicine

## 2021-02-10 NOTE — Progress Notes
ONCOLOGY CARE COORDINATOR FOLLOW UP CALL Voice message left for patient with my contact information encouraging return call for any concerns or questions.  Jessicalynn Deshong RN BSNOncology Care Coordinator [facility] Direct [phone]

## 2021-02-18 ENCOUNTER — Encounter: Admit: 2021-02-18 | Payer: PRIVATE HEALTH INSURANCE | Primary: Family Medicine

## 2021-02-18 NOTE — Progress Notes
Pringle, Philis Pique, Arlie Solomons, MD  Cc: Edd Arbour, RN; Genesi Stefanko Cower Fredric Mare, NP  Hello,   Patient u/s is cleared no auth req with an ABN CONFLICT meaning diagnosis code   Is not covered under medicare guidelines     Thank you

## 2021-02-23 ENCOUNTER — Encounter: Admit: 2021-02-23 | Payer: PRIVATE HEALTH INSURANCE | Primary: Family Medicine

## 2021-02-23 ENCOUNTER — Telehealth: Admit: 2021-02-23 | Payer: PRIVATE HEALTH INSURANCE | Attending: Medical Oncology | Primary: Family Medicine

## 2021-02-23 DIAGNOSIS — R600 Localized edema: Secondary | ICD-10-CM

## 2021-02-23 NOTE — Telephone Encounter
Returned call to [facility] informed that new order was placed for [place].

## 2021-02-23 NOTE — Progress Notes
ONCOLOGY CARE COORDINATOR CALLCall to patient as follow up to assess for symptoms. Pt stated he is fine denies any new symptoms, again he was less engaging on today's call. He stated he had no constipation. Confirmed upcoming appointments for scans and follow with Dr Nedra Hai. No Care Coordination needs identified for past several calls. Future calls will be done as needed per team request. Lavone Neri RN BSNOncology Care Coordinator [facility] Direct [phone]

## 2021-02-23 NOTE — Telephone Encounter
Sonia called.  Per sonia:  Would like a call back about diagnosis codes for [name]'s upcomming ultrasound appointment.

## 2021-02-24 ENCOUNTER — Inpatient Hospital Stay: Admit: 2021-02-24 | Discharge: 2021-02-24 | Payer: MEDICARE | Primary: Family Medicine

## 2021-02-24 DIAGNOSIS — I2782 Chronic pulmonary embolism: Secondary | ICD-10-CM

## 2021-02-24 MED ORDER — IOHEXOL 350 MG IODINE/ML INTRAVENOUS SOLUTION
350 mg iodine/mL | Freq: Once | INTRAVENOUS | Status: CP | PRN
Start: 2021-02-24 — End: ?
  Administered 2021-02-24: 15:00:00 350 mL via INTRAVENOUS

## 2021-02-26 ENCOUNTER — Encounter: Admit: 2021-02-26 | Payer: PRIVATE HEALTH INSURANCE | Attending: Adult Health | Primary: Family Medicine

## 2021-02-26 ENCOUNTER — Inpatient Hospital Stay: Admit: 2021-02-26 | Discharge: 2021-02-26 | Payer: MEDICARE | Primary: Family Medicine

## 2021-02-26 ENCOUNTER — Ambulatory Visit: Admit: 2021-02-26 | Payer: MEDICARE | Attending: Medical Oncology | Primary: Family Medicine

## 2021-02-26 DIAGNOSIS — Z7901 Long term (current) use of anticoagulants: Secondary | ICD-10-CM

## 2021-02-26 DIAGNOSIS — I8291 Chronic embolism and thrombosis of unspecified vein: Secondary | ICD-10-CM

## 2021-02-26 DIAGNOSIS — I2699 Other pulmonary embolism without acute cor pulmonale: Secondary | ICD-10-CM

## 2021-02-26 LAB — CBC WITH AUTO DIFFERENTIAL
BKR BASOPHILS: 0.4 % (ref 0.0–1.0)
BKR CHLORIDE: 89.8 fL (ref 80.0–100.0)
BKR EOSINOPHILS: 5.2 % — ABNORMAL HIGH (ref 0.0–5.0)
BKR HEMATOCRIT: 51.3 % — ABNORMAL HIGH (ref 38.50–50.00)
BKR HEMOGLOBIN: 16.5 g/dL (ref 13.2–17.1)
BKR LYMPHOCYTES: 22.1 % (ref 17.0–50.0)
BKR MCH: 28.9 pg (ref 27.0–33.0)
BKR MCHC: 32.2 g/dL — ABNORMAL HIGH (ref 31.0–36.0)
BKR MCV: 89.8 fL (ref 80.0–100.0)
BKR MONOCYTES: 6.6 % (ref 4.0–12.0)
BKR MPV: 10.4 fL (ref 8.0–12.0)
BKR NEUTROPHILS: 65.7 % (ref 39.0–72.0)
BKR PLATELETS: 260 x 1000/??L (ref 150–420)
BKR RDW-CV: 15.2 % — ABNORMAL HIGH (ref 11.0–15.0)
BKR WAM ANALYZER ANC: 5.41 x 1000/??L (ref 2.00–7.60)
BKR WAM RED BLOOD CELL COUNT.: 5.71 M/??L (ref 4.00–6.00)
BKR WHITE BLOOD CELL COUNT: 8.2 x1000/??L (ref 4.0–11.0)

## 2021-02-26 LAB — COMPREHENSIVE METABOLIC PANEL
BKR A/G RATIO: 1
BKR ALANINE AMINOTRANSFERASE (ALT): 67 U/L (ref 12–78)
BKR ALBUMIN: 3.6 g/dL (ref 3.4–5.0)
BKR ALKALINE PHOSPHATASE: 185 U/L — ABNORMAL HIGH (ref 20–135)
BKR ANION GAP: 7 (ref 5–18)
BKR ASPARTATE AMINOTRANSFERASE (AST): 46 U/L — ABNORMAL HIGH (ref 5–37)
BKR AST/ALT RATIO: 0.7
BKR BILIRUBIN TOTAL: 0.4 mg/dL (ref 0.0–1.0)
BKR BLOOD UREA NITROGEN: 17 mg/dL (ref 8–25)
BKR BUN / CREAT RATIO: 17.3 (ref 8.0–25.0)
BKR CALCIUM: 9.5 mg/dL (ref 8.4–10.3)
BKR CO2: 28 mmol/L — ABNORMAL HIGH (ref 21–32)
BKR CREATININE: 0.98 mg/dL (ref 0.50–1.30)
BKR EGFR (AFR AMER): >60 mL/min/{1.73_m2} (ref >60)
BKR EGFR (NON AFRICAN AMERICAN): >60 mL/min/{1.73_m2} (ref >60)
BKR GLOBULIN: 3.7 g/dL
BKR GLUCOSE: 132 mg/dL — ABNORMAL HIGH (ref 70–100)
BKR OSMOLALITY CALCULATION: 287 mOsm/kg (ref 275–295)
BKR POTASSIUM: 3.5 mmol/L — ABNORMAL HIGH (ref 3.5–5.1)
BKR PROTEIN TOTAL: 7.3 g/dL (ref 6.4–8.2)
BKR SCINT ELECTRONIC SIGNATURE: 17 mg/dL (ref 8–25)
BKR SODIUM: 142 mmol/L (ref 136–145)

## 2021-02-26 LAB — D-DIMER, QUANTITATIVE: BKR D-DIMER: 0.45 mg/L FEU (ref <0.49)

## 2021-02-26 LAB — POCT INR     (GH): BKR INTERNATIONAL NORMALIZATION RATIO, POC: 3.1

## 2021-02-26 LAB — PT/INR AND PTT (BH GH L LMW YH)
BKR INR: 3.59 — ABNORMAL HIGH (ref 0.88–1.14)
BKR PARTIAL THROMBOPLASTIN TIME: 47.4 seconds — ABNORMAL HIGH (ref 23.0–31.0)
BKR PROTHROMBIN TIME: 34.8 s — ABNORMAL HIGH (ref 9.4–12.0)

## 2021-02-26 MED ORDER — APIXABAN 5 MG TABLET
5 mg | ORAL_TABLET | Freq: Two times a day (BID) | ORAL | 6 refills | Status: AC
Start: 2021-02-26 — End: 2021-08-23
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
## 2021-03-01 LAB — CARDIOLIPIN ABS, IGA, IGG, IGM     (BH GH LMW Q YH)
CARDIOLIPIN IGA: <2 APL-U/mL (ref <20.0)
CARDIOLIPIN IGG: <2 GPL-U/mL (ref <20.0)
CARDIOLIPIN IGM: 2 [MPL'U]/mL (ref <20.0)

## 2021-03-01 LAB — MIXING STUDY PTT     (BH GH YH)
BKR PTT 1:1 MIX 0": 30.3 seconds (ref 22.0–35.0)
BKR PTT 1:1 MIX 60": 31.8 s (ref 22.0–35.0)
BKR PTT 1:1 MIX CONTROL 0": 26.8 s
BKR PTT 1:1 MIX CONTROL 60": 28.6 s

## 2021-03-01 LAB — PTT- POLYBRENE (LAB ORDERABLE ONLY) (YH): BKR PTT - POLYBRENE: 52

## 2021-03-01 LAB — FACTOR VIII ACTIVITY: BKR FACTOR VIII ACTIVITY: 184.6 % — ABNORMAL HIGH (ref 66.0–143.0)

## 2021-03-01 LAB — LUPUS ANTICOAGULANT     (BH GH L LMW YH)
BKR DRV NORMALIZED RATIO: 1.45 — ABNORMAL HIGH (ref <=1.20)
BKR SCT NORMALIZED RATIO: 0.98 (ref <=1.20)

## 2021-03-01 LAB — SPECIAL COAGULATION MD INTERPRETATION (LAB ORDERABLE ONLY) (GH YH)

## 2021-03-01 LAB — BETA-2 GLYCOPROTEIN 1 ABS,IGG, IGA, IGM
BKR B2G IGA: 3.1 U/mL (ref <7.0)
BKR B2G IGG: 1.5 U/mL (ref <7.0)
BKR B2G IGM: <2.4 U/mL (ref <7.0)

## 2021-03-03 ENCOUNTER — Inpatient Hospital Stay: Admit: 2021-03-03 | Discharge: 2021-03-03 | Payer: MEDICARE | Primary: Family Medicine

## 2021-03-03 ENCOUNTER — Telehealth: Admit: 2021-03-03 | Payer: PRIVATE HEALTH INSURANCE | Primary: Family Medicine

## 2021-03-03 DIAGNOSIS — I2699 Other pulmonary embolism without acute cor pulmonale: Secondary | ICD-10-CM

## 2021-03-03 DIAGNOSIS — R600 Localized edema: Secondary | ICD-10-CM

## 2021-03-03 NOTE — Telephone Encounter
-----   Message from Isaias Cowman, MD sent at 03/03/2021  4:03 PM EDT -----Please tell him that the ultrasound is negative for [facility] him return for follow-up visit and blood test in 6 weeks

## 2021-03-03 NOTE — Telephone Encounter
Patient informed that [place] is negative for DVT  Patient scheduled follow up visit and blood test in six weeks.----- Message from Isaias Cowman, MD sent at 03/03/2021  4:03 PM EDT -----Please tell him that the ultrasound is negative for [facility] him return for follow-up visit and blood test in 6 weeks

## 2021-04-16 ENCOUNTER — Ambulatory Visit: Admit: 2021-04-16 | Payer: PRIVATE HEALTH INSURANCE | Attending: Medical Oncology | Primary: Family Medicine

## 2021-04-16 ENCOUNTER — Ambulatory Visit: Admit: 2021-04-16 | Payer: PRIVATE HEALTH INSURANCE | Primary: Family Medicine

## 2021-05-04 ENCOUNTER — Inpatient Hospital Stay: Admit: 2021-05-04 | Discharge: 2021-05-04 | Payer: MEDICARE | Primary: Family Medicine

## 2021-05-04 ENCOUNTER — Ambulatory Visit: Admit: 2021-05-04 | Payer: MEDICARE | Attending: Adult Health | Primary: Family Medicine

## 2021-05-04 ENCOUNTER — Ambulatory Visit: Admit: 2021-05-04 | Payer: MEDICARE | Attending: Medical Oncology | Primary: Family Medicine

## 2021-05-04 ENCOUNTER — Encounter: Admit: 2021-05-04 | Payer: PRIVATE HEALTH INSURANCE | Attending: Adult Health | Primary: Family Medicine

## 2021-05-04 DIAGNOSIS — N319 Neuromuscular dysfunction of bladder, unspecified: Secondary | ICD-10-CM

## 2021-05-04 DIAGNOSIS — K760 Fatty (change of) liver, not elsewhere classified: Secondary | ICD-10-CM

## 2021-05-04 DIAGNOSIS — C4491 Basal cell carcinoma of skin, unspecified: Secondary | ICD-10-CM

## 2021-05-04 DIAGNOSIS — N390 Urinary tract infection, site not specified: Secondary | ICD-10-CM

## 2021-05-04 DIAGNOSIS — I10 Essential (primary) hypertension: Secondary | ICD-10-CM

## 2021-05-04 DIAGNOSIS — N23 Unspecified renal colic: Secondary | ICD-10-CM

## 2021-05-04 DIAGNOSIS — J449 Chronic obstructive pulmonary disease, unspecified: Secondary | ICD-10-CM

## 2021-05-04 DIAGNOSIS — R092 Respiratory arrest: Secondary | ICD-10-CM

## 2021-05-04 DIAGNOSIS — M1990 Unspecified osteoarthritis, unspecified site: Secondary | ICD-10-CM

## 2021-05-04 DIAGNOSIS — E7800 Pure hypercholesterolemia, unspecified: Secondary | ICD-10-CM

## 2021-05-04 DIAGNOSIS — R319 Hematuria, unspecified: Secondary | ICD-10-CM

## 2021-05-04 DIAGNOSIS — I2699 Other pulmonary embolism without acute cor pulmonale: Secondary | ICD-10-CM

## 2021-05-04 DIAGNOSIS — K219 Gastro-esophageal reflux disease without esophagitis: Secondary | ICD-10-CM

## 2021-05-04 DIAGNOSIS — R600 Localized edema: Secondary | ICD-10-CM

## 2021-05-04 DIAGNOSIS — M720 Palmar fascial fibromatosis [Dupuytren]: Secondary | ICD-10-CM

## 2021-05-04 DIAGNOSIS — N400 Enlarged prostate without lower urinary tract symptoms: Secondary | ICD-10-CM

## 2021-05-04 DIAGNOSIS — Z7901 Long term (current) use of anticoagulants: Secondary | ICD-10-CM

## 2021-05-04 DIAGNOSIS — R3989 Other symptoms and signs involving the genitourinary system: Secondary | ICD-10-CM

## 2021-05-04 DIAGNOSIS — I2782 Chronic pulmonary embolism: Secondary | ICD-10-CM

## 2021-05-04 DIAGNOSIS — S060XAA Concussion: Secondary | ICD-10-CM

## 2021-05-04 DIAGNOSIS — Z8711 Personal history of peptic ulcer disease: Secondary | ICD-10-CM

## 2021-05-04 DIAGNOSIS — G43909 Migraine, unspecified, not intractable, without status migrainosus: Secondary | ICD-10-CM

## 2021-05-04 DIAGNOSIS — A419 Sepsis, unspecified organism: Secondary | ICD-10-CM

## 2021-05-04 DIAGNOSIS — Z5189 Encounter for other specified aftercare: Secondary | ICD-10-CM

## 2021-05-04 LAB — COMPREHENSIVE METABOLIC PANEL
BKR A/G RATIO: 1.3
BKR ALANINE AMINOTRANSFERASE (ALT): 66 U/L (ref 12–78)
BKR ALBUMIN: 3.6 g/dL (ref 3.4–5.0)
BKR ALKALINE PHOSPHATASE: 173 U/L — ABNORMAL HIGH (ref 20–135)
BKR ANION GAP: 8 (ref 5–18)
BKR ASPARTATE AMINOTRANSFERASE (AST): 40 U/L — ABNORMAL HIGH (ref 5–37)
BKR AST/ALT RATIO: 0.6
BKR BILIRUBIN TOTAL: 0.9 mg/dL (ref 0.0–1.0)
BKR BLOOD UREA NITROGEN: 23 mg/dL (ref 8–25)
BKR BUN / CREAT RATIO: 28.4 — ABNORMAL HIGH (ref 8.0–25.0)
BKR CALCIUM: 8.6 mg/dL — ABNORMAL HIGH (ref 8.4–10.3)
BKR CHLORIDE: 104 mmol/L (ref 95–115)
BKR CO2: 27 mmol/L (ref 21–32)
BKR CREATININE: 0.81 mg/dL (ref 0.50–1.30)
BKR EGFR (AFR AMER): >60 mL/min/{1.73_m2} (ref >60)
BKR EGFR (NON AFRICAN AMERICAN): >60 mL/min/{1.73_m2} (ref >60)
BKR GLOBULIN: 2.8 g/dL
BKR GLUCOSE: 130 mg/dL — ABNORMAL HIGH (ref 70–100)
BKR OSMOLALITY CALCULATION: 283 mOsm/kg (ref 275–295)
BKR POTASSIUM: 3.3 mmol/L — ABNORMAL LOW (ref 3.5–5.1)
BKR PROTEIN TOTAL: 6.4 g/dL (ref 6.4–8.2)
BKR SODIUM: 139 mmol/L (ref 136–145)

## 2021-05-04 LAB — CBC WITH AUTO DIFFERENTIAL
BKR BASOPHILS: 0.1 % (ref 0.0–1.0)
BKR EOSINOPHILS: 0.8 % (ref 0.0–5.0)
BKR HEMATOCRIT: 45.2 % — ABNORMAL LOW (ref 38.50–50.00)
BKR HEMOGLOBIN: 15.1 g/dL (ref 13.2–17.1)
BKR LYMPHOCYTES: 12.7 % — ABNORMAL LOW (ref 17.0–50.0)
BKR MCH: 30 pg (ref 27.0–33.0)
BKR MCHC: 33.4 g/dL (ref 31.0–36.0)
BKR MCV: 89.9 fL (ref 80.0–100.0)
BKR MONOCYTES: 4.2 % (ref 4.0–12.0)
BKR MPV: 9.9 fL (ref 8.0–12.0)
BKR NEUTROPHILS: 82.2 % — ABNORMAL HIGH (ref 39.0–72.0)
BKR PLATELETS: 221 x 1000/??L (ref 150–420)
BKR RDW-CV: 15.7 % — ABNORMAL HIGH (ref 11.0–15.0)
BKR WAM ANALYZER ANC: 11.86 x 1000/??L — ABNORMAL HIGH (ref 2.00–7.60)
BKR WAM RED BLOOD CELL COUNT.: 5.03 M/??L (ref 4.00–6.00)
BKR WHITE BLOOD CELL COUNT: 14.4 x1000/??L — ABNORMAL HIGH (ref 4.0–11.0)

## 2021-05-04 LAB — PROTIME AND INR
BKR INR: 0.93 (ref 0.88–1.14)
BKR PROTHROMBIN TIME: 10.3 seconds (ref 9.4–12.0)

## 2021-05-04 LAB — D-DIMER, QUANTITATIVE: BKR D-DIMER: 0.47 mg/L FEU (ref <0.49)

## 2021-05-04 LAB — POCT INR     (GH): BKR INTERNATIONAL NORMALIZATION RATIO, POC: 1.1

## 2021-05-04 LAB — PARTIAL THROMBOPLASTIN TIME     (BH GH LMW Q YH): BKR PARTIAL THROMBOPLASTIN TIME: 29.5 seconds (ref 23.0–31.0)

## 2021-05-04 MED ORDER — POTASSIUM CHLORIDE ER 10 MEQ TABLET,EXTENDED RELEASE
10 MEQ | Status: AC
Start: 2021-05-04 — End: 2021-11-26

## 2021-05-04 MED ORDER — CYCLOBENZAPRINE 10 MG TABLET
10 mg | Status: AC
Start: 2021-05-04 — End: 2021-12-28

## 2021-05-04 MED ORDER — ALPRAZOLAM 0.5 MG TABLET
0.5 mg | Status: AC
Start: 2021-05-04 — End: ?
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
## 2021-05-05 ENCOUNTER — Telehealth: Admit: 2021-05-05 | Payer: PRIVATE HEALTH INSURANCE | Attending: Adult Health | Primary: Family Medicine

## 2021-05-05 LAB — SPECIAL COAGULATION MD INTERPRETATION (LAB ORDERABLE ONLY) (GH YH)

## 2021-05-05 LAB — FACTOR VIII ACTIVITY: BKR FACTOR VIII ACTIVITY: 161 % — ABNORMAL HIGH (ref 66.0–143.0)

## 2021-05-05 NOTE — Telephone Encounter
Called patient to let him know his Factor VIII remains elevated at 151 (as compared to 185 in 02/2021); his D dimer and PT/PTT/INR are WNL. Also let him know his WBC and ANC are elevated. He has not had any fevers but is achy today and has had more difficulty breathing over the past 2 days since he did a lot of weeding 2 days ago.Reviewed with Dr. Nedra Hai; he recommends patient continue Eliquis the sameGiven elevated WBC, he recommends patient follow up with Dr. Billie Ruddy and with his PMD Dr. Nash Dimmer is scheduled for repeat CTA and follow with Dr. Nedra Hai thereafter to review results. Patient verbalizes understanding.

## 2021-06-03 ENCOUNTER — Inpatient Hospital Stay: Admit: 2021-06-03 | Discharge: 2021-06-03 | Payer: MEDICARE | Primary: Family Medicine

## 2021-06-03 DIAGNOSIS — I2782 Chronic pulmonary embolism: Secondary | ICD-10-CM

## 2021-06-03 MED ORDER — IOHEXOL 350 MG IODINE/ML INTRAVENOUS SOLUTION
350 mg iodine/mL | Freq: Once | INTRAVENOUS | Status: CP | PRN
Start: 2021-06-03 — End: ?
  Administered 2021-06-03: 12:00:00 350 mL via INTRAVENOUS

## 2021-06-08 ENCOUNTER — Ambulatory Visit: Admit: 2021-06-08 | Payer: MEDICARE | Attending: Medical Oncology | Primary: Family Medicine

## 2021-06-08 DIAGNOSIS — I2782 Chronic pulmonary embolism: Secondary | ICD-10-CM
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
## 2021-06-09 DIAGNOSIS — R7989 Other specified abnormal findings of blood chemistry: Secondary | ICD-10-CM

## 2021-07-02 ENCOUNTER — Telehealth: Admit: 2021-07-02 | Payer: PRIVATE HEALTH INSURANCE | Attending: Medical | Primary: Family Medicine

## 2021-07-02 NOTE — Telephone Encounter
I have uploaded a form that needs to be printed signed and faxed back. You will be able to find the form in Media under the unknown section it has todays date and is from [employer].

## 2021-07-26 ENCOUNTER — Telehealth: Admit: 2021-07-26 | Payer: PRIVATE HEALTH INSURANCE | Attending: Medical | Primary: Family Medicine

## 2021-07-26 NOTE — Telephone Encounter
We received a fax for [name] to fill out once completed please fax back it is located in Careers information officer with an unknown date

## 2021-07-30 ENCOUNTER — Encounter: Admit: 2021-07-30 | Payer: PRIVATE HEALTH INSURANCE | Attending: Medical | Primary: Family Medicine

## 2021-07-30 ENCOUNTER — Ambulatory Visit: Admit: 2021-07-30 | Payer: MEDICARE | Attending: Medical | Primary: Family Medicine

## 2021-07-30 DIAGNOSIS — Z8711 Personal history of peptic ulcer disease: Secondary | ICD-10-CM

## 2021-07-30 DIAGNOSIS — N390 Urinary tract infection, site not specified: Secondary | ICD-10-CM

## 2021-07-30 DIAGNOSIS — N312 Flaccid neuropathic bladder, not elsewhere classified: Secondary | ICD-10-CM

## 2021-07-30 DIAGNOSIS — M1990 Unspecified osteoarthritis, unspecified site: Secondary | ICD-10-CM

## 2021-07-30 DIAGNOSIS — N23 Unspecified renal colic: Secondary | ICD-10-CM

## 2021-07-30 DIAGNOSIS — R300 Dysuria: Secondary | ICD-10-CM

## 2021-07-30 DIAGNOSIS — K760 Fatty (change of) liver, not elsewhere classified: Secondary | ICD-10-CM

## 2021-07-30 DIAGNOSIS — R092 Respiratory arrest: Secondary | ICD-10-CM

## 2021-07-30 DIAGNOSIS — J449 Chronic obstructive pulmonary disease, unspecified: Secondary | ICD-10-CM

## 2021-07-30 DIAGNOSIS — K219 Gastro-esophageal reflux disease without esophagitis: Secondary | ICD-10-CM

## 2021-07-30 DIAGNOSIS — N400 Enlarged prostate without lower urinary tract symptoms: Secondary | ICD-10-CM

## 2021-07-30 DIAGNOSIS — M720 Palmar fascial fibromatosis [Dupuytren]: Secondary | ICD-10-CM

## 2021-07-30 DIAGNOSIS — C4491 Basal cell carcinoma of skin, unspecified: Secondary | ICD-10-CM

## 2021-07-30 DIAGNOSIS — G43909 Migraine, unspecified, not intractable, without status migrainosus: Secondary | ICD-10-CM

## 2021-07-30 DIAGNOSIS — I10 Essential (primary) hypertension: Secondary | ICD-10-CM

## 2021-07-30 DIAGNOSIS — S060XAA Concussion: Secondary | ICD-10-CM

## 2021-07-30 DIAGNOSIS — E7800 Pure hypercholesterolemia, unspecified: Secondary | ICD-10-CM

## 2021-07-30 DIAGNOSIS — R319 Hematuria, unspecified: Secondary | ICD-10-CM

## 2021-07-30 DIAGNOSIS — A419 Sepsis, unspecified organism: Secondary | ICD-10-CM

## 2021-07-30 DIAGNOSIS — Z125 Encounter for screening for malignant neoplasm of prostate: Secondary | ICD-10-CM

## 2021-07-30 DIAGNOSIS — R3989 Other symptoms and signs involving the genitourinary system: Secondary | ICD-10-CM

## 2021-07-30 DIAGNOSIS — N319 Neuromuscular dysfunction of bladder, unspecified: Secondary | ICD-10-CM

## 2021-07-30 DIAGNOSIS — Z5189 Encounter for other specified aftercare: Secondary | ICD-10-CM

## 2021-07-30 LAB — URINE MICROSCOPIC     (BH GH LMW YH)

## 2021-07-30 LAB — ZZZURINALYSIS WITH CULTURE REFLEX     (L Q)
BKR BILIRUBIN, UA: NEGATIVE
BKR GLUCOSE, UA: NEGATIVE
BKR KETONES, UA: NEGATIVE
BKR LEUKOCYTE ESTERASE, UA: POSITIVE — AB
BKR NITRITE, UA: NEGATIVE
BKR PH, UA: 7.5 /LPF (ref 5.5–7.5)
BKR SPECIFIC GRAVITY, UA: 1.015 (ref 1.005–1.030)

## 2021-07-30 MED ORDER — CIPROFLOXACIN 500 MG TABLET
500 mg | ORAL_TABLET | Freq: Two times a day (BID) | ORAL | 1 refills | Status: AC
Start: 2021-07-30 — End: 2021-09-10
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
## 2021-08-01 LAB — URINE CULTURE: BKR URINE CULTURE, ROUTINE: >=100000 — AB

## 2021-08-23 ENCOUNTER — Encounter: Admit: 2021-08-23 | Payer: PRIVATE HEALTH INSURANCE | Attending: Medical Oncology | Primary: Family Medicine

## 2021-08-23 MED ORDER — ELIQUIS 5 MG TABLET
5 mg | ORAL_TABLET | 6 refills | Status: AC
Start: 2021-08-23 — End: 2022-01-31

## 2021-08-25 ENCOUNTER — Encounter: Admit: 2021-08-25 | Payer: PRIVATE HEALTH INSURANCE | Attending: Medical | Primary: Family Medicine

## 2021-08-25 NOTE — Telephone Encounter
Received call from medical supply regarding catheters, seen by Simonne Come on 07/30/21, looking to get forms filled out for supplies.. thank you

## 2021-08-26 NOTE — Telephone Encounter
Please find out what they need from us.

## 2021-08-31 ENCOUNTER — Telehealth: Admit: 2021-08-31 | Payer: PRIVATE HEALTH INSURANCE | Attending: Medical | Primary: Family Medicine

## 2021-08-31 NOTE — Telephone Encounter
Jasmine December -RA Elijah Birk is requesting a order forRquest for pt cathreter supplies for  the pt. The form has already been sent to the office. She needs a call back today to discuss  She is asking for a call back to see if the forms have been filled out. She can be reached at  [phone]

## 2021-08-31 NOTE — Telephone Encounter
Patient needs LETTER OF MEDICAL NECESSITY(CATHETERS) filled out and signed.Letter in MEDIA.

## 2021-09-02 NOTE — Telephone Encounter
Jasmine December calling from [employer] again to get forms for cath supplies.Patient needs them ASAPPlease sign and send fax #[phone]

## 2021-09-10 ENCOUNTER — Encounter: Admit: 2021-09-10 | Payer: PRIVATE HEALTH INSURANCE | Primary: Family Medicine

## 2021-09-10 ENCOUNTER — Ambulatory Visit: Admit: 2021-09-10 | Payer: MEDICARE | Attending: Medical | Primary: Family Medicine

## 2021-09-10 ENCOUNTER — Encounter: Admit: 2021-09-10 | Payer: PRIVATE HEALTH INSURANCE | Attending: Medical | Primary: Family Medicine

## 2021-09-10 DIAGNOSIS — Z5189 Encounter for other specified aftercare: Secondary | ICD-10-CM

## 2021-09-10 DIAGNOSIS — Z7901 Long term (current) use of anticoagulants: Secondary | ICD-10-CM

## 2021-09-10 DIAGNOSIS — R3989 Other symptoms and signs involving the genitourinary system: Secondary | ICD-10-CM

## 2021-09-10 DIAGNOSIS — N390 Urinary tract infection, site not specified: Secondary | ICD-10-CM

## 2021-09-10 DIAGNOSIS — R092 Respiratory arrest: Secondary | ICD-10-CM

## 2021-09-10 DIAGNOSIS — K219 Gastro-esophageal reflux disease without esophagitis: Secondary | ICD-10-CM

## 2021-09-10 DIAGNOSIS — G43909 Migraine, unspecified, not intractable, without status migrainosus: Secondary | ICD-10-CM

## 2021-09-10 DIAGNOSIS — M1990 Unspecified osteoarthritis, unspecified site: Secondary | ICD-10-CM

## 2021-09-10 DIAGNOSIS — N319 Neuromuscular dysfunction of bladder, unspecified: Secondary | ICD-10-CM

## 2021-09-10 DIAGNOSIS — R600 Localized edema: Secondary | ICD-10-CM

## 2021-09-10 DIAGNOSIS — C4491 Basal cell carcinoma of skin, unspecified: Secondary | ICD-10-CM

## 2021-09-10 DIAGNOSIS — A419 Sepsis, unspecified organism: Secondary | ICD-10-CM

## 2021-09-10 DIAGNOSIS — N400 Enlarged prostate without lower urinary tract symptoms: Secondary | ICD-10-CM

## 2021-09-10 DIAGNOSIS — S060XAA Concussion: Secondary | ICD-10-CM

## 2021-09-10 DIAGNOSIS — K760 Fatty (change of) liver, not elsewhere classified: Secondary | ICD-10-CM

## 2021-09-10 DIAGNOSIS — E7800 Pure hypercholesterolemia, unspecified: Secondary | ICD-10-CM

## 2021-09-10 DIAGNOSIS — N3001 Acute cystitis with hematuria: Secondary | ICD-10-CM

## 2021-09-10 DIAGNOSIS — N23 Unspecified renal colic: Secondary | ICD-10-CM

## 2021-09-10 DIAGNOSIS — Z8711 Personal history of peptic ulcer disease: Secondary | ICD-10-CM

## 2021-09-10 DIAGNOSIS — M720 Palmar fascial fibromatosis [Dupuytren]: Secondary | ICD-10-CM

## 2021-09-10 DIAGNOSIS — J449 Chronic obstructive pulmonary disease, unspecified: Secondary | ICD-10-CM

## 2021-09-10 DIAGNOSIS — I10 Essential (primary) hypertension: Secondary | ICD-10-CM

## 2021-09-10 DIAGNOSIS — R319 Hematuria, unspecified: Secondary | ICD-10-CM

## 2021-09-10 DIAGNOSIS — N312 Flaccid neuropathic bladder, not elsewhere classified: Secondary | ICD-10-CM

## 2021-09-10 LAB — URINALYSIS-MACROSCOPIC W/REFLEX MICROSCOPIC
BKR BILIRUBIN, UA: NEGATIVE
BKR BLOOD, UA: NEGATIVE
BKR GLUCOSE, UA: NEGATIVE
BKR KETONES, UA: NEGATIVE
BKR NITRITE, UA: NEGATIVE
BKR PH, UA: 8 — ABNORMAL HIGH (ref 5.5–7.5)
BKR SPECIFIC GRAVITY, UA: 1.017 /HPF — ABNORMAL HIGH (ref 1.005–1.030)
BKR UROBILINOGEN, UA (MG/DL): 4 mg/dL — ABNORMAL HIGH (ref <=2.0)

## 2021-09-10 LAB — URINE MICROSCOPIC     (BH GH LMW YH)
BKR RBC/HPF INSTRUMENT: 6 /HPF — ABNORMAL HIGH (ref 0–2)
BKR WBC/HPF INSTRUMENT: 40 /HPF — ABNORMAL HIGH (ref 0–5)

## 2021-09-10 MED ORDER — FOSFOMYCIN TROMETHAMINE 3 GRAM ORAL PACKET
3 gram | Freq: Once | ORAL | Status: DC
Start: 2021-09-10 — End: 2021-09-10

## 2021-09-10 MED ORDER — MOVANTIK 25 MG TABLET
25 mg | Status: AC
Start: 2021-09-10 — End: ?

## 2021-09-10 MED ORDER — FOSFOMYCIN TROMETHAMINE 3 GRAM ORAL PACKET
3 gram | Freq: Once | ORAL | Status: CP
Start: 2021-09-10 — End: ?
  Administered 2021-09-10: 16:00:00 3 gram via ORAL

## 2021-09-10 NOTE — Progress Notes
[name] came to the office today with complaint foul smelling urine. Pt denies fever/chills, nausea/vomiting and any other urinary complaints. Pt does CIC. Pt self-catheterized for urine sample which was sent for u/a and urine culture. Pt was given dose of Fosfomycin per Minda Ditto PA. Will await urine results. ED precautions discussed with pt for over the weekend and pt verbalized understanding.

## 2021-09-11 DIAGNOSIS — R600 Localized edema: Secondary | ICD-10-CM

## 2021-09-11 DIAGNOSIS — Z7901 Long term (current) use of anticoagulants: Secondary | ICD-10-CM

## 2021-09-11 DIAGNOSIS — N399 Disorder of urinary system, unspecified: Secondary | ICD-10-CM

## 2021-09-11 DIAGNOSIS — N3001 Acute cystitis with hematuria: Secondary | ICD-10-CM

## 2021-09-11 DIAGNOSIS — N312 Flaccid neuropathic bladder, not elsewhere classified: Secondary | ICD-10-CM

## 2021-09-12 LAB — URINE CULTURE
BKR URINE CULTURE, ROUTINE: >=100000 — AB
BKR UROBILINOGEN, UA: >=100000 EU/dL — AB (ref <=2.0)

## 2021-09-13 ENCOUNTER — Ambulatory Visit: Admit: 2021-09-13 | Payer: MEDICARE | Primary: Family Medicine

## 2021-09-13 ENCOUNTER — Ambulatory Visit: Admit: 2021-09-13 | Payer: MEDICARE | Attending: Medical Oncology | Primary: Family Medicine

## 2021-09-13 ENCOUNTER — Encounter: Admit: 2021-09-13 | Payer: PRIVATE HEALTH INSURANCE | Attending: Medical | Primary: Family Medicine

## 2021-09-13 MED ORDER — TADALAFIL 5 MG TABLET
5 mg | ORAL_TABLET | Freq: Every day | ORAL | 4 refills | Status: AC
Start: 2021-09-13 — End: 2022-09-30

## 2021-09-13 MED ORDER — CEPHALEXIN 500 MG CAPSULE
500 mg | ORAL_CAPSULE | Freq: Three times a day (TID) | ORAL | 1 refills | Status: AC
Start: 2021-09-13 — End: ?

## 2021-09-13 NOTE — Telephone Encounter
Reviewed with the pt recent + E. Coli sensitive to Cephalexin. Will treat with Cephalexin 500 mg TID x 5 days ( he also was given 1 dose of Fosfomycin on 10/14

## 2021-11-03 ENCOUNTER — Inpatient Hospital Stay: Admit: 2021-11-03 | Discharge: 2021-11-03 | Payer: MEDICARE | Primary: Family Medicine

## 2021-11-03 DIAGNOSIS — I2782 Chronic pulmonary embolism: Secondary | ICD-10-CM

## 2021-11-03 LAB — CBC WITH AUTO DIFFERENTIAL
BKR BASOPHILS: 0.2 % (ref 0.0–1.0)
BKR HEMATOCRIT: 48.1 % (ref 38.50–50.00)
BKR HEMOGLOBIN: 15.8 g/dL (ref 13.2–17.1)
BKR LYMPHOCYTES: 26 % — ABNORMAL HIGH (ref 17.0–50.0)
BKR MCH: 29.8 pg (ref 27.0–33.0)
BKR MCHC: 32.8 g/dL (ref 31.0–36.0)
BKR MCV: 90.8 fL (ref 80.0–100.0)
BKR MONOCYTES: 6.7 % — ABNORMAL HIGH (ref 4.0–12.0)
BKR MPV: 10 fL (ref 8.0–12.0)
BKR NEUTROPHILS: 60.6 % (ref 39.0–72.0)
BKR PLATELETS: 253 x 1000/ÂµL (ref 150–420)
BKR RDW-CV: 14.9 % (ref 11.0–15.0)
BKR WAM ANALYZER ANC: 4.94 x 1000/??L (ref 2.00–7.60)
BKR WAM RED BLOOD CELL COUNT.: 5.3 M/ÂµL (ref 4.00–6.00)

## 2021-11-03 LAB — COMPREHENSIVE METABOLIC PANEL
BKR A/G RATIO: 1
BKR ALANINE AMINOTRANSFERASE (ALT): 82 U/L — ABNORMAL HIGH (ref 12–78)
BKR ALBUMIN: 3.9 g/dL (ref 3.4–5.0)
BKR ALKALINE PHOSPHATASE: 171 U/L — ABNORMAL HIGH (ref 20–135)
BKR ANION GAP: 6 (ref 5–18)
BKR ASPARTATE AMINOTRANSFERASE (AST): 55 U/L — ABNORMAL HIGH (ref 5–37)
BKR AST/ALT RATIO: 0.7
BKR BILIRUBIN TOTAL: 0.4 mg/dL (ref 0.0–1.0)
BKR BLOOD UREA NITROGEN: 18 mg/dL (ref 8–25)
BKR BUN / CREAT RATIO: 19.4 (ref 8.0–25.0)
BKR CALCIUM: 9.6 mg/dL (ref 8.4–10.3)
BKR CHLORIDE: 104 mmol/L (ref 95–115)
BKR CO2: 28 mmol/L (ref 21–32)
BKR CREATININE: 0.93 mg/dL (ref 0.50–1.30)
BKR EGFR, CREATININE (CKD-EPI 2021): >60 mL/min/{1.73_m2} (ref >=60)
BKR EOSINOPHILS: 55 U/L — ABNORMAL HIGH (ref 5–37)
BKR GLOBULIN: 3.8 g/dL
BKR GLUCOSE: 100 mg/dL (ref 70–100)
BKR OSMOLALITY CALCULATION: 278 mOsm/kg (ref 275–295)
BKR POTASSIUM: 3.4 mmol/L — ABNORMAL LOW (ref 3.5–5.1)
BKR PROTEIN TOTAL: 7.7 g/dL (ref 6.4–8.2)
BKR SODIUM: 138 mmol/L (ref 136–145)
BKR WHITE BLOOD CELL COUNT: 104 mmol/L (ref 95–115)

## 2021-11-03 LAB — PARTIAL THROMBOPLASTIN TIME     (BH GH LMW Q YH): BKR PARTIAL THROMBOPLASTIN TIME: 28.8 seconds (ref 23.0–31.0)

## 2021-11-03 LAB — D-DIMER, QUANTITATIVE: BKR D-DIMER: 0.61 mg/L FEU — ABNORMAL HIGH (ref <0.49)

## 2021-11-05 LAB — SPECIAL COAGULATION MD INTERPRETATION (LAB ORDERABLE ONLY) (GH YH)

## 2021-11-05 LAB — FACTOR VIII ACTIVITY: BKR FACTOR VIII ACTIVITY: 163.5 % — ABNORMAL HIGH (ref 66.0–143.0)

## 2021-11-08 ENCOUNTER — Inpatient Hospital Stay: Admit: 2021-11-08 | Discharge: 2021-11-08 | Payer: MEDICARE | Primary: Family Medicine

## 2021-11-08 ENCOUNTER — Telehealth: Admit: 2021-11-08 | Payer: PRIVATE HEALTH INSURANCE | Attending: Medical | Primary: Family Medicine

## 2021-11-08 ENCOUNTER — Encounter: Admit: 2021-11-08 | Payer: PRIVATE HEALTH INSURANCE | Attending: Medical | Primary: Family Medicine

## 2021-11-08 DIAGNOSIS — N3001 Acute cystitis with hematuria: Secondary | ICD-10-CM

## 2021-11-08 DIAGNOSIS — N399 Disorder of urinary system, unspecified: Secondary | ICD-10-CM

## 2021-11-08 DIAGNOSIS — N312 Flaccid neuropathic bladder, not elsewhere classified: Secondary | ICD-10-CM

## 2021-11-08 LAB — URINALYSIS-MACROSCOPIC W/REFLEX MICROSCOPIC
BKR BILIRUBIN, UA: NEGATIVE
BKR BLOOD, UA: NEGATIVE
BKR GLUCOSE, UA: NEGATIVE
BKR KETONES, UA: NEGATIVE
BKR NITRITE, UA: NEGATIVE
BKR PH, UA: 6.5 (ref 5.5–7.5)
BKR PROTEIN, UA: NEGATIVE
BKR SPECIFIC GRAVITY, UA: 1.008 (ref 1.005–1.030)
BKR UROBILINOGEN, UA (MG/DL): <2 mg/dL (ref <=2.0)

## 2021-11-08 LAB — URINE MICROSCOPIC     (BH GH LMW YH)
BKR RBC/HPF INSTRUMENT: 1 /HPF (ref 0–2)
BKR WBC/HPF INSTRUMENT: 13 /HPF — ABNORMAL HIGH (ref 0–5)

## 2021-11-08 NOTE — Telephone Encounter
[name] came to the office today with complaints of foul odor in his urine and he also noticed some drops of blood in his urine yesterday. Pt denies fever/chills and nausea/vomiting. Pt does state that he has ocaisional burning and that his semen is black. Pt performs CIC 5-9 times per day. Pt gave a urine sample in the office today for u/a and urine culture. Pt was given an appointment in two days on 11/11/21 for follow up with Minda Ditto PA and ED precautions were discussed.

## 2021-11-10 LAB — URINE CULTURE: BKR URINE CULTURE, ROUTINE: >=100000 — AB

## 2021-11-11 ENCOUNTER — Encounter: Admit: 2021-11-11 | Payer: PRIVATE HEALTH INSURANCE | Attending: Family | Primary: Family Medicine

## 2021-11-11 ENCOUNTER — Ambulatory Visit: Admit: 2021-11-11 | Payer: MEDICARE | Attending: Medical | Primary: Family Medicine

## 2021-11-11 ENCOUNTER — Ambulatory Visit: Admit: 2021-11-11 | Payer: MEDICARE | Attending: Family | Primary: Family Medicine

## 2021-11-11 ENCOUNTER — Telehealth: Admit: 2021-11-11 | Payer: PRIVATE HEALTH INSURANCE | Attending: Medical | Primary: Family Medicine

## 2021-11-11 ENCOUNTER — Encounter: Admit: 2021-11-11 | Payer: PRIVATE HEALTH INSURANCE | Attending: Medical | Primary: Family Medicine

## 2021-11-11 DIAGNOSIS — I2782 Chronic pulmonary embolism: Secondary | ICD-10-CM

## 2021-11-11 DIAGNOSIS — J449 Chronic obstructive pulmonary disease, unspecified: Secondary | ICD-10-CM

## 2021-11-11 DIAGNOSIS — R300 Dysuria: Secondary | ICD-10-CM

## 2021-11-11 DIAGNOSIS — C4491 Basal cell carcinoma of skin, unspecified: Secondary | ICD-10-CM

## 2021-11-11 DIAGNOSIS — E7800 Pure hypercholesterolemia, unspecified: Secondary | ICD-10-CM

## 2021-11-11 DIAGNOSIS — G43909 Migraine, unspecified, not intractable, without status migrainosus: Secondary | ICD-10-CM

## 2021-11-11 DIAGNOSIS — Z5189 Encounter for other specified aftercare: Secondary | ICD-10-CM

## 2021-11-11 DIAGNOSIS — M720 Palmar fascial fibromatosis [Dupuytren]: Secondary | ICD-10-CM

## 2021-11-11 DIAGNOSIS — N23 Unspecified renal colic: Secondary | ICD-10-CM

## 2021-11-11 DIAGNOSIS — N312 Flaccid neuropathic bladder, not elsewhere classified: Secondary | ICD-10-CM

## 2021-11-11 DIAGNOSIS — N400 Enlarged prostate without lower urinary tract symptoms: Secondary | ICD-10-CM

## 2021-11-11 DIAGNOSIS — M1990 Unspecified osteoarthritis, unspecified site: Secondary | ICD-10-CM

## 2021-11-11 DIAGNOSIS — I10 Essential (primary) hypertension: Secondary | ICD-10-CM

## 2021-11-11 DIAGNOSIS — K219 Gastro-esophageal reflux disease without esophagitis: Secondary | ICD-10-CM

## 2021-11-11 DIAGNOSIS — S060XAA Concussion: Secondary | ICD-10-CM

## 2021-11-11 DIAGNOSIS — Z8711 Personal history of peptic ulcer disease: Secondary | ICD-10-CM

## 2021-11-11 DIAGNOSIS — R092 Respiratory arrest: Secondary | ICD-10-CM

## 2021-11-11 DIAGNOSIS — K760 Fatty (change of) liver, not elsewhere classified: Secondary | ICD-10-CM

## 2021-11-11 DIAGNOSIS — N390 Urinary tract infection, site not specified: Secondary | ICD-10-CM

## 2021-11-11 DIAGNOSIS — R319 Hematuria, unspecified: Secondary | ICD-10-CM

## 2021-11-11 DIAGNOSIS — R3989 Other symptoms and signs involving the genitourinary system: Secondary | ICD-10-CM

## 2021-11-11 DIAGNOSIS — N319 Neuromuscular dysfunction of bladder, unspecified: Secondary | ICD-10-CM

## 2021-11-11 DIAGNOSIS — A419 Sepsis, unspecified organism: Secondary | ICD-10-CM

## 2021-11-11 MED ORDER — CEPHALEXIN 500 MG CAPSULE
500 mg | ORAL_CAPSULE | Freq: Three times a day (TID) | ORAL | 1 refills | Status: AC
Start: 2021-11-11 — End: ?

## 2021-11-11 NOTE — Telephone Encounter
LM. (+) UCx with E. Coli - will start Cephalexin 500 mg TID x 10 days. No need for appointment today
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
## 2021-11-24 ENCOUNTER — Encounter: Admit: 2021-11-24 | Payer: PRIVATE HEALTH INSURANCE | Attending: Medical | Primary: Family Medicine

## 2021-11-24 ENCOUNTER — Ambulatory Visit: Admit: 2021-11-24 | Payer: MEDICARE | Attending: Medical | Primary: Family Medicine

## 2021-11-24 DIAGNOSIS — Z5189 Encounter for other specified aftercare: Secondary | ICD-10-CM

## 2021-11-24 DIAGNOSIS — M1990 Unspecified osteoarthritis, unspecified site: Secondary | ICD-10-CM

## 2021-11-24 DIAGNOSIS — S060XAA Concussion: Secondary | ICD-10-CM

## 2021-11-24 DIAGNOSIS — R3989 Other symptoms and signs involving the genitourinary system: Secondary | ICD-10-CM

## 2021-11-24 DIAGNOSIS — G43909 Migraine, unspecified, not intractable, without status migrainosus: Secondary | ICD-10-CM

## 2021-11-24 DIAGNOSIS — J449 Chronic obstructive pulmonary disease, unspecified: Secondary | ICD-10-CM

## 2021-11-24 DIAGNOSIS — N390 Urinary tract infection, site not specified: Secondary | ICD-10-CM

## 2021-11-24 DIAGNOSIS — N319 Neuromuscular dysfunction of bladder, unspecified: Secondary | ICD-10-CM

## 2021-11-24 DIAGNOSIS — C4491 Basal cell carcinoma of skin, unspecified: Secondary | ICD-10-CM

## 2021-11-24 DIAGNOSIS — N312 Flaccid neuropathic bladder, not elsewhere classified: Secondary | ICD-10-CM

## 2021-11-24 DIAGNOSIS — K760 Fatty (change of) liver, not elsewhere classified: Secondary | ICD-10-CM

## 2021-11-24 DIAGNOSIS — N400 Enlarged prostate without lower urinary tract symptoms: Secondary | ICD-10-CM

## 2021-11-24 DIAGNOSIS — I10 Essential (primary) hypertension: Secondary | ICD-10-CM

## 2021-11-24 DIAGNOSIS — R092 Respiratory arrest: Secondary | ICD-10-CM

## 2021-11-24 DIAGNOSIS — Z8711 Personal history of peptic ulcer disease: Secondary | ICD-10-CM

## 2021-11-24 DIAGNOSIS — M720 Palmar fascial fibromatosis [Dupuytren]: Secondary | ICD-10-CM

## 2021-11-24 DIAGNOSIS — E7800 Pure hypercholesterolemia, unspecified: Secondary | ICD-10-CM

## 2021-11-24 DIAGNOSIS — N23 Unspecified renal colic: Secondary | ICD-10-CM

## 2021-11-24 DIAGNOSIS — R319 Hematuria, unspecified: Secondary | ICD-10-CM

## 2021-11-24 DIAGNOSIS — A419 Sepsis, unspecified organism: Secondary | ICD-10-CM

## 2021-11-24 DIAGNOSIS — K219 Gastro-esophageal reflux disease without esophagitis: Secondary | ICD-10-CM

## 2021-11-24 LAB — URINALYSIS-MACROSCOPIC W/REFLEX MICROSCOPIC
BKR BILIRUBIN, UA: NEGATIVE
BKR BLOOD, UA: NEGATIVE /HPF — AB
BKR GLUCOSE, UA: NEGATIVE /HPF
BKR KETONES, UA: NEGATIVE
BKR NITRITE, UA: NEGATIVE
BKR PH, UA: 5 — ABNORMAL LOW (ref 5.5–7.5)
BKR PROTEIN, UA: NEGATIVE
BKR SPECIFIC GRAVITY, UA: 1.022 /HPF — ABNORMAL HIGH (ref 1.005–1.030)
BKR UROBILINOGEN, UA (MG/DL): <2 mg/dL (ref <=2.0)

## 2021-11-24 LAB — URINE MICROSCOPIC     (BH GH LMW YH)
BKR HYALINE CASTS, UA INSTRUMENT (NUMERIC): 1 /LPF (ref 0–3)
BKR RBC/HPF INSTRUMENT: 6 /HPF — ABNORMAL HIGH (ref 0–2)
BKR URINE SQUAMOUS EPITHELIAL CELLS, UA (NUMERIC): 1 /HPF (ref 0–5)
BKR WBC/HPF INSTRUMENT: 13 /HPF — ABNORMAL HIGH (ref 0–5)

## 2021-11-24 MED ORDER — TRULICITY 0.75 MG/0.5 ML SUBCUTANEOUS PEN INJECTOR
0.75 mg/0.5 mL | Status: AC
Start: 2021-11-24 — End: 2022-02-25

## 2021-11-24 NOTE — Progress Notes
Patient is here today to submit [facility] urine sample for UA and UCx. He completed his course of abx and is here for post treatment test. Patient has no complaints at this time and feels well. Urine sample obtained and sent to lab. Patient aware we will call him with results.

## 2021-11-25 DIAGNOSIS — Z87440 Personal history of urinary (tract) infections: Secondary | ICD-10-CM

## 2021-11-26 ENCOUNTER — Telehealth: Admit: 2021-11-26 | Payer: PRIVATE HEALTH INSURANCE | Attending: Medical | Primary: Family Medicine

## 2021-11-26 LAB — URINE CULTURE: BKR URINE CULTURE, ROUTINE: >=100000 — AB

## 2021-11-26 MED ORDER — SULFAMETHOXAZOLE 800 MG-TRIMETHOPRIM 160 MG TABLET
800-160 mg | ORAL_TABLET | Freq: Two times a day (BID) | ORAL | 1 refills | Status: AC
Start: 2021-11-26 — End: 2021-12-28

## 2021-11-26 MED ORDER — SULFAMETHOXAZOLE 800 MG-TRIMETHOPRIM 160 MG TABLET
800-160 mg | ORAL_TABLET | Freq: Two times a day (BID) | ORAL | 1 refills | Status: AC
Start: 2021-11-26 — End: 2021-11-26

## 2021-11-26 NOTE — Telephone Encounter
Patient at [facility] and pharmacy stated they never received prescription.Advised of fax confirmationSpoke to pharmacist who stated the original prescription was sent to [facility] who filled prescription and now insurance will not cover another one.Pharmacist offering to give patient prescription that he will have to pay $9 for.Patient agreed

## 2021-11-26 NOTE — Telephone Encounter
Another E. Coli (+) UCx - will start Bactrim DS BID x 7 days as Cephalexin has not been effective. Will consider suppression antibiotics as well after acute UTI Tx. Will hold k-Lor for x 7 days.

## 2021-12-26 ENCOUNTER — Encounter: Admit: 2021-12-26 | Payer: PRIVATE HEALTH INSURANCE | Attending: Hospitalist | Primary: Family Medicine

## 2021-12-26 ENCOUNTER — Emergency Department: Admit: 2021-12-26 | Payer: PRIVATE HEALTH INSURANCE | Primary: Family Medicine

## 2021-12-26 ENCOUNTER — Inpatient Hospital Stay
Admit: 2021-12-26 | Discharge: 2021-12-27 | Payer: PRIVATE HEALTH INSURANCE | Attending: Emergency Medicine | Primary: Family Medicine

## 2021-12-26 DIAGNOSIS — C4491 Basal cell carcinoma of skin, unspecified: Secondary | ICD-10-CM

## 2021-12-26 DIAGNOSIS — Z8711 Personal history of peptic ulcer disease: Secondary | ICD-10-CM

## 2021-12-26 DIAGNOSIS — Z5189 Encounter for other specified aftercare: Secondary | ICD-10-CM

## 2021-12-26 DIAGNOSIS — K760 Fatty (change of) liver, not elsewhere classified: Secondary | ICD-10-CM

## 2021-12-26 DIAGNOSIS — I10 Essential (primary) hypertension: Secondary | ICD-10-CM

## 2021-12-26 DIAGNOSIS — S060XAA Concussion: Secondary | ICD-10-CM

## 2021-12-26 DIAGNOSIS — T17228A Food in pharynx causing other injury, initial encounter: Secondary | ICD-10-CM

## 2021-12-26 DIAGNOSIS — E7800 Pure hypercholesterolemia, unspecified: Secondary | ICD-10-CM

## 2021-12-26 DIAGNOSIS — M1990 Unspecified osteoarthritis, unspecified site: Secondary | ICD-10-CM

## 2021-12-26 DIAGNOSIS — K219 Gastro-esophageal reflux disease without esophagitis: Secondary | ICD-10-CM

## 2021-12-26 DIAGNOSIS — N390 Urinary tract infection, site not specified: Secondary | ICD-10-CM

## 2021-12-26 DIAGNOSIS — N319 Neuromuscular dysfunction of bladder, unspecified: Secondary | ICD-10-CM

## 2021-12-26 DIAGNOSIS — R092 Respiratory arrest: Secondary | ICD-10-CM

## 2021-12-26 DIAGNOSIS — M720 Palmar fascial fibromatosis [Dupuytren]: Secondary | ICD-10-CM

## 2021-12-26 DIAGNOSIS — R319 Hematuria, unspecified: Secondary | ICD-10-CM

## 2021-12-26 DIAGNOSIS — J449 Chronic obstructive pulmonary disease, unspecified: Secondary | ICD-10-CM

## 2021-12-26 DIAGNOSIS — R3989 Other symptoms and signs involving the genitourinary system: Secondary | ICD-10-CM

## 2021-12-26 DIAGNOSIS — G43909 Migraine, unspecified, not intractable, without status migrainosus: Secondary | ICD-10-CM

## 2021-12-26 DIAGNOSIS — N400 Enlarged prostate without lower urinary tract symptoms: Secondary | ICD-10-CM

## 2021-12-26 DIAGNOSIS — A419 Sepsis, unspecified organism: Secondary | ICD-10-CM

## 2021-12-26 DIAGNOSIS — N23 Unspecified renal colic: Secondary | ICD-10-CM

## 2021-12-26 MED ORDER — PANTOPRAZOLE IV PUSH 40 MG VIAL & NS (ADULTS)
Freq: Once | INTRAVENOUS | Status: DC
Start: 2021-12-26 — End: 2021-12-27

## 2021-12-26 MED ORDER — POTASSIUM CHLORIDE ER 20 MEQ TABLET,EXTENDED RELEASE(PART/CRYST)
20 MEQ | Freq: Once | ORAL | Status: CP
Start: 2021-12-26 — End: ?
  Administered 2021-12-27: 02:00:00 20 MEQ via ORAL

## 2021-12-26 MED ORDER — POTASSIUM CHLORIDE 20 MEQ/100ML IN STERILE WATER INTRAVENOUS PIGGYBACK
20100 mEq/100 mL | INTRAVENOUS | Status: CP
Start: 2021-12-26 — End: ?
  Administered 2021-12-27 (×3): 20 mL/h via INTRAVENOUS

## 2021-12-27 DIAGNOSIS — Z7951 Long term (current) use of inhaled steroids: Secondary | ICD-10-CM

## 2021-12-27 DIAGNOSIS — E876 Hypokalemia: Secondary | ICD-10-CM

## 2021-12-27 DIAGNOSIS — Z87898 Personal history of other specified conditions: Secondary | ICD-10-CM

## 2021-12-27 DIAGNOSIS — E7800 Pure hypercholesterolemia, unspecified: Secondary | ICD-10-CM

## 2021-12-27 DIAGNOSIS — I10 Essential (primary) hypertension: Secondary | ICD-10-CM

## 2021-12-27 DIAGNOSIS — J449 Chronic obstructive pulmonary disease, unspecified: Secondary | ICD-10-CM

## 2021-12-27 DIAGNOSIS — Z85828 Personal history of other malignant neoplasm of skin: Secondary | ICD-10-CM

## 2021-12-27 DIAGNOSIS — Z7985 Long-term (current) use of injectable non-insulin antidiabetic drugs: Secondary | ICD-10-CM

## 2021-12-27 DIAGNOSIS — Z7901 Long term (current) use of anticoagulants: Secondary | ICD-10-CM

## 2021-12-27 DIAGNOSIS — Z888 Allergy status to other drugs, medicaments and biological substances status: Secondary | ICD-10-CM

## 2021-12-27 DIAGNOSIS — M1990 Unspecified osteoarthritis, unspecified site: Secondary | ICD-10-CM

## 2021-12-27 DIAGNOSIS — K219 Gastro-esophageal reflux disease without esophagitis: Secondary | ICD-10-CM

## 2021-12-27 DIAGNOSIS — G43909 Migraine, unspecified, not intractable, without status migrainosus: Secondary | ICD-10-CM

## 2021-12-27 DIAGNOSIS — Z885 Allergy status to narcotic agent status: Secondary | ICD-10-CM

## 2021-12-27 DIAGNOSIS — N400 Enlarged prostate without lower urinary tract symptoms: Secondary | ICD-10-CM

## 2021-12-27 DIAGNOSIS — Z79899 Other long term (current) drug therapy: Secondary | ICD-10-CM

## 2021-12-27 DIAGNOSIS — Z20822 Contact with and (suspected) exposure to covid-19: Secondary | ICD-10-CM

## 2021-12-27 LAB — CBC WITH AUTO DIFFERENTIAL
BKR WAM ABSOLUTE IMMATURE GRANULOCYTES.: 0.02 x 1000/ÂµL (ref 0.00–0.30)
BKR WAM ABSOLUTE LYMPHOCYTE COUNT.: 1.85 x 1000/ÂµL (ref 0.60–3.70)
BKR WAM ABSOLUTE NRBC (2 DEC): 0 x 1000/ÂµL (ref 0.00–1.00)
BKR WAM ANALYZER ANC: 4.15 x 1000/ÂµL (ref 2.00–7.60)
BKR WAM BASOPHIL ABSOLUTE COUNT.: 0.04 x 1000/ÂµL (ref 0.00–1.00)
BKR WAM BASOPHILS: 0.6 % (ref 0.0–1.4)
BKR WAM EOSINOPHIL ABSOLUTE COUNT.: 0.61 x 1000/ÂµL (ref 0.00–1.00)
BKR WAM EOSINOPHILS: 8.6 % — ABNORMAL HIGH (ref 0.0–5.0)
BKR WAM HEMATOCRIT (2 DEC): 44.2 % (ref 38.50–50.00)
BKR WAM HEMOGLOBIN: 14.7 g/dL (ref 13.2–17.1)
BKR WAM IMMATURE GRANULOCYTES: 0.3 % (ref 0.0–1.0)
BKR WAM LYMPHOCYTES: 25.9 % (ref 17.0–50.0)
BKR WAM MCH (PG): 30.1 pg (ref 27.0–33.0)
BKR WAM MCHC: 33.3 g/dL (ref 31.0–36.0)
BKR WAM MCV: 90.4 fL (ref 80.0–100.0)
BKR WAM MONOCYTE ABSOLUTE COUNT.: 0.46 x 1000/ÂµL (ref 0.00–1.00)
BKR WAM MONOCYTES: 6.5 % (ref 4.0–12.0)
BKR WAM MPV: 10.2 fL (ref 8.0–12.0)
BKR WAM NEUTROPHILS: 58.1 % (ref 39.0–72.0)
BKR WAM NUCLEATED RED BLOOD CELLS: 0 % (ref 0.0–1.0)
BKR WAM PLATELETS: 219 x1000/ÂµL (ref 150–420)
BKR WAM RDW-CV: 13.8 % (ref 11.0–15.0)
BKR WAM RED BLOOD CELL COUNT.: 4.89 M/ÂµL (ref 4.00–6.00)
BKR WAM WHITE BLOOD CELL COUNT: 7.1 x1000/ÂµL (ref 4.0–11.0)

## 2021-12-27 LAB — SARS COV-2 (COVID-19) RNA: BKR SARS-COV-2 RNA (COVID-19) (YH): NEGATIVE

## 2021-12-27 LAB — BASIC METABOLIC PANEL
BKR ANION GAP: 9 (ref 5–18)
BKR BLOOD UREA NITROGEN: 13 mg/dL (ref 8–25)
BKR BUN / CREAT RATIO: 15.1 (ref 8.0–25.0)
BKR CALCIUM: 8.5 mg/dL (ref 8.4–10.3)
BKR CHLORIDE: 107 mmol/L (ref 95–115)
BKR CO2: 28 mmol/L (ref 21–32)
BKR CREATININE: 0.86 mg/dL (ref 0.50–1.30)
BKR EGFR, CREATININE (CKD-EPI 2021): >60 mL/min/{1.73_m2} (ref >=60)
BKR GLUCOSE: 111 mg/dL — ABNORMAL HIGH (ref 70–100)
BKR OSMOLALITY CALCULATION: 288 mosm/kg (ref 275–295)
BKR POTASSIUM: 3.1 mmol/L — ABNORMAL LOW (ref 3.5–5.1)
BKR SODIUM: 144 mmol/L (ref 136–145)

## 2021-12-27 LAB — COMPREHENSIVE METABOLIC PANEL
BKR A/G RATIO: 1
BKR ALANINE AMINOTRANSFERASE (ALT): 71 U/L (ref 12–78)
BKR ALBUMIN: 3.4 g/dL (ref 3.4–5.0)
BKR ALKALINE PHOSPHATASE: 150 U/L — ABNORMAL HIGH (ref 20–135)
BKR ANION GAP: 10 (ref 5–18)
BKR ASPARTATE AMINOTRANSFERASE (AST): 60 U/L — ABNORMAL HIGH (ref 5–37)
BKR AST/ALT RATIO: 0.8
BKR BILIRUBIN TOTAL: 0.4 mg/dL (ref 0.0–1.0)
BKR BLOOD UREA NITROGEN: 15 mg/dL (ref 8–25)
BKR BUN / CREAT RATIO: 18.8 (ref 8.0–25.0)
BKR CALCIUM: 8.9 mg/dL (ref 8.4–10.3)
BKR CHLORIDE: 103 mmol/L (ref 95–115)
BKR CO2: 27 mmol/L (ref 21–32)
BKR CREATININE: 0.8 mg/dL (ref 0.50–1.30)
BKR EGFR, CREATININE (CKD-EPI 2021): >60 mL/min/{1.73_m2} (ref >=60)
BKR GLOBULIN: 3.5 g/dL
BKR GLUCOSE: 124 mg/dL — ABNORMAL HIGH (ref 70–100)
BKR OSMOLALITY CALCULATION: 282 mosm/kg (ref 275–295)
BKR POTASSIUM: 2.8 mmol/L — ABNORMAL LOW (ref 3.5–5.1)
BKR PROTEIN TOTAL: 6.9 g/dL (ref 6.4–8.2)
BKR SODIUM: 140 mmol/L (ref 136–145)

## 2021-12-27 LAB — MAGNESIUM: BKR MAGNESIUM: 2 mg/dL (ref 1.4–2.2)
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
## 2021-12-27 NOTE — Discharge Instructions
Return to ER for any worse or concerning symptomsFollow up with Dr Nedra Hai tomorrow for your low potassium level.You are signing out of the hospital against medical advice, you can return at any time for further evaluation and managment

## 2021-12-28 ENCOUNTER — Encounter: Admit: 2021-12-28 | Payer: PRIVATE HEALTH INSURANCE | Attending: Adult Health | Primary: Family Medicine

## 2021-12-28 ENCOUNTER — Inpatient Hospital Stay: Admit: 2021-12-28 | Discharge: 2021-12-28 | Payer: PRIVATE HEALTH INSURANCE | Primary: Family Medicine

## 2021-12-28 ENCOUNTER — Ambulatory Visit: Admit: 2021-12-28 | Payer: PRIVATE HEALTH INSURANCE | Attending: Adult Health | Primary: Family Medicine

## 2021-12-28 ENCOUNTER — Ambulatory Visit: Admit: 2021-12-28 | Payer: PRIVATE HEALTH INSURANCE | Attending: Medical Oncology | Primary: Family Medicine

## 2021-12-28 DIAGNOSIS — M720 Palmar fascial fibromatosis [Dupuytren]: Secondary | ICD-10-CM

## 2021-12-28 DIAGNOSIS — N23 Unspecified renal colic: Secondary | ICD-10-CM

## 2021-12-28 DIAGNOSIS — S060XAA Concussion: Secondary | ICD-10-CM

## 2021-12-28 DIAGNOSIS — A419 Sepsis, unspecified organism: Secondary | ICD-10-CM

## 2021-12-28 DIAGNOSIS — Z8711 Personal history of peptic ulcer disease: Secondary | ICD-10-CM

## 2021-12-28 DIAGNOSIS — C4491 Basal cell carcinoma of skin, unspecified: Secondary | ICD-10-CM

## 2021-12-28 DIAGNOSIS — G43909 Migraine, unspecified, not intractable, without status migrainosus: Secondary | ICD-10-CM

## 2021-12-28 DIAGNOSIS — I10 Essential (primary) hypertension: Secondary | ICD-10-CM

## 2021-12-28 DIAGNOSIS — N390 Urinary tract infection, site not specified: Secondary | ICD-10-CM

## 2021-12-28 DIAGNOSIS — K760 Fatty (change of) liver, not elsewhere classified: Secondary | ICD-10-CM

## 2021-12-28 DIAGNOSIS — N319 Neuromuscular dysfunction of bladder, unspecified: Secondary | ICD-10-CM

## 2021-12-28 DIAGNOSIS — R319 Hematuria, unspecified: Secondary | ICD-10-CM

## 2021-12-28 DIAGNOSIS — I2782 Chronic pulmonary embolism: Secondary | ICD-10-CM

## 2021-12-28 DIAGNOSIS — K219 Gastro-esophageal reflux disease without esophagitis: Secondary | ICD-10-CM

## 2021-12-28 DIAGNOSIS — E876 Hypokalemia: Secondary | ICD-10-CM

## 2021-12-28 DIAGNOSIS — E7800 Pure hypercholesterolemia, unspecified: Secondary | ICD-10-CM

## 2021-12-28 DIAGNOSIS — N400 Enlarged prostate without lower urinary tract symptoms: Secondary | ICD-10-CM

## 2021-12-28 DIAGNOSIS — M1990 Unspecified osteoarthritis, unspecified site: Secondary | ICD-10-CM

## 2021-12-28 DIAGNOSIS — R3989 Other symptoms and signs involving the genitourinary system: Secondary | ICD-10-CM

## 2021-12-28 DIAGNOSIS — J449 Chronic obstructive pulmonary disease, unspecified: Secondary | ICD-10-CM

## 2021-12-28 DIAGNOSIS — R092 Respiratory arrest: Secondary | ICD-10-CM

## 2021-12-28 DIAGNOSIS — Z5189 Encounter for other specified aftercare: Secondary | ICD-10-CM

## 2021-12-28 LAB — CBC WITH AUTO DIFFERENTIAL
BKR BASOPHILS: 0.3 % (ref 0.0–1.0)
BKR CREATININE: 10 fL (ref 8.0–12.0)
BKR EOSINOPHILS: 8.6 % — ABNORMAL HIGH (ref 0.0–5.0)
BKR HEMATOCRIT: 44.7 % (ref 38.50–50.00)
BKR LYMPHOCYTES: 26.3 % (ref 17.0–50.0)
BKR MCH: 29.9 pg (ref 27.0–33.0)
BKR MCHC: 32 g/dL (ref 31.0–36.0)
BKR MCV: 93.5 fL (ref 80.0–100.0)
BKR MONOCYTES: 7.1 % (ref 4.0–12.0)
BKR MPV: 10 fL (ref 8.0–12.0)
BKR NEUTROPHILS: 57.7 % (ref 39.0–72.0)
BKR PLATELETS: 220 x 1000/??L (ref 150–420)
BKR RDW-CV: 14.3 % (ref 11.0–15.0)
BKR WAM ANALYZER ANC: 3.42 x 1000/ÂµL (ref 2.00–7.60)
BKR WAM RED BLOOD CELL COUNT.: 4.78 M/??L (ref 4.00–6.00)
BKR WHITE BLOOD CELL COUNT: 5.9 x1000/??L (ref 4.0–11.0)

## 2021-12-28 LAB — COMPREHENSIVE METABOLIC PANEL
BKR A/G RATIO: 0.9 — ABNORMAL LOW (ref 1.0–2.2)
BKR ALANINE AMINOTRANSFERASE (ALT): 59 U/L — ABNORMAL HIGH (ref 10–47)
BKR ALBUMIN: 3.2 g/dL — ABNORMAL LOW (ref 3.3–5.5)
BKR ALKALINE PHOSPHATASE: 129 U/L — ABNORMAL HIGH (ref 53–128)
BKR ANION GAP: 8 g/dL (ref ?–4)
BKR ASPARTATE AMINOTRANSFERASE (AST): 59 U/L — ABNORMAL HIGH (ref 11–38)
BKR AST/ALT RATIO: 1
BKR BILIRUBIN TOTAL: 0.5 mg/dL (ref 0.2–1.6)
BKR BLOOD UREA NITROGEN: 12 mg/dL (ref 7–22)
BKR BUN / CREAT RATIO: 13.3 % (ref 10.0–20.0)
BKR CALCIUM: 9.3 mg/dL (ref 8.0–10.3)
BKR CHLORIDE: 108 mmol/L (ref 98–108)
BKR CO2: 29 mmol/L (ref 18–33)
BKR EGFR, CREATININE (CKD-EPI 2021): >60 mL/min/{1.73_m2} (ref >=60)
BKR GLOBULIN: 3.5 g/dL (ref 2.3–3.5)
BKR GLUCOSE: 126 mg/dL — ABNORMAL HIGH (ref 73–118)
BKR HEMOGLOBIN: 145 mmol/L (ref 128–145)
BKR POTASSIUM: 3.5 mmol/L — ABNORMAL LOW (ref 3.6–5.1)
BKR PROTEIN TOTAL: 6.7 g/dL (ref 6.4–8.1)
BKR SODIUM: 145 mmol/L (ref 128–145)

## 2021-12-28 LAB — MAGNESIUM: BKR MAGNESIUM: 2.2 mg/dL (ref 1.4–2.2)

## 2021-12-28 LAB — PARTIAL THROMBOPLASTIN TIME     (BH GH LMW Q YH): BKR PARTIAL THROMBOPLASTIN TIME: 28.5 seconds (ref 23.0–31.0)

## 2021-12-28 LAB — D-DIMER, QUANTITATIVE: BKR D-DIMER: 0.94 mg/L FEU — ABNORMAL HIGH (ref 8.0–<0.49)

## 2021-12-28 MED ORDER — POTASSIUM CHLORIDE ER 10 MEQ TABLET,EXTENDED RELEASE
10 MEQ | Freq: Every day | ORAL | Status: AC
Start: 2021-12-28 — End: ?

## 2021-12-28 MED ORDER — PREGABALIN 150 MG CAPSULE
150 mg | Status: AC
Start: 2021-12-28 — End: ?

## 2021-12-28 NOTE — Telephone Encounter
Spoke with [name] in follow up to ED visit.Says he is doing fine and has an appointment with Dr.Lee later today.
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
## 2021-12-30 LAB — SPECIAL COAGULATION MD INTERPRETATION (LAB ORDERABLE ONLY) (GH YH)

## 2021-12-30 LAB — FACTOR VIII ACTIVITY: BKR FACTOR VIII ACTIVITY: 166 % — ABNORMAL HIGH (ref 66.0–143.0)

## 2022-01-04 ENCOUNTER — Telehealth: Admit: 2022-01-04 | Payer: PRIVATE HEALTH INSURANCE | Attending: Medical | Primary: Family Medicine

## 2022-01-04 ENCOUNTER — Encounter: Admit: 2022-01-04 | Payer: PRIVATE HEALTH INSURANCE | Attending: Medical | Primary: Family Medicine

## 2022-01-04 NOTE — Telephone Encounter
A urology order form for CIC catheters 14-Fr silicone straight x 7 per day 3210 was faxed to [facility] as per the pt request

## 2022-01-06 ENCOUNTER — Telehealth: Admit: 2022-01-06 | Payer: PRIVATE HEALTH INSURANCE | Attending: Medical | Primary: Family Medicine

## 2022-01-06 NOTE — Telephone Encounter
[facility] MessageContact Person Name:   [facility] Relationship to Patient:  n/aBest Phone # to Call: [phone] ext [phone] #: [phone] reason for call:   [facility] faxed a request for chart notes include electronic signatureCarla [facility] Specialist
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
## 2022-01-29 ENCOUNTER — Encounter: Admit: 2022-01-29 | Payer: PRIVATE HEALTH INSURANCE | Attending: Medical Oncology | Primary: Family Medicine

## 2022-01-31 MED ORDER — ELIQUIS 5 MG TABLET
5 mg | ORAL_TABLET | 6 refills | Status: AC
Start: 2022-01-31 — End: 2022-05-30

## 2022-02-03 ENCOUNTER — Telehealth: Admit: 2022-02-03 | Payer: PRIVATE HEALTH INSURANCE | Attending: Medical | Primary: Family Medicine

## 2022-02-03 NOTE — Telephone Encounter
[facility] MESSAGETime of call:   5:22 PMCaller:  [employer] relationship to patient:  n/a  Calling from [employer], hospital, agency, etc.):  [facility] Reason for call:   [facility] calling to follow up on new script sent over for patients cath supplies. Form not received at this time. Provided both fax numbers, as it appears we have not received the form yet. They are requesting that once received the form gets filled out and singed by provider and faxed back.If not feeling well, what are symptoms:  n/a   If having symptoms, how long have the symptoms been present:  n/a   Does caller request to speak to someone urgently?  no   If yes, warm transferred to:  n/aProvider:  Physician Assistant (PA) L. BilenkinLast clinic visit date:  12/28/22Best telephone number for callback:   Call: [phone] ext [phone] Fax #: [phone] time to return call:   anyPermission to leave message:  yes   [facility]

## 2022-02-04 NOTE — Telephone Encounter
Please get [place] paperwork

## 2022-02-04 NOTE — Telephone Encounter
Paperwork faxed again. Was faxed 01-04-22.

## 2022-02-10 ENCOUNTER — Ambulatory Visit: Admit: 2022-02-10 | Payer: MEDICARE | Attending: Medical Oncology | Primary: Family Medicine

## 2022-02-25 ENCOUNTER — Encounter: Admit: 2022-02-25 | Payer: PRIVATE HEALTH INSURANCE | Attending: Medical Oncology | Primary: Family Medicine

## 2022-02-25 ENCOUNTER — Inpatient Hospital Stay: Admit: 2022-02-25 | Discharge: 2022-02-25 | Payer: PRIVATE HEALTH INSURANCE | Primary: Family Medicine

## 2022-02-25 ENCOUNTER — Ambulatory Visit: Admit: 2022-02-25 | Payer: PRIVATE HEALTH INSURANCE | Attending: Medical Oncology | Primary: Family Medicine

## 2022-02-25 DIAGNOSIS — Z5189 Encounter for other specified aftercare: Secondary | ICD-10-CM

## 2022-02-25 DIAGNOSIS — N390 Urinary tract infection, site not specified: Secondary | ICD-10-CM

## 2022-02-25 DIAGNOSIS — Z7901 Long term (current) use of anticoagulants: Secondary | ICD-10-CM

## 2022-02-25 DIAGNOSIS — G43909 Migraine, unspecified, not intractable, without status migrainosus: Secondary | ICD-10-CM

## 2022-02-25 DIAGNOSIS — K760 Fatty (change of) liver, not elsewhere classified: Secondary | ICD-10-CM

## 2022-02-25 DIAGNOSIS — J449 Chronic obstructive pulmonary disease, unspecified: Secondary | ICD-10-CM

## 2022-02-25 DIAGNOSIS — N23 Unspecified renal colic: Secondary | ICD-10-CM

## 2022-02-25 DIAGNOSIS — K219 Gastro-esophageal reflux disease without esophagitis: Secondary | ICD-10-CM

## 2022-02-25 DIAGNOSIS — R092 Respiratory arrest: Secondary | ICD-10-CM

## 2022-02-25 DIAGNOSIS — S060XAA Concussion: Secondary | ICD-10-CM

## 2022-02-25 DIAGNOSIS — A419 Sepsis, unspecified organism: Secondary | ICD-10-CM

## 2022-02-25 DIAGNOSIS — R319 Hematuria, unspecified: Secondary | ICD-10-CM

## 2022-02-25 DIAGNOSIS — I8291 Chronic embolism and thrombosis of unspecified vein: Secondary | ICD-10-CM

## 2022-02-25 DIAGNOSIS — R7989 Other specified abnormal findings of blood chemistry: Secondary | ICD-10-CM

## 2022-02-25 DIAGNOSIS — N400 Enlarged prostate without lower urinary tract symptoms: Secondary | ICD-10-CM

## 2022-02-25 DIAGNOSIS — Z8711 Personal history of peptic ulcer disease: Secondary | ICD-10-CM

## 2022-02-25 DIAGNOSIS — I2782 Chronic pulmonary embolism: Secondary | ICD-10-CM

## 2022-02-25 DIAGNOSIS — M720 Palmar fascial fibromatosis [Dupuytren]: Secondary | ICD-10-CM

## 2022-02-25 DIAGNOSIS — R3989 Other symptoms and signs involving the genitourinary system: Secondary | ICD-10-CM

## 2022-02-25 DIAGNOSIS — C4491 Basal cell carcinoma of skin, unspecified: Secondary | ICD-10-CM

## 2022-02-25 DIAGNOSIS — I10 Essential (primary) hypertension: Secondary | ICD-10-CM

## 2022-02-25 DIAGNOSIS — E7800 Pure hypercholesterolemia, unspecified: Secondary | ICD-10-CM

## 2022-02-25 DIAGNOSIS — M1990 Unspecified osteoarthritis, unspecified site: Secondary | ICD-10-CM

## 2022-02-25 DIAGNOSIS — N319 Neuromuscular dysfunction of bladder, unspecified: Secondary | ICD-10-CM

## 2022-02-25 LAB — COMPREHENSIVE METABOLIC PANEL
BKR A/G RATIO: 1.1
BKR ALANINE AMINOTRANSFERASE (ALT): 91 U/L — ABNORMAL HIGH (ref 12–78)
BKR ALBUMIN: 3.7 g/dL (ref 3.4–5.0)
BKR ALKALINE PHOSPHATASE: 159 U/L — ABNORMAL HIGH (ref 20–135)
BKR ANION GAP: 4 g/dL — ABNORMAL LOW (ref 5–18)
BKR ASPARTATE AMINOTRANSFERASE (AST): 48 U/L — ABNORMAL HIGH (ref 5–37)
BKR AST/ALT RATIO: 0.5
BKR BILIRUBIN TOTAL: 0.3 mg/dL (ref 0.0–1.0)
BKR BLOOD UREA NITROGEN: 14 mg/dL (ref 8–25)
BKR BUN / CREAT RATIO: 15.9 % (ref 8.0–25.0)
BKR CALCIUM: 9.3 mg/dL (ref 8.8–10.2)
BKR CHLORIDE: 110 mmol/L (ref 95–115)
BKR CO2: 28 mmol/L (ref 21–32)
BKR CREATININE: 0.88 mg/dL (ref 0.50–1.30)
BKR EGFR, CREATININE (CKD-EPI 2021): >60 mL/min/{1.73_m2} (ref >=60)
BKR GLOBULIN: 3.4 g/dL
BKR GLUCOSE: 109 mg/dL — ABNORMAL HIGH (ref 70–100)
BKR HEMATOCRIT: 4.1 mmol/L (ref 3.5–5.1)
BKR MPV: 0.88 mg/dL (ref 0.50–1.30)
BKR OSMOLALITY CALCULATION: 284 mOsm/kg (ref 275–295)
BKR POTASSIUM: 4.1 mmol/L (ref 3.5–5.1)
BKR PROTEIN TOTAL: 7.1 g/dL (ref 6.4–8.2)
BKR SODIUM: 142 mmol/L (ref 136–145)

## 2022-02-25 LAB — CBC WITH AUTO DIFFERENTIAL
BKR BASOPHILS: 0.2 % (ref 0.0–1.0)
BKR EOSINOPHILS: 7.3 % — ABNORMAL HIGH (ref 0.0–5.0)
BKR HEMOGLOBIN: 15 g/dL (ref 13.2–17.1)
BKR LYMPHOCYTES: 20.2 % (ref 17.0–50.0)
BKR MCH: 30.1 pg (ref 27.0–33.0)
BKR MCHC: 33.1 g/dL (ref 31.0–36.0)
BKR MCV: 91 fL (ref 80.0–100.0)
BKR MONOCYTES: 5 % (ref 4.0–12.0)
BKR NEUTROPHILS: 67.3 % (ref 39.0–72.0)
BKR PARTIAL THROMBOPLASTIN TIME: 30.1 pg (ref 27.0–33.0)
BKR PLATELETS: 244 x 1000/??L (ref 150–420)
BKR RDW-CV: 14.2 % — ABNORMAL HIGH (ref 11.0–15.0)
BKR WAM ANALYZER ANC: 5.77 x 1000/??L (ref 2.00–7.60)
BKR WAM RED BLOOD CELL COUNT.: 4.98 M/??L (ref 4.00–6.00)
BKR WHITE BLOOD CELL COUNT: 8.6 x1000/??L (ref 4.0–11.0)

## 2022-02-25 LAB — D-DIMER, QUANTITATIVE: BKR D-DIMER: 1.11 mg{FEU}/L — ABNORMAL HIGH (ref <0.49)

## 2022-02-25 MED ORDER — TIRZEPATIDE 7.5 MG/0.5 ML SUBCUTANEOUS PEN INJECTOR
7.5 mg/0.5 mL | SUBCUTANEOUS | Status: AC
Start: 2022-02-25 — End: ?
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
## 2022-02-28 LAB — FACTOR VIII ACTIVITY: BKR FACTOR VIII ACTIVITY: 113.9 % (ref 66.0–143.0)

## 2022-03-01 LAB — SPECIAL COAGULATION MD INTERPRETATION (LAB ORDERABLE ONLY) (GH YH)

## 2022-05-30 ENCOUNTER — Encounter: Admit: 2022-05-30 | Payer: PRIVATE HEALTH INSURANCE | Primary: Family Medicine

## 2022-05-30 DIAGNOSIS — I8291 Chronic embolism and thrombosis of unspecified vein: Secondary | ICD-10-CM

## 2022-05-30 DIAGNOSIS — Z7901 Long term (current) use of anticoagulants: Secondary | ICD-10-CM

## 2022-05-30 DIAGNOSIS — I2782 Chronic pulmonary embolism: Secondary | ICD-10-CM

## 2022-05-30 MED ORDER — APIXABAN 5 MG TABLET
5 mg | ORAL_TABLET | Freq: Two times a day (BID) | ORAL | 6 refills | Status: AC
Start: 2022-05-30 — End: 2022-10-09

## 2022-05-30 NOTE — Telephone Encounter
Refill sent to new pharmacy:  [facility] delivery [facility].  Verified request with pt who confirmed.  Eliquis 5 mg tab BID sent today.

## 2022-07-28 ENCOUNTER — Inpatient Hospital Stay: Admit: 2022-07-28 | Discharge: 2022-07-28 | Payer: PRIVATE HEALTH INSURANCE | Primary: Family Medicine

## 2022-07-28 ENCOUNTER — Ambulatory Visit: Admit: 2022-07-28 | Payer: PRIVATE HEALTH INSURANCE | Attending: Medical Oncology | Primary: Family Medicine

## 2022-07-28 DIAGNOSIS — I2782 Chronic pulmonary embolism: Secondary | ICD-10-CM

## 2022-07-28 DIAGNOSIS — Z7901 Long term (current) use of anticoagulants: Secondary | ICD-10-CM

## 2022-07-28 DIAGNOSIS — I8291 Chronic embolism and thrombosis of unspecified vein: Secondary | ICD-10-CM

## 2022-07-28 DIAGNOSIS — R7989 Other specified abnormal findings of blood chemistry: Secondary | ICD-10-CM

## 2022-07-28 LAB — COMPREHENSIVE METABOLIC PANEL
BKR A/G RATIO: 0.8 mL/min/{1.73_m2} (ref >=60)
BKR ALANINE AMINOTRANSFERASE (ALT): 27 U/L (ref 12–78)
BKR ALBUMIN: 3.4 g/dL (ref 3.4–5.0)
BKR ALKALINE PHOSPHATASE: 160 U/L — ABNORMAL HIGH (ref 20–135)
BKR ANION GAP: 6 (ref 5–18)
BKR ASPARTATE AMINOTRANSFERASE (AST): 22 U/L (ref 5–37)
BKR AST/ALT RATIO: 0.8
BKR BILIRUBIN TOTAL: 0.3 mg/dL (ref 0.0–1.0)
BKR BILIRUBIN TOTAL: 7.5 g/dL (ref 6.4–8.2)
BKR BLOOD UREA NITROGEN: 11 mg/dL (ref 8–25)
BKR BUN / CREAT RATIO: 12.1 (ref 8.0–25.0)
BKR CALCIUM: 9.3 mg/dL (ref 8.8–10.2)
BKR CHLORIDE: 108 mmol/L (ref 95–115)
BKR CO2: 24 mmol/L (ref 21–32)
BKR EGFR, CREATININE (CKD-EPI 2021): >60 mL/min/{1.73_m2} (ref >=60)
BKR GLOBULIN: 4.1 g/dL
BKR GLUCOSE: 105 mg/dL — ABNORMAL HIGH (ref 70–100)
BKR OSMOLALITY CALCULATION: 275 mOsm/kg (ref 275–295)
BKR POTASSIUM: 3.8 mmol/L (ref 3.5–5.1)
BKR PROTEIN TOTAL: 7.5 g/dL (ref 6.4–8.2)
BKR SODIUM: 138 mmol/L (ref 136–145)
BKR WAM BASOPHILS: 11 mg/dL (ref 8–25)
BKR WAM EOSINOPHIL ABSOLUTE COUNT.: 0.3 mg/dL (ref 0.0–1.0)

## 2022-07-28 LAB — CBC WITH AUTO DIFFERENTIAL
BKR BASOPHILS: 0.3 % (ref 0.0–1.0)
BKR EOSINOPHILS: 8.6 % — ABNORMAL HIGH (ref 0.0–5.0)
BKR HEMATOCRIT: 42.6 % (ref 38.50–50.00)
BKR HEMOGLOBIN: 13.8 g/dL (ref 13.2–17.1)
BKR LYMPHOCYTES: 22 % (ref 17.0–50.0)
BKR MCH: 29.6 pg (ref 27.0–33.0)
BKR MCHC: 32.4 g/dL (ref 31.0–36.0)
BKR MCV: 91.2 fL (ref 80.0–100.0)
BKR MONOCYTES: 6.9 % (ref 4.0–12.0)
BKR MPV: 10.3 fL (ref 8.0–12.0)
BKR NEUTROPHILS: 62.2 % (ref 39.0–72.0)
BKR PLATELETS: 211 x 1000/??L (ref 150–420)
BKR RDW-CV: 14.2 % (ref 11.0–15.0)
BKR WAM ANALYZER ANC: 3.89 x 1000/??L (ref 2.00–7.60)
BKR WAM RED BLOOD CELL COUNT.: 4.67 M/??L (ref 4.00–6.00)
BKR WHITE BLOOD CELL COUNT: 6.3 x1000/??L (ref 4.0–11.0)

## 2022-07-28 LAB — PARTIAL THROMBOPLASTIN TIME     (BH GH LMW Q YH): BKR PARTIAL THROMBOPLASTIN TIME: 29.7 seconds (ref 23.0–31.0)

## 2022-07-28 LAB — D-DIMER, QUANTITATIVE: BKR D-DIMER: 0.9 mg/L FEU — ABNORMAL HIGH (ref <0.49)
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
## 2022-07-29 LAB — FACTOR VIII ACTIVITY: BKR FACTOR VIII ACTIVITY: 153.2 % — ABNORMAL HIGH (ref 66.0–143.0)

## 2022-08-01 LAB — SPECIAL COAGULATION MD INTERPRETATION (LAB ORDERABLE ONLY) (GH YH)

## 2022-08-24 ENCOUNTER — Emergency Department: Admit: 2022-08-24 | Payer: PRIVATE HEALTH INSURANCE | Primary: Family Medicine

## 2022-08-24 ENCOUNTER — Inpatient Hospital Stay
Admit: 2022-08-24 | Discharge: 2022-08-25 | Payer: PRIVATE HEALTH INSURANCE | Attending: Hospital Medicine | Admitting: Hospital Medicine

## 2022-08-24 ENCOUNTER — Emergency Department: Admit: 2022-08-24 | Payer: PRIVATE HEALTH INSURANCE | Attending: Anesthesiology | Primary: Family Medicine

## 2022-08-24 DIAGNOSIS — T18128A Food in esophagus causing other injury, initial encounter: Secondary | ICD-10-CM

## 2022-08-24 MED ORDER — LIDOCAINE (PF) 20 MG/ML (2 %) INJECTION SOLUTION
202 mg/mL (2 %) | INTRAVENOUS | Status: DC | PRN
Start: 2022-08-24 — End: 2022-08-25
  Administered 2022-08-25: 03:00:00 20 mg/mL (2 %) via INTRAVENOUS

## 2022-08-24 MED ORDER — PHENYLEPHRINE FOR ANESTHESIA
INTRAVENOUS | Status: DC | PRN
Start: 2022-08-24 — End: 2022-08-25
  Administered 2022-08-25: 04:00:00 250.000 mL/h via INTRAVENOUS

## 2022-08-24 MED ORDER — SUCCINYLCHOLINE CHLORIDE 20 MG/ML INJECTION SOLUTION
20 mg/mL | INTRAVENOUS | Status: DC | PRN
Start: 2022-08-24 — End: 2022-08-25
  Administered 2022-08-25: 03:00:00 20 mg/mL via INTRAVENOUS

## 2022-08-24 MED ORDER — FENTANYL (PF) 50 MCG/ML INJECTION SOLUTION
50 mcg/mL | Status: CP
Start: 2022-08-24 — End: ?

## 2022-08-24 MED ORDER — LACTATED RINGERS INTRAVENOUS SOLUTION
INTRAVENOUS | Status: DC | PRN
Start: 2022-08-24 — End: 2022-08-25
  Administered 2022-08-25: 03:00:00 via INTRAVENOUS

## 2022-08-24 MED ORDER — FENTANYL (PF) 50 MCG/ML INJECTION SOLUTION
50 mcg/mL | INTRAVENOUS | Status: DC | PRN
Start: 2022-08-24 — End: 2022-08-25
  Administered 2022-08-25: 03:00:00 50 mcg/mL via INTRAVENOUS

## 2022-08-24 MED ORDER — PHENYLEPHRINE 1 MG/10 ML (100 MCG/ML) IN 0.9 % SOD.CHLORIDE IV SYRINGE
110100 mg/0 mL (00 mcg/mL) | INTRAVENOUS | Status: DC | PRN
Start: 2022-08-24 — End: 2022-08-25
  Administered 2022-08-25 (×5): 1 mg/0 mL (00 mcg/mL) via INTRAVENOUS

## 2022-08-24 MED ORDER — ONDANSETRON HCL (PF) 4 MG/2 ML INJECTION SOLUTION
42 mg/2 mL | Freq: Once | INTRAVENOUS | Status: CP
Start: 2022-08-24 — End: ?
  Administered 2022-08-25: 01:00:00 4 mL via INTRAVENOUS

## 2022-08-24 MED ORDER — PROPOFOL 10 MG/ML INTRAVENOUS EMULSION
10 mg/mL | INTRAVENOUS | Status: DC | PRN
Start: 2022-08-24 — End: 2022-08-25
  Administered 2022-08-25: 03:00:00 10 mg/mL via INTRAVENOUS

## 2022-08-24 MED ORDER — SUGAMMADEX 100 MG/ML INTRAVENOUS SOLUTION
100 mg/mL | Status: CP
Start: 2022-08-24 — End: ?

## 2022-08-24 MED ORDER — PHENYLEPHRINE 40 MG/250 ML (160 MCG/ML) IN 0.9 % SODIUM CHLORIDE IV
40250160 mg/250 mL (160 mcg/mL) | Status: CP
Start: 2022-08-24 — End: ?

## 2022-08-24 MED ORDER — GLUCAGON 1 MG/ML IN STERILE WATER
Freq: Once | INTRAVENOUS | Status: CP
Start: 2022-08-24 — End: ?
  Administered 2022-08-25: 01:00:00 2.000 mL via INTRAVENOUS

## 2022-08-24 MED ORDER — ROCURONIUM 10 MG/ML INTRAVENOUS SOLUTION
10 mg/mL | INTRAVENOUS | Status: DC | PRN
Start: 2022-08-24 — End: 2022-08-25
  Administered 2022-08-25 (×3): 10 mg/mL via INTRAVENOUS

## 2022-08-25 ENCOUNTER — Encounter: Admit: 2022-08-25 | Payer: PRIVATE HEALTH INSURANCE | Primary: Family Medicine

## 2022-08-25 DIAGNOSIS — G4733 Obstructive sleep apnea (adult) (pediatric): Secondary | ICD-10-CM

## 2022-08-25 DIAGNOSIS — N400 Enlarged prostate without lower urinary tract symptoms: Secondary | ICD-10-CM

## 2022-08-25 DIAGNOSIS — Z79891 Long term (current) use of opiate analgesic: Secondary | ICD-10-CM

## 2022-08-25 DIAGNOSIS — R1310 Dysphagia, unspecified: Secondary | ICD-10-CM

## 2022-08-25 DIAGNOSIS — Z7985 Long-term (current) use of injectable non-insulin antidiabetic drugs: Secondary | ICD-10-CM

## 2022-08-25 DIAGNOSIS — Z79899 Other long term (current) drug therapy: Secondary | ICD-10-CM

## 2022-08-25 DIAGNOSIS — Z85828 Personal history of other malignant neoplasm of skin: Secondary | ICD-10-CM

## 2022-08-25 DIAGNOSIS — K760 Fatty (change of) liver, not elsewhere classified: Secondary | ICD-10-CM

## 2022-08-25 DIAGNOSIS — N319 Neuromuscular dysfunction of bladder, unspecified: Secondary | ICD-10-CM

## 2022-08-25 DIAGNOSIS — G629 Polyneuropathy, unspecified: Secondary | ICD-10-CM

## 2022-08-25 DIAGNOSIS — Z8711 Personal history of peptic ulcer disease: Secondary | ICD-10-CM

## 2022-08-25 DIAGNOSIS — Z539 Procedure and treatment not carried out, unspecified reason: Secondary | ICD-10-CM

## 2022-08-25 DIAGNOSIS — K222 Esophageal obstruction: Secondary | ICD-10-CM

## 2022-08-25 DIAGNOSIS — I2782 Chronic pulmonary embolism: Secondary | ICD-10-CM

## 2022-08-25 DIAGNOSIS — Z888 Allergy status to other drugs, medicaments and biological substances status: Secondary | ICD-10-CM

## 2022-08-25 DIAGNOSIS — M1990 Unspecified osteoarthritis, unspecified site: Secondary | ICD-10-CM

## 2022-08-25 DIAGNOSIS — G43909 Migraine, unspecified, not intractable, without status migrainosus: Secondary | ICD-10-CM

## 2022-08-25 DIAGNOSIS — Z7901 Long term (current) use of anticoagulants: Secondary | ICD-10-CM

## 2022-08-25 DIAGNOSIS — E669 Obesity, unspecified: Secondary | ICD-10-CM

## 2022-08-25 DIAGNOSIS — E7800 Pure hypercholesterolemia, unspecified: Secondary | ICD-10-CM

## 2022-08-25 DIAGNOSIS — F419 Anxiety disorder, unspecified: Secondary | ICD-10-CM

## 2022-08-25 DIAGNOSIS — G8929 Other chronic pain: Secondary | ICD-10-CM

## 2022-08-25 DIAGNOSIS — J449 Chronic obstructive pulmonary disease, unspecified: Secondary | ICD-10-CM

## 2022-08-25 DIAGNOSIS — Z6835 Body mass index (BMI) 35.0-35.9, adult: Secondary | ICD-10-CM

## 2022-08-25 DIAGNOSIS — I10 Essential (primary) hypertension: Secondary | ICD-10-CM

## 2022-08-25 DIAGNOSIS — Z885 Allergy status to narcotic agent status: Secondary | ICD-10-CM

## 2022-08-25 DIAGNOSIS — K219 Gastro-esophageal reflux disease without esophagitis: Secondary | ICD-10-CM

## 2022-08-25 DIAGNOSIS — Z87891 Personal history of nicotine dependence: Secondary | ICD-10-CM

## 2022-08-25 LAB — CBC WITH AUTO DIFFERENTIAL
BKR WAM ABSOLUTE IMMATURE GRANULOCYTES.: 0.03 x 1000/ÂµL (ref 0.00–0.30)
BKR WAM ABSOLUTE IMMATURE GRANULOCYTES.: 0.04 x 1000/ÂµL (ref 0.00–0.30)
BKR WAM ABSOLUTE LYMPHOCYTE COUNT.: 2.04 x 1000/ÂµL (ref 0.60–3.70)
BKR WAM ABSOLUTE LYMPHOCYTE COUNT.: 2.24 x 1000/ÂµL (ref 0.60–3.70)
BKR WAM ABSOLUTE NRBC (2 DEC): 0 x 1000/ÂµL (ref 0.00–1.00)
BKR WAM ABSOLUTE NRBC (2 DEC): 0 x 1000/ÂµL (ref 0.00–1.00)
BKR WAM ANALYZER ANC: 6.5 x 1000/ÂµL (ref 2.00–7.60)
BKR WAM ANALYZER ANC: 6.77 x 1000/ÂµL (ref 2.00–7.60)
BKR WAM BASOPHIL ABSOLUTE COUNT.: 0.04 x 1000/ÂµL (ref 0.00–1.00)
BKR WAM BASOPHIL ABSOLUTE COUNT.: 0.05 x 1000/ÂµL (ref 0.00–1.00)
BKR WAM BASOPHILS: 0.4 % (ref 0.0–1.4)
BKR WAM BASOPHILS: 0.5 % (ref 0.0–1.4)
BKR WAM EOSINOPHIL ABSOLUTE COUNT.: 0.67 x 1000/ÂµL (ref 0.00–1.00)
BKR WAM EOSINOPHIL ABSOLUTE COUNT.: 0.73 x 1000/ÂµL (ref 0.00–1.00)
BKR WAM EOSINOPHILS: 6.7 % — ABNORMAL HIGH (ref 0.0–5.0)
BKR WAM EOSINOPHILS: 7.2 % — ABNORMAL HIGH (ref 0.0–5.0)
BKR WAM HEMATOCRIT (2 DEC): 45.6 % (ref 38.50–50.00)
BKR WAM HEMATOCRIT (2 DEC): 45.6 % (ref 38.50–50.00)
BKR WAM HEMOGLOBIN: 14.5 g/dL (ref 13.2–17.1)
BKR WAM HEMOGLOBIN: 14.5 g/dL (ref 13.2–17.1)
BKR WAM IMMATURE GRANULOCYTES: 0.3 % (ref 0.0–1.0)
BKR WAM IMMATURE GRANULOCYTES: 0.4 % (ref 0.0–1.0)
BKR WAM LYMPHOCYTES: 20.4 % (ref 17.0–50.0)
BKR WAM LYMPHOCYTES: 22.2 % (ref 17.0–50.0)
BKR WAM MCH (PG): 28.3 pg (ref 27.0–33.0)
BKR WAM MCH (PG): 28.8 pg (ref 27.0–33.0)
BKR WAM MCHC: 31.8 g/dL (ref 31.0–36.0)
BKR WAM MCHC: 31.8 g/dL (ref 31.0–36.0)
BKR WAM MCV: 88.9 fL (ref 80.0–100.0)
BKR WAM MCV: 90.5 fL (ref 80.0–100.0)
BKR WAM MONOCYTE ABSOLUTE COUNT.: 0.44 x 1000/ÂµL (ref 0.00–1.00)
BKR WAM MONOCYTE ABSOLUTE COUNT.: 0.54 x 1000/ÂµL (ref 0.00–1.00)
BKR WAM MONOCYTES: 4.4 % (ref 4.0–12.0)
BKR WAM MONOCYTES: 5.4 % (ref 4.0–12.0)
BKR WAM MPV: 10.4 fL (ref 8.0–12.0)
BKR WAM MPV: 10.5 fL (ref 8.0–12.0)
BKR WAM NEUTROPHILS: 64.4 % (ref 39.0–72.0)
BKR WAM NEUTROPHILS: 67.7 % (ref 39.0–72.0)
BKR WAM NUCLEATED RED BLOOD CELLS: 0 % (ref 0.0–1.0)
BKR WAM NUCLEATED RED BLOOD CELLS: 0 % (ref 0.0–1.0)
BKR WAM PLATELETS: 232 x1000/ÂµL (ref 150–420)
BKR WAM PLATELETS: 282 x1000/ÂµL (ref 150–420)
BKR WAM RDW-CV: 14.1 % (ref 11.0–15.0)
BKR WAM RDW-CV: 14.4 % (ref 11.0–15.0)
BKR WAM RED BLOOD CELL COUNT.: 5.04 M/ÂµL (ref 4.00–6.00)
BKR WAM RED BLOOD CELL COUNT.: 5.13 M/ÂµL (ref 4.00–6.00)
BKR WAM WHITE BLOOD CELL COUNT: 10 x1000/ÂµL (ref 4.0–11.0)
BKR WAM WHITE BLOOD CELL COUNT: 10.1 x1000/ÂµL (ref 4.0–11.0)

## 2022-08-25 LAB — BASIC METABOLIC PANEL
BKR ANION GAP: 12 (ref 7–17)
BKR ANION GAP: 10 (ref 7–17)
BKR BLOOD UREA NITROGEN: 11 mg/dL (ref 8–23)
BKR BLOOD UREA NITROGEN: 14 mg/dL (ref 8–23)
BKR BUN / CREAT RATIO: 10.9 (ref 8.0–23.0)
BKR BUN / CREAT RATIO: 12.6 (ref 8.0–23.0)
BKR CALCIUM: 9.4 mg/dL (ref 8.8–10.2)
BKR CALCIUM: 9.3 mg/dL (ref 8.8–10.2)
BKR CHLORIDE: 109 mmol/L — ABNORMAL HIGH (ref 98–107)
BKR CHLORIDE: 106 mmol/L (ref 98–107)
BKR CO2: 20 mmol/L (ref 20–30)
BKR CO2: 24 mmol/L (ref 20–30)
BKR CREATININE: 1.01 mg/dL (ref 0.40–1.30)
BKR CREATININE: 1.11 mg/dL (ref 0.40–1.30)
BKR EGFR, CREATININE (CKD-EPI 2021): >60 mL/min/{1.73_m2} (ref >=60)
BKR EGFR, CREATININE (CKD-EPI 2021): >60 mL/min/{1.73_m2} (ref >=60)
BKR GLUCOSE: 87 mg/dL (ref 70–100)
BKR GLUCOSE: 79 mg/dL (ref 70–100)
BKR POTASSIUM: 3.8 mmol/L (ref 3.3–5.3)
BKR POTASSIUM: 3.9 mmol/L (ref 3.3–5.3)
BKR SODIUM: 141 mmol/L (ref 136–144)
BKR SODIUM: 140 mmol/L (ref 136–144)

## 2022-08-25 LAB — PHOSPHORUS     (BH GH L LMW YH): BKR PHOSPHORUS: 4.3 mg/dL (ref 2.2–4.5)

## 2022-08-25 LAB — PT/INR AND PTT (BH GH L LMW YH)
BKR INR: 1.04 (ref 0.86–1.12)
BKR PARTIAL THROMBOPLASTIN TIME: 30.8 s (ref 23.0–31.4)
BKR PROTHROMBIN TIME: 11.5 s (ref 9.6–12.3)

## 2022-08-25 LAB — MAGNESIUM: BKR MAGNESIUM: 2.1 mg/dL (ref 1.7–2.4)

## 2022-08-25 MED ORDER — PHENOL 1.4 % MUCOSAL AEROSOL SPRAY
1.4 % | OROMUCOSAL | Status: DC | PRN
Start: 2022-08-25 — End: 2022-08-26

## 2022-08-25 MED ORDER — TOPIRAMATE 25 MG TABLET
25 mg | Freq: Every evening | ORAL | Status: DC
Start: 2022-08-25 — End: 2022-08-26

## 2022-08-25 MED ORDER — PREGABALIN 75 MG CAPSULE
75 mg | Freq: Two times a day (BID) | ORAL | Status: DC
Start: 2022-08-25 — End: 2022-08-26
  Administered 2022-08-25: 13:00:00 75 mg via ORAL

## 2022-08-25 MED ORDER — PANTOPRAZOLE 40 MG TABLET,DELAYED RELEASE
40 mg | Freq: Two times a day (BID) | ORAL | Status: DC
Start: 2022-08-25 — End: 2022-08-26
  Administered 2022-08-25: 13:00:00 40 mg via ORAL

## 2022-08-25 MED ORDER — HYDROMORPHONE 1 MG/ML INJECTION SYRINGE
1 mg/mL | INTRAVENOUS | Status: DC | PRN
Start: 2022-08-25 — End: 2022-08-25

## 2022-08-25 MED ORDER — ONDANSETRON HCL (PF) 4 MG/2 ML INJECTION SOLUTION
4 mg/2 mL | INTRAVENOUS | Status: DC | PRN
Start: 2022-08-25 — End: 2022-08-25
  Administered 2022-08-25: 05:00:00 4 mg/2 mL via INTRAVENOUS

## 2022-08-25 MED ORDER — METOCLOPRAMIDE 5 MG/ML INJECTION SOLUTION
5 mg/mL | Freq: Four times a day (QID) | INTRAVENOUS | Status: DC | PRN
Start: 2022-08-25 — End: 2022-08-26

## 2022-08-25 MED ORDER — SENNOSIDES 8.6 MG TABLET
8.6 mg | Freq: Every evening | ORAL | Status: DC
Start: 2022-08-25 — End: 2022-08-26

## 2022-08-25 MED ORDER — DIPHENHYDRAMINE 50 MG/ML INJECTION (WRAPPED E-RX)
50 mg/mL | INTRAVENOUS | Status: DC | PRN
Start: 2022-08-25 — End: 2022-08-25
  Administered 2022-08-25: 05:00:00 50 mg/mL via INTRAVENOUS

## 2022-08-25 MED ORDER — HYDROMORPHONE 2 MG TABLET
2 mg | Freq: Four times a day (QID) | ORAL | Status: DC | PRN
Start: 2022-08-25 — End: 2022-08-26
  Administered 2022-08-25: 15:00:00 2 mg via ORAL

## 2022-08-25 MED ORDER — ACETAMINOPHEN 325 MG TABLET
325 mg | Freq: Four times a day (QID) | ORAL | Status: DC | PRN
Start: 2022-08-25 — End: 2022-08-26

## 2022-08-25 MED ORDER — ALPRAZOLAM 0.5 MG TABLET
0.5 mg | Freq: Two times a day (BID) | ORAL | Status: DC | PRN
Start: 2022-08-25 — End: 2022-08-26
  Administered 2022-08-25: 08:00:00 0.5 mg via ORAL

## 2022-08-25 MED ORDER — HYDROMORPHONE 0.5 MG/0.5 ML INJECTION SYRINGE
0.5 mg/ mL | INTRAVENOUS | Status: DC | PRN
Start: 2022-08-25 — End: 2022-08-25

## 2022-08-25 MED ORDER — SODIUM CHLORIDE 0.9 % (FLUSH) INJECTION SYRINGE
0.9 % | INTRAVENOUS | Status: DC | PRN
Start: 2022-08-25 — End: 2022-08-26

## 2022-08-25 MED ORDER — POLYETHYLENE GLYCOL 3350 17 GRAM ORAL POWDER PACKET
17 gram | Freq: Every day | ORAL | Status: DC | PRN
Start: 2022-08-25 — End: 2022-08-26

## 2022-08-25 MED ORDER — SUGAMMADEX 100 MG/ML INTRAVENOUS SOLUTION
100 mg/mL | INTRAVENOUS | Status: DC | PRN
Start: 2022-08-25 — End: 2022-08-25
  Administered 2022-08-25: 05:00:00 100 mg/mL via INTRAVENOUS

## 2022-08-25 MED ORDER — DOCUSATE SODIUM 100 MG CAPSULE
100 mg | Freq: Every day | ORAL | Status: AC
Start: 2022-08-25 — End: ?

## 2022-08-25 MED ORDER — ROSUVASTATIN 40 MG TABLET
40 mg | Freq: Every day | ORAL | Status: DC
Start: 2022-08-25 — End: 2022-08-26
  Administered 2022-08-25: 13:00:00 40 mg via ORAL

## 2022-08-25 MED ORDER — FLU VACCINE QS2023-24(65YR UP)(PF)240 MCG/0.7 ML INTRAMUSCULAR SYRINGE
240 mcg/0.7 mL | INTRAMUSCULAR | Status: CP
Start: 2022-08-25 — End: ?
  Administered 2022-08-25: 13:00:00 240 mL via INTRAMUSCULAR

## 2022-08-25 MED ORDER — MULTIVITAMIN ORAL
Freq: Every day | ORAL | Status: AC
Start: 2022-08-25 — End: ?

## 2022-08-25 MED ORDER — LACTATED RINGERS INTRAVENOUS SOLUTION
INTRAVENOUS | Status: DC
Start: 2022-08-25 — End: 2022-08-25
  Administered 2022-08-25: 07:00:00 1000.000 mL/h via INTRAVENOUS

## 2022-08-25 MED ORDER — APIXABAN 2.5 MG TABLET
2.5 mg | Freq: Two times a day (BID) | ORAL | Status: DC
Start: 2022-08-25 — End: 2022-08-26
  Administered 2022-08-25: 13:00:00 2.5 mg via ORAL

## 2022-08-25 MED ORDER — PANTOPRAZOLE 40 MG TABLET,DELAYED RELEASE
40 mg | Freq: Every day | ORAL | Status: DC
Start: 2022-08-25 — End: 2022-08-25

## 2022-08-25 MED ORDER — SODIUM CHLORIDE 0.9 % (FLUSH) INJECTION SYRINGE
0.9 % | Freq: Three times a day (TID) | INTRAVENOUS | Status: DC
Start: 2022-08-25 — End: 2022-08-26

## 2022-08-25 MED ORDER — INCRUSE ELLIPTA 62.5 MCG/ACTUATION POWDER FOR INHALATION
62.5 mcg/actuation | Status: AC
Start: 2022-08-25 — End: ?

## 2022-08-25 NOTE — Anesthesia Post-Procedure Evaluation
Anesthesia Post-op NotePatient: Henry Riggs J HelgesonProcedure(s):  Procedure(s) (LRB):UPPER GI ENDOSCOPY (N/A) Last Vitals:  I have reviewed the post-operative vital signs.POSTOP EVALUATION:      Patient Recovery Location:  PACU     Vital Signs Status:  Stable     Patient Participation:  Patient participated     Mental Status:  Awake     Respiratory Status:  Acceptable     Airway Patency:  Patent     Cardiovascular/Hydration Status:  Stable     Pain Management:  Satisfactory to patient     Nausea/Vomiting Status:  Satisfactory to patientThere were no known notable events for this encounter.

## 2022-08-25 NOTE — Plan of Care
Problem: Adult Inpatient Plan of CareGoal: Plan of Care ReviewOutcome: Interventions implemented as appropriate Plan of Care Overview/ Patient Status    0700-1600 A/O x4. C/o chronic back pain. Continues with Dilaudid 4mg  PO Q6H PRN. Pt has own supply of dilaudid in safe. Confirmed with off going RN that medications are in safe. Diet upgraded to dental soft diet. 20G right , flushed without difficulty, site C/D/I. Skin intact. Pt self s/c; yellow urine noted. Independent OOB with steady gait noted. Flu vaccine given to left deltoid. Esophogram ordered. Call bell within reach. Safety rounding continues. 1530 Pt stating he is leaving. No matter what anyone says he is going to go. APP and MD notified. PIV removed. AMA formed signed. Awaiting brother to pick up.1630 MD discharged patient. Pt to follow up outpatient. Brother picking up patient in [employer]. Wheel chair to lobby provided.
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
## 2022-08-25 NOTE — Progress Notes
[facility] Gastroenterology Progress NoteAttending Provider: Link Snuffer, MD [phone] History: - EGD performed on 09/27 remarkable for food bolus and total obstruction in the upper esophageal sphincter which required prolonged piece meal retrieval.  Suspected narrowing/stricture at 15 cm from the incisors- Patient complains of chest pain after the procedure and oropharyngeal sorenessObjective: Vitals:Temp:  [97.1 ?F (36.2 ?C)-97.7 ?F (36.5 ?C)] 97.6 ?F (36.4 ?C)Pulse:  [80-91] 91Resp:  [14-18] 16BP: (107-132)/(66-80) 117/67SpO2:  [93 %-97 %] 96 %Device (Oxygen Therapy): nasal cannulaO2 Flow (L/min):  [1-3] 1Physical Exam:GENERAL: Awake, cooperative, in no acute distress. EYES: No conjunctival iterus, no conjunctival pallorENT: Moist mucous membranes, no oropharyngeal lesions, neck symmetric and suppleCARDIOVASCULAR: Regular rate and rhythm, no murmurs, no lower extremity edemaRESPIRATORY: No respiratory distress on room air, clear to auscultation bilaterally GASTROINTESTINAL: Bowel sounds present, soft, nondistended, nontender without guarding or rebound, no hepatosplenomegalySKIN: Warm and dry. No jaundice, no skin lesions on visualized skin NEURO/PSYCH: Alert and oriented to self, hospital, situation; answering questions appropriately, no focal deficitsMedications:SCHEDULED:Current Facility-Administered Medications Medication Dose Route Frequency Provider Last Rate Last Admin ? apixaban (ELIQUIS) tablet 5 mg  5 mg Oral BID Forestine Na, MD   5 mg at 08/25/22 0902 ? pantoprazole (PROTONIX) EC tablet 40 mg  40 mg Oral Q12H Madaline Guthrie, APRN   40 mg at 08/25/22 4782 ? pregabalin (LYRICA) capsule 150 mg  150 mg Oral BID Forestine Na, MD   150 mg at 08/25/22 0902 ? rosuvastatin (CRESTOR) tablet 40 mg  40 mg Oral Daily Forestine Na, MD   40 mg at 08/25/22 0902 ? senna (SENOKOT) tablet 8.6 mg  1 tablet Oral Nightly Ramos, Rey F, MD     ? sodium chloride 0.9 % flush 3 mL  3 mL IV Push Q8H Ramos, Vida Roller, MD     ? topiramate (TOPAMAX) tablet 25 mg  25 mg Oral Nightly Forestine Na, MD     NFA:OZHYQMVHQIONG, ALPRAZolam, HYDROmorphone, HYDROmorphone, metoclopramide, polyethylene glycol, sodium chlorideMetabolic/Lytes: Recent Labs Lab 09/27/231957 09/28/230351 NA 141 140 K 3.8 3.9 CO2 20 24 CL 109* 106 ANIONGAP 12 10 BUN 11 14 CREATININE 1.01 1.11 CALCIUM 9.4 9.3 MG  --  2.1 PHOS  --  4.3 CBC, Liver Chemistries, Coags: Recent Labs Lab 09/27/231957 09/27/232049 09/28/230351 WBC 10.1  --  10.0 HGB 14.5  --  14.5 PLT 282  --  232 INR  --  1.04  --  PTT  --  30.8  --  Glucose Trend: Recent Labs Lab 09/27/231957 09/28/230351 GLU 87 79 Inflammatory Marker Monitoring: Lab Results Component Value Date  SARSCOV2 Negative 12/26/2021  DDIMER 0.90 (H) 07/28/2022  PROCALCITON 0.06 07/05/2020  BNPPRO 47.0 12/01/2020  IRON 34 (L) 10/23/2020  TIBC 380 10/23/2020  FERRITIN 34 10/23/2020 Micro: Lab Results Component Value Date  LABBLOO No Growth after 5 days of incubation 07/02/2020  LABBLOO No Growth after 5 days of incubation 07/02/2020  LABBLOO No Growth after 5 days of incubation 06/15/2020  LABBLOO No Growth after 5 days of incubation 06/15/2020  LABBLOO No Growth after 5 days of incubation 05/21/2020  LABBLOO No Growth after 5 days of incubation 05/21/2020  LABURIN >=100,000 CFU/mL Escherichia coli (A) 11/24/2021  LABURIN >=100,000 CFU/mL Escherichia coli (A) 11/08/2021  LABURIN >=100,000 CFU/mL Escherichia coli (A) 09/10/2021  LABURIN >=100,000 CFU/mL Enterobacter aerogenes (A) 07/30/2021  LABURIN >=100,000 CFU/mL Enterococcus  faecalis (A) 01/06/2021  LABURIN No Growth 07/04/2020 Imaging:CXR (08/24/2022 10:12 PM)IMPRESSION: No evidence of an acute cardiopulmonary  process or unexpected radiopaque foreign bodies.Procedural:EGD 09/27/2023Esophagus with food bolus and total obstruction just at the upper esophageal sphincter which required prolonged piecemeal retrieval of the food bolus under direct visualization via jumbo forceps until eventually remaining bolus was safely pushed into the stomachSuspect narrowing/stricture at ~15cm from the incisures of unclear etiology at this point. Upon food bolus clearance, endoscope could be easily advanced and mild-to-moderated circumferential erythema of the mucosa was noted with no active bleeding.Impression & Recommendations In summary, this is a 71 year old with history of COPD, PE (on eliquis), GERD, who presents with acute esophageal food impaction after eating pot roast. 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 EGD performed on 09/27 remarkable for food bolus and total obstruction in the upper esophageal sphincter which required prolonged piece meal retrieval.  Suspected narrowing/stricture at 15 cm from the incisors. Recommendations:- Please obtain esophagram to further evaluate suspected narrowing/stricture- Advance diet as patient toleratesWe will continue to follow.Please let us know if the luminal GI team can be of any further service for this patient.Case discussed with attending, final attestation to follow. Please do not hesitate to contact our team with any questions or concerns.Derrill Kay, MDYale Gastroenterology and Hepatology FellowBest Contact: [facility]  WRUEAVWU after 5pm and weekends, please contact on-call fellow as listed on [place]

## 2022-08-25 NOTE — Other
PACU to Floor Nursing Transfer NotePreop Diagnosis: Food impaction of esophagus [T18.128A]Procedure Done: Food impaction of esophagus [527229]Nerve Block: N/AAny Significant Events Intra-Op: noneAbnormal Assessment in PACU: noneLevel of Consciousness: awake and alert Last Set of VS:  Vitals:  08/24/22 1917 08/24/22 2055 08/25/22 0126 BP: 124/80 107/68 132/66 Pulse: 90 83 84 Resp: 17 16 18  Temp: 97.1 ?F (36.2 ?C)  97.4 ?F (36.3 ?C) SpO2: 94% 95% 95% Device (Oxygen Therapy): nasal cannula O2 Flow (L/min): 2Baseline Neuro/developmental Status: WDLLabs Collected: N/ASpecial Needs of the Patient: noneAntibiotics: N/AIV Access: Periph IV 08/24/22 2128 basilic (5th finger side), right over-the-needle catheter system 20 gauge (Active) IV Fluids: Pain Assessment: Number Scale (0-10) 08/25/22 0128 throat-Pain Rating (0-10): Rest: 0            Time of Last Void or Time that Urinary Catheter was Removed Intra-Op: last voided at: pt performed straight cath self at 9pm Additional Info: 47M s/p EGD. A/ox4. Vital signs stable. No c/o pain. Tolerating secretions. +coughing, on 2L NC. IS used. O2 saturation increased from 91% to 93-95% on 2L NC. +cough, non productive. Plan to admit overnight and d/c in AM. All belongings with patient in PACUContact RN (name and phone number): [facility]

## 2022-08-25 NOTE — Other
Post Anesthesia Transfer of Care NotePatient: Henry Paynter HelgesonProcedure(s) Performed: Procedure(s) (LRB):UPPER GI ENDOSCOPY (N/A) Last Vitals:  Vitals Value Taken Time BP 132/66 08/25/22 0127 Temp 97.7 08/25/22 0127 Pulse 84 08/25/22 0126 Resp 18 08/25/22 0126 SpO2 95 % 08/25/22 0126 Vitals shown include unvalidated device data.POSTOP HANDOFF :      Patient Location:  PACU     Level of Consciousness:  Awake     VS stable since last recorded intra-op set? Yes       Oxygen source: room air     Intra-operative Complications Occurred: None  Intra-operative Intake & Output and Antibiotics as per Anesthesia record and discussed with the RN.

## 2022-08-25 NOTE — Plan of Care
Plan of Care Overview/ Patient Status    0230 - 0700Patient arrived to NP1 from PACU post-EGD for food disimpaction. He is A/O x 4 on 3L NC. No c/o SOB, pain, or N/V. IVF continued. Patient refused assessment when arriving to unit. MD notified. Patient allowed for AM labs to be drawn, and was asked to be left alone (see event note). Safety maintained.

## 2022-08-25 NOTE — Other
Operative Diagnosis:Pre-op:   Food impaction of esophagus [T18.128A] Patient Coded Diagnosis   Pre-op diagnosis: Food impaction of esophagus  Post-op diagnosis: Food impaction of esophagus  Patient Diagnosis   Pre-op diagnosis: Food impaction of esophagus [T18.128A]  Post-op diagnosis: Food impaction of esophagus [527229]    Post-op diagnosis:   * Food impaction of esophagus [Z61.096E][id number] Procedure(s) :Procedure(s) (LRB):UPPER GI ENDOSCOPY (N/A)Post-op Procedure & Diagnosis ConfirmationPost-op Diagnosis: Post-op Diagnosis confirmed (no changes)Post-op Procedure: Post-op Procedure confirmed (no changes)

## 2022-08-25 NOTE — Utilization Review (ED)
UM Status: Managed Medicare IP, EGD for food impaction.

## 2022-08-25 NOTE — Procedures
GI Brief Post-Procedure Note Procedure(s) Performed: Procedure:    UPPER GI ENDOSCOPYCPT(R) Code:  16109 - PR ESOPHAGOGASTRODUODENOSCOPY TRANSORAL DIAGNOSTICSurgeon(s) and Role:   * Cy Blamer, Wilfred Curtis, MD - Primary   * Aslanian, Carl Best, MD - AssistingSummary of endoscopic findings:Esophagus with food bolus and total obstruction just at the upper esophageal sphincter which required prolonged piecemeal retrieval of the food bolus under direct visualization via jumbo forceps until eventually remaining bolus was safely pushed into the stomachSuspect narrowing/stricture at ~15cm from the incisors of unclear etiology at this point. Upon food bolus clearance, endoscope could be easily advanced and mild-to-moderated circumferential erythema of the mucosa was noted with no active bleeding.Solid food seen in the stomach. Normal-appearing mucosa in the duodenum up to D2.* No implants in log *      Prior to Admission Medications Medication Name Sig Taking? Patient Reported   ALPRAZolam (XANAX) 0.5 mg tabletLast dose:  --  TAKE 1 TABLET BY MOUTH TWICE A DAY   Yes   apixaban (ELIQUIS) 5 mg tabletLast dose: 08/24/2022 Take 1 tablet (5 mg total) by mouth 2 (two) times daily. Yes     ascorbic acid, vitamin C, (VITAMIN C) 500 mg tabletLast dose:  --  Take 500 mg by mouth 2 (two) times daily.    Yes   HYDROmorphone (DILAUDID) 4 mg tabletLast dose: 08/23/2022 Take 1 tablet (4 mg total) by mouth every 6 (six) hours as needed (Pain). Yes Yes   MOUNJARO 5 mg/0.5 mL PnIjLast dose:  --  INJECT 5MG  SUBCUTANEOUS WEEKLY 28 DAYS   Yes   MOVANTIK 25 mg TabLast dose:  --  TAKE 1 TABLET BY MOUTH EVERY DAY IN THE MORNING FOR 30 DAYS   Yes   omeprazole (PRILOSEC) 20 mg capsuleLast dose:  --  None Entered   Yes   potassium chloride (K-TAB,KLOR-CON) 10 MEQ extended release tabletLast dose:  --  Take 1 tablet (10 mEq total) by mouth daily.   Yes   pregabalin (LYRICA) 150 mg capsuleLast dose:  --  TAKE 1 CAPSULE BY MOUTH TWICE A DAY FOR 30 DAYS   Yes   rosuvastatin (CRESTOR) 40 mg tabletLast dose:  --  Take 1 tablet (40 mg total) by mouth daily.       senna (SENOKOT) 8.6 mg tabletLast dose:  --  Take 1 tablet by mouth daily.   Yes   tadalafiL (CIALIS) 5 mg tabletLast dose:  --  Take 1 tablet (5 mg total) by mouth daily.       topiramate (TOPAMAX) 25 mg tabletLast dose:  --  None Entered   Yes   Prior to admission medications last reviewed by Rea [employer], RN on Wed Aug 24, 2022 2217 Current Facility-Administered Medications Medication Dose Route Frequency Provider Last Rate Last Admin Recommendations:Please obtain esophagram.Diet Clear liquid diet, advance to regular as toleratedPPI usePPI BIDSystemic anticoagulationResume eliquis tomorrowAntiplatelet useN/AAntibiotic useNo ongoing antibiotics required from GI standpointOther RecommendationsIncrease PPI BIDF/u in GI clinic in 1 week and unless otherwise recommended by Primary Gastroenterologist, repeat EGD with biopsies in 2 weeksChew food and preferably eat softer foods until further instructed by teamPatient updated with findingsPrimary team updated with findings Final procedure note will be available in Epic under Chart Review -> Procedures? tab once completed by attending with finalized findings and recommendations.Earma Reading, MD

## 2022-08-25 NOTE — Significant Event
Patient became frustrated when this RN began to assess and ask admission questions. The patient stated, I want to go to sleep. Don't touch my stuff. Patient admitted he had personal medication, hydromorphone, with him from home. The patient was told any personal medications must be locked in a secure safe on the unit. Patient began dressing himself saying he will sign out. Dr. Elesa Hacker was contacted and security called for safety check. After extensive discussion the patient was agreeable to let staff secure his medication. Medication counted at the bedside with security and documented. Medication was safely put in safe on unit. Handoff completed with daytime RN.

## 2022-08-25 NOTE — ED Notes
7:25 PM Patient arrives for the evaluation of foreign body in throat. Patient reports that he was eating pot roast when he got a bone stuck in his throat. Patient reports that this has happened before. Patient denies chest pain/ shortness of breath. Patient noted to have fits of coughing up phlegm. Airway is patent and intact. 8:32 PM GI at bedside. 9:29 PM Report given to PACU RN.

## 2022-08-25 NOTE — ED Provider Notes
Chief Complaint Patient presents with ? Foreign Body In Throat   Piece of pot roast stuck in esophagus. Unable to swallow, spitting up sputum. Hx of similar incidences. Airway patent.  MDM PGY-3 Emergency Medicine Resident MDM: -----------------Vital Signs: BP 112/67  - Pulse 80  - Temp 97.7 ?F (36.5 ?C) (Oral)  - Resp 16  - SpO2 97% Presentation:The patient is a 71 y.o. male with a hx of COPD, GERD, recent PE propoagation on xeralto?converted to warfarin, h/o?wedge resection, Hx of giant cell interstitial pneumonia, cricopharyngeal dysfunction who presents with FOB sensation in throat. Per the patient, he was eating pot roast when he felt that they got stuck in his throat since then he has been unable to swallow states that he has been spitting up his sputum, he has a similar history of symptoms.  Patient states he has no respiratory distress or respiratory complaints. Pertinent exam findings include: Gen: AOx3, nadHEENT:  No obvious foreign body in superficial oropharynx, however patient unable to tolerate p.o. liquids at bedside and has been spitting it out.  Phonating well. CV: RRRPulm: CTABLAbd: soft, non-tender, non-distendedExt: no LE edema, no rashesPlan:  CBC BMP chest x-ray EKG GI consult EKGCourse: labs unremarkable, added on coags, per GI --> endoscopy with glucagon Dispo: This patient was presented to and discussed with Dr. Winnifred Friar and a treatment plan and disposition were collaboratively agreed upon.Michel Bickers MDYale Emergency MedicinePGY-3----------------------------------------------------------------------------------------- Physical ExamED Triage Vitals [08/24/22 1917]BP: 124/80Pulse: 90Pulse from  O2 sat: n/aResp: 17Temp: 97.1 ?F (36.2 ?C)Temp src: n/aSpO2: 94 % BP 112/67  - Pulse 80  - Temp 97.7 ?F (36.5 ?C) (Oral)  - Resp 16  - SpO2 97% Physical Exam ProceduresAttestation/Critical CarePatient Reevaluation: Attending Supervised: ResidentI saw and examined the patient. I agree with the findings and plan of care as documented in the resident's note except as noted below. Additional acute and/or chronic problems addressed:71 y.o. male here with esophageal food impaction. Well-appearing, no abnormal breath sounds, able to manage secretions, not vomiting, benign abdominal exam. Low suspicion for aspiration, obstruction. We have spoken with GI, recommend glucagon and they will take pt to endoscopy.Jamesetta So Impressions as of 08/25/22 0248 Food impaction of esophagus, initial encounter  ED DispositionSend To Or/Procedure Jory Sims, MD09/28/23 [phone]
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
## 2022-08-25 NOTE — Plan of Care
Plan of Care Overview/ Patient Status    Case Management Screening and Evaluation  Flowsheet Row Most Recent Value Case Management Screening: Chart review completed. If YES to any question below then proceed to CM Eval/Plan  Is there a change in their cognitive function No Do you anticipate a change in this patient's physicial function that will effect discharge needs? No Has there been a readmission within the last 30 days No Were there services prior to admission ( Examples: Assisted Living, HD, Homecare, Extended Care Facility, Methadone, SNF, Outpatient Infusion Center) No Negative/Positive Screen Negative Screening: Case Management department will follow patient's progress and discuss plan of care with treatment team. Case Manager Attestation  I have reviewed the medical record and completed the above screen. CM staff will follow patient's progress and discuss the plan of care with the Treatment Team. Yes   HPI: 71 y/o male w h/o COPD, GERD, PE 2019 on Eliquis w h/o wedge resection, giat cell interstitial PNA, chronic pain on dilaudid, and cricopharyngeal dysfunction who p/w food impaction and is now s/p EGD with removal of food impaction.  Pt feels improved.Pt from home, independent at baseline mobility, no known services.Assessment screening completed. Continue to follow patient's progress and discuss plan of care with treatment team.  Discharge needs not determined at this time. Care Management will continue to follow with team.  Lorane Gell RN,BSNCare ManagerMHB :[phone]
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
## 2022-08-26 ENCOUNTER — Encounter: Admit: 2022-08-26 | Payer: PRIVATE HEALTH INSURANCE | Primary: Family Medicine

## 2022-08-26 NOTE — Progress Notes
[facility] Care Navigation:  Transition of Care Note Care Navigation spoke with: PatientDischarging Hospital: [facility] Admission Date: 9/27/2023Hospital Discharge Date: 08/25/2022    Diagnosis: Food impaction of esophagus, initial encounterDischarge location: OtherHOSPITALIZATION:Reason patient admitted: Food impaction of esophagus, initial encounterDiagnosis at discharge: Food impaction of esophagus, initial encounterCURRENT STATE:Since discharge patient reports feeling: Better Patient cared for by: NoneQuestions regarding surgical site: No REVIEW OF AFTER VISIT SUMMARY DOCUMENT: MEDICATION CHANGES:Validated NEW medications to take: N/AValidated Changed medications to take: N/AValidated Stopped medications to NOT take: N/AIssues obtaining prescriptions: N/A FOLLOW-UP APPOINTMENTS and TRANSPORTATION:Patient aware of scheduled appointments: N/AAwareness and assistance with appointments needing to be scheduled: N/ATransportation concerns for follow-up appointment: N/ADME and HOME HEALTH SERVICES:Durable medical equipment received: N/AContact has been made with home care agency: N/APlan established for follow up labs/tests: N/A ADDITIONAL PATIENT NEEDS: Additional patient needs addressed: None stated feeling better at this time

## 2022-09-29 ENCOUNTER — Telehealth: Admit: 2022-09-29 | Payer: PRIVATE HEALTH INSURANCE | Attending: Urology | Primary: Family Medicine

## 2022-09-30 MED ORDER — TADALAFIL 5 MG TABLET
5 mg | ORAL_TABLET | Freq: Every day | ORAL | 4 refills | Status: AC
Start: 2022-09-30 — End: ?

## 2022-09-30 NOTE — Telephone Encounter
RNP scheduled on 02/13/23 at 9:30am with Dr. Hulen Luster. Please refill medication until appointment. Thank you

## 2022-09-30 NOTE — Telephone Encounter
Please schedule pt for appt to establish care with new provider, last seen by [facility] 10/2021

## 2022-09-30 NOTE — Telephone Encounter
Rx sent by Dr. Kirtland Bouchard

## 2022-10-08 ENCOUNTER — Encounter: Admit: 2022-10-08 | Payer: PRIVATE HEALTH INSURANCE | Attending: Medical Oncology | Primary: Family Medicine

## 2022-10-08 DIAGNOSIS — I8291 Chronic embolism and thrombosis of unspecified vein: Secondary | ICD-10-CM

## 2022-10-08 DIAGNOSIS — I2782 Chronic pulmonary embolism: Secondary | ICD-10-CM

## 2022-10-08 DIAGNOSIS — Z7901 Long term (current) use of anticoagulants: Secondary | ICD-10-CM

## 2022-10-09 MED ORDER — ELIQUIS 5 MG TABLET
5 mg | ORAL_TABLET | Freq: Two times a day (BID) | ORAL | 4 refills | Status: AC
Start: 2022-10-09 — End: ?

## 2023-01-07 ENCOUNTER — Emergency Department: Admit: 2023-01-07 | Payer: PRIVATE HEALTH INSURANCE | Primary: Family Medicine

## 2023-01-07 ENCOUNTER — Inpatient Hospital Stay: Admit: 2023-01-07 | Discharge: 2023-01-07 | Payer: PRIVATE HEALTH INSURANCE | Primary: Family Medicine

## 2023-01-07 DIAGNOSIS — J449 Chronic obstructive pulmonary disease, unspecified: Secondary | ICD-10-CM

## 2023-01-07 DIAGNOSIS — Z7901 Long term (current) use of anticoagulants: Secondary | ICD-10-CM

## 2023-01-07 DIAGNOSIS — Z885 Allergy status to narcotic agent status: Secondary | ICD-10-CM

## 2023-01-07 DIAGNOSIS — K920 Hematemesis: Secondary | ICD-10-CM

## 2023-01-07 DIAGNOSIS — R1084 Generalized abdominal pain: Secondary | ICD-10-CM

## 2023-01-07 DIAGNOSIS — K219 Gastro-esophageal reflux disease without esophagitis: Secondary | ICD-10-CM

## 2023-01-07 DIAGNOSIS — Z888 Allergy status to other drugs, medicaments and biological substances status: Secondary | ICD-10-CM

## 2023-01-07 DIAGNOSIS — G8929 Other chronic pain: Secondary | ICD-10-CM

## 2023-01-07 DIAGNOSIS — M545 Low back pain, unspecified: Secondary | ICD-10-CM

## 2023-01-07 DIAGNOSIS — Z85828 Personal history of other malignant neoplasm of skin: Secondary | ICD-10-CM

## 2023-01-07 DIAGNOSIS — M1990 Unspecified osteoarthritis, unspecified site: Secondary | ICD-10-CM

## 2023-01-07 DIAGNOSIS — I10 Essential (primary) hypertension: Secondary | ICD-10-CM

## 2023-01-07 DIAGNOSIS — E7800 Pure hypercholesterolemia, unspecified: Secondary | ICD-10-CM

## 2023-01-07 DIAGNOSIS — Z86711 Personal history of pulmonary embolism: Secondary | ICD-10-CM

## 2023-01-07 LAB — COMPREHENSIVE METABOLIC PANEL
BKR A/G RATIO: 1.4 (ref 1.0–2.2)
BKR ALANINE AMINOTRANSFERASE (ALT): 32 U/L (ref 9–59)
BKR ALBUMIN: 4.2 g/dL (ref 3.6–4.9)
BKR ALKALINE PHOSPHATASE: 170 U/L — ABNORMAL HIGH (ref 9–122)
BKR ANION GAP: 13 (ref 7–17)
BKR ASPARTATE AMINOTRANSFERASE (AST): 43 U/L — ABNORMAL HIGH (ref 10–35)
BKR AST/ALT RATIO: 1.3
BKR BILIRUBIN TOTAL: 0.5 mg/dL (ref <=1.2)
BKR BLOOD UREA NITROGEN: 11 mg/dL (ref 8–23)
BKR BUN / CREAT RATIO: 14.3 % — ABNORMAL LOW (ref 8.0–23.0)
BKR CALCIUM: 9 mg/dL — ABNORMAL HIGH (ref 8.8–10.2)
BKR CHLORIDE: 106 mmol/L (ref 98–107)
BKR CO2: 24 mmol/L (ref 20–30)
BKR CREATININE: 0.77 mg/dL (ref 0.40–1.30)
BKR EGFR, CREATININE (CKD-EPI 2021): >60 mL/min/{1.73_m2} (ref >=60)
BKR GLOBULIN: 3.1 g/dL (ref 2.3–3.5)
BKR GLUCOSE: 111 mg/dL — ABNORMAL HIGH (ref 70–100)
BKR POTASSIUM: 4.2 mmol/L (ref 3.3–5.3)
BKR PROTEIN TOTAL: 7.3 g/dL (ref 6.6–8.7)
BKR SODIUM: 143 mmol/L (ref 136–144)
BKR WAM MCHC: 13 g/dL (ref 7–17)

## 2023-01-07 LAB — CBC WITH AUTO DIFFERENTIAL
BKR WAM ABSOLUTE IMMATURE GRANULOCYTES.: 0.04 x 1000/??L (ref 0.00–0.30)
BKR WAM ABSOLUTE LYMPHOCYTE COUNT.: 1.12 x 1000/??L (ref 0.60–3.70)
BKR WAM ABSOLUTE NRBC (2 DEC): 0 x 1000/ÂµL (ref 0.00–1.00)
BKR WAM ANALYZER ANC: 9.9 x 1000/??L — ABNORMAL HIGH (ref 2.00–7.60)
BKR WAM BASOPHIL ABSOLUTE COUNT.: 0.05 x 1000/??L (ref 0.00–1.00)
BKR WAM BASOPHILS: 0.4 % (ref 0.0–1.4)
BKR WAM EOSINOPHIL ABSOLUTE COUNT.: 0.62 x 1000/ÂµL (ref 0.00–1.00)
BKR WAM EOSINOPHILS: 5.1 % — ABNORMAL HIGH (ref 0.0–5.0)
BKR WAM HEMATOCRIT (2 DEC): 46.4 % (ref 38.50–50.00)
BKR WAM HEMOGLOBIN: 15.2 g/dL (ref 13.2–17.1)
BKR WAM IMMATURE GRANULOCYTES: 0.3 % (ref 0.0–1.0)
BKR WAM LYMPHOCYTES: 9.2 % — ABNORMAL LOW (ref 17.0–50.0)
BKR WAM MCH (PG): 29.3 pg (ref 27.0–33.0)
BKR WAM MCV: 89.4 fL (ref 80.0–100.0)
BKR WAM MONOCYTE ABSOLUTE COUNT.: 0.4 x 1000/??L (ref 0.00–1.00)
BKR WAM MONOCYTES: 3.3 % — ABNORMAL LOW (ref 4.0–12.0)
BKR WAM MPV: 10.6 fL (ref 8.0–12.0)
BKR WAM NEUTROPHILS: 81.7 % — ABNORMAL HIGH (ref 39.0–72.0)
BKR WAM NUCLEATED RED BLOOD CELLS: 0 % (ref 0.0–1.0)
BKR WAM PLATELETS: 225 x1000/??L (ref 150–420)
BKR WAM RDW-CV: 15 % — ABNORMAL HIGH (ref 11.0–15.0)
BKR WAM RED BLOOD CELL COUNT.: 5.19 M/??L (ref 4.00–6.00)
BKR WAM WHITE BLOOD CELL COUNT: 12.1 x1000/??L — ABNORMAL HIGH (ref 4.0–11.0)

## 2023-01-07 LAB — PROTIME AND INR
BKR INR: 1.02 x 1000/??L (ref 0.86–1.12)
BKR PROTHROMBIN TIME: 11.3 s (ref 9.6–12.3)

## 2023-01-07 MED ORDER — ONDANSETRON HCL (PF) 4 MG/2 ML INJECTION SOLUTION
42 mg/2 mL | Freq: Once | INTRAVENOUS | Status: CP
Start: 2023-01-07 — End: ?
  Administered 2023-01-07: 14:00:00 4 mL via INTRAVENOUS

## 2023-01-07 MED ORDER — MORPHINE 4 MG/ML INTRAVENOUS SOLUTION
4 mg/mL | Freq: Once | SUBCUTANEOUS | Status: DC
Start: 2023-01-07 — End: 2023-01-07

## 2023-01-07 MED ORDER — PANTOPRAZOLE IV PUSH 40 MG VIAL & NS (ADULTS)
Freq: Once | INTRAVENOUS | Status: CP
Start: 2023-01-07 — End: ?
  Administered 2023-01-07: 13:00:00 20.000 mL via INTRAVENOUS

## 2023-01-07 MED ORDER — HYDROMORPHONE 4 MG TABLET
4 mg | Freq: Once | ORAL | Status: CP
Start: 2023-01-07 — End: ?
  Administered 2023-01-07: 15:00:00 4 mg via ORAL

## 2023-01-07 MED ORDER — SODIUM CHLORIDE 0.9 % LARGE VOLUME SYRINGE FOR AUTOINJECTOR
Freq: Once | INTRAVENOUS | Status: CP | PRN
Start: 2023-01-07 — End: ?
  Administered 2023-01-07: 14:00:00 via INTRAVENOUS

## 2023-01-07 MED ORDER — IOHEXOL 350 MG IODINE/ML INTRAVENOUS SOLUTION
350 mg iodine/mL | Freq: Once | INTRAVENOUS | Status: CP | PRN
Start: 2023-01-07 — End: ?
  Administered 2023-01-07: 14:00:00 350 mL via INTRAVENOUS

## 2023-01-07 NOTE — ED Notes
11:45 PM - this RN in room with patient. Pt reports improved pain and nausea. Pt resting on stretcher in NAD, respirations even and unlabored.  A&O x4, VSS.  Pt requesting to be discharged and speak with a provider. MD Bolte made aware1:10 PM - attending and MD at bedside. AMA paperwork signed by patient with MD Cindy Hazy and MD Della-Gustina
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
## 2023-01-07 NOTE — Discharge Instructions
You have requested to leave the hospital against medical advice. Per our discussion you understand the risks of leaving the hospital and the benefits of staying. Some of the risks discussed include: continued GI bleeding which may become severe and result in further injury or death. Additionally, it is recommended that you be admitted to the hospital for further work-up, monitoring, and treatment of undifferentiated abdominal pain. By leaving the hospital this workup cannot be completed.  You can come back to the ED at any time. Call for an ambulance right away if: You have sudden severe belly pain, or the pain is constant.You have trouble breathing or chest pain along with your belly pain.You start throwing up blood or pass a lot of blood in your stool.Your belly becomes very hard or swollen.You get a fever 102.61F (39C) or higher or shaking chills.Return to the ED if: You have signs of severe fluid loss, such as:No urine for more than 8 hours.You feel very light-headed or like you are going to pass out.You feel weak, like you are going to fall.Your pain gets worse, comes more often or moves to one area of the bellyYou have an upset stomach or throwing up that isnt getting better and are having trouble keeping down food and drink.Your stools are black or tar colored.

## 2023-01-07 NOTE — Utilization Review (ED)
Coffee ground emesis, on Eliquis, WBC 12.1, Hgb 15.2, Hct 46.40. Zofran IV 8 mg, Protonix IV.  Communicated with MD Bolte, T regarding Observation status, agreeable. UM Status: UR/MD agree on OBS status Hilbert Odor, BSN-RN, MSRNUtilization Review Specialist

## 2023-01-08 ENCOUNTER — Encounter: Admit: 2023-01-08 | Payer: PRIVATE HEALTH INSURANCE | Attending: Emergency Medicine | Primary: Family Medicine

## 2023-01-08 ENCOUNTER — Inpatient Hospital Stay: Admit: 2023-01-08 | Payer: PRIVATE HEALTH INSURANCE | Admitting: Gastroenterology

## 2023-01-08 ENCOUNTER — Emergency Department: Admit: 2023-01-08 | Payer: PRIVATE HEALTH INSURANCE | Primary: Family Medicine

## 2023-01-08 ENCOUNTER — Inpatient Hospital Stay
Admit: 2023-01-08 | Discharge: 2023-01-11 | Payer: PRIVATE HEALTH INSURANCE | Attending: Hospitalist | Admitting: Student in an Organized Health Care Education/Training Program

## 2023-01-08 ENCOUNTER — Ambulatory Visit: Admit: 2023-01-08 | Payer: PRIVATE HEALTH INSURANCE | Attending: Anesthesiology | Primary: Family Medicine

## 2023-01-08 DIAGNOSIS — I10 Essential (primary) hypertension: Secondary | ICD-10-CM

## 2023-01-08 DIAGNOSIS — E7800 Pure hypercholesterolemia, unspecified: Secondary | ICD-10-CM

## 2023-01-08 DIAGNOSIS — M720 Palmar fascial fibromatosis [Dupuytren]: Secondary | ICD-10-CM

## 2023-01-08 DIAGNOSIS — K219 Gastro-esophageal reflux disease without esophagitis: Secondary | ICD-10-CM

## 2023-01-08 DIAGNOSIS — Z5189 Encounter for other specified aftercare: Secondary | ICD-10-CM

## 2023-01-08 DIAGNOSIS — K920 Hematemesis: Secondary | ICD-10-CM

## 2023-01-08 DIAGNOSIS — R112 Nausea with vomiting, unspecified: Secondary | ICD-10-CM

## 2023-01-08 DIAGNOSIS — N390 Urinary tract infection, site not specified: Secondary | ICD-10-CM

## 2023-01-08 DIAGNOSIS — S060XAA Concussion: Secondary | ICD-10-CM

## 2023-01-08 DIAGNOSIS — M1990 Unspecified osteoarthritis, unspecified site: Secondary | ICD-10-CM

## 2023-01-08 DIAGNOSIS — G43909 Migraine, unspecified, not intractable, without status migrainosus: Secondary | ICD-10-CM

## 2023-01-08 DIAGNOSIS — R3989 Other symptoms and signs involving the genitourinary system: Secondary | ICD-10-CM

## 2023-01-08 DIAGNOSIS — J449 Chronic obstructive pulmonary disease, unspecified: Secondary | ICD-10-CM

## 2023-01-08 DIAGNOSIS — N319 Neuromuscular dysfunction of bladder, unspecified: Secondary | ICD-10-CM

## 2023-01-08 DIAGNOSIS — N400 Enlarged prostate without lower urinary tract symptoms: Secondary | ICD-10-CM

## 2023-01-08 DIAGNOSIS — N23 Unspecified renal colic: Secondary | ICD-10-CM

## 2023-01-08 DIAGNOSIS — R319 Hematuria, unspecified: Secondary | ICD-10-CM

## 2023-01-08 DIAGNOSIS — Z8711 Personal history of peptic ulcer disease: Secondary | ICD-10-CM

## 2023-01-08 DIAGNOSIS — R092 Respiratory arrest: Secondary | ICD-10-CM

## 2023-01-08 DIAGNOSIS — C4491 Basal cell carcinoma of skin, unspecified: Secondary | ICD-10-CM

## 2023-01-08 DIAGNOSIS — K760 Fatty (change of) liver, not elsewhere classified: Secondary | ICD-10-CM

## 2023-01-08 DIAGNOSIS — A419 Sepsis, unspecified organism: Secondary | ICD-10-CM

## 2023-01-08 LAB — COMPREHENSIVE METABOLIC PANEL
BKR A/G RATIO: 0.8
BKR ALANINE AMINOTRANSFERASE (ALT): 28 U/L (ref 12–78)
BKR ALBUMIN: 3.1 g/dL — ABNORMAL LOW (ref 3.4–5.0)
BKR ALKALINE PHOSPHATASE: 137 U/L — ABNORMAL HIGH (ref 20–135)
BKR ANION GAP: 5 (ref 5–18)
BKR ASPARTATE AMINOTRANSFERASE (AST): 16 U/L (ref 5–37)
BKR AST/ALT RATIO: 0.6
BKR BILIRUBIN TOTAL: 0.7 mg/dL (ref 0.0–1.0)
BKR BLOOD UREA NITROGEN: 10 mg/dL (ref 8–25)
BKR BUN / CREAT RATIO: 10.2 (ref 8.0–25.0)
BKR CALCIUM: 8.3 mg/dL — ABNORMAL LOW (ref 8.4–10.3)
BKR CHLORIDE: 108 mmol/L (ref 95–115)
BKR CO2: 27 mmol/L (ref 21–32)
BKR CREATININE: 0.98 mg/dL (ref 0.50–1.30)
BKR EGFR, CREATININE (CKD-EPI 2021): >60 mL/min/{1.73_m2} (ref >=60)
BKR GLOBULIN: 4 g/dL
BKR GLUCOSE: 152 mg/dL — ABNORMAL HIGH (ref 70–100)
BKR OSMOLALITY CALCULATION: 281 mosm/kg (ref 275–295)
BKR POTASSIUM: 3.5 mmol/L (ref 3.5–5.1)
BKR PROTEIN TOTAL: 7.1 g/dL (ref 6.4–8.2)
BKR SODIUM: 140 mmol/L (ref 136–145)

## 2023-01-08 LAB — CBC WITH AUTO DIFFERENTIAL
BKR WAM ABSOLUTE IMMATURE GRANULOCYTES.: 0.03 x 1000/ÂµL (ref 0.00–0.30)
BKR WAM ABSOLUTE LYMPHOCYTE COUNT.: 1.26 x 1000/ÂµL (ref 0.60–3.70)
BKR WAM ABSOLUTE NRBC (2 DEC): 0 x 1000/ÂµL (ref 0.00–1.00)
BKR WAM ANALYZER ANC: 10.08 x 1000/ÂµL — ABNORMAL HIGH (ref 2.00–7.60)
BKR WAM BASOPHIL ABSOLUTE COUNT.: 0.04 x 1000/ÂµL (ref 0.00–1.00)
BKR WAM BASOPHILS: 0.3 % (ref 0.0–1.4)
BKR WAM EOSINOPHIL ABSOLUTE COUNT.: 0.34 x 1000/ÂµL (ref 0.00–1.00)
BKR WAM EOSINOPHILS: 2.8 % (ref 0.0–5.0)
BKR WAM HEMATOCRIT (2 DEC): 40.8 % (ref 38.50–50.00)
BKR WAM HEMOGLOBIN: 13.1 g/dL — ABNORMAL LOW (ref 13.2–17.1)
BKR WAM IMMATURE GRANULOCYTES: 0.2 % (ref 0.0–1.0)
BKR WAM LYMPHOCYTES: 10.2 % — ABNORMAL LOW (ref 17.0–50.0)
BKR WAM MCH (PG): 28.4 pg (ref 27.0–33.0)
BKR WAM MCHC: 32.1 g/dL (ref 31.0–36.0)
BKR WAM MCV: 88.5 fL (ref 80.0–100.0)
BKR WAM MONOCYTE ABSOLUTE COUNT.: 0.61 x 1000/ÂµL (ref 0.00–1.00)
BKR WAM MONOCYTES: 4.9 % (ref 4.0–12.0)
BKR WAM MPV: 10.4 fL (ref 8.0–12.0)
BKR WAM NEUTROPHILS: 81.6 % — ABNORMAL HIGH (ref 39.0–72.0)
BKR WAM NUCLEATED RED BLOOD CELLS: 0 % (ref 0.0–1.0)
BKR WAM PLATELETS: 221 x1000/ÂµL (ref 150–420)
BKR WAM RDW-CV: 15.6 % — ABNORMAL HIGH (ref 11.0–15.0)
BKR WAM RED BLOOD CELL COUNT.: 4.61 M/ÂµL (ref 4.00–6.00)
BKR WAM WHITE BLOOD CELL COUNT: 12.4 x1000/ÂµL — ABNORMAL HIGH (ref 4.0–11.0)

## 2023-01-08 LAB — RESPIRATORY VIRUS PCR PANEL     (BH GH LMW Q YH)
BKR ADENOVIRUS: NOT DETECTED
BKR HUMAN METAPNEUMOVIRUS (HMPV): NOT DETECTED
BKR INFLUENZA A H1: NOT DETECTED
BKR INFLUENZA A H3: NOT DETECTED
BKR INFLUENZA A: NOT DETECTED
BKR INFLUENZA B: NOT DETECTED
BKR PARAINFLUENZA VIRUS 1: NOT DETECTED
BKR PARAINFLUENZA VIRUS 2: NOT DETECTED
BKR PARAINFLUENZA VIRUS 3: NOT DETECTED
BKR PARAINFLUENZA VIRUS 4: NOT DETECTED
BKR RESPIRATORY SYNCYTIAL VIRUS A: NOT DETECTED
BKR RESPIRATORY SYNCYTIAL VIRUS B: NOT DETECTED
BKR RHINOVIRUS: NOT DETECTED

## 2023-01-08 LAB — SARS COV-2 (COVID-19) RNA: BKR SARS-COV-2 RNA (COVID-19) (YH): NEGATIVE

## 2023-01-08 LAB — URINALYSIS WITH CULTURE REFLEX      (BH LMW YH)
BKR BILIRUBIN, UA: NEGATIVE
BKR BLOOD, UA: NEGATIVE
BKR GLUCOSE, UA: NEGATIVE
BKR KETONES, UA: NEGATIVE
BKR NITRITE, UA: NEGATIVE
BKR PH, UA: 6 (ref 5.5–7.5)
BKR SPECIFIC GRAVITY, UA: 1.029 (ref 1.005–1.030)
BKR UROBILINOGEN, UA (MG/DL): <2 mg/dL (ref <=2.0)

## 2023-01-08 LAB — CBC WITHOUT DIFFERENTIAL
BKR WAM ANALYZER ANC: 8.25 x 1000/ÂµL — ABNORMAL HIGH (ref 2.00–7.60)
BKR WAM HEMATOCRIT (2 DEC): 38.8 % (ref 38.50–50.00)
BKR WAM HEMOGLOBIN: 12.1 g/dL — ABNORMAL LOW (ref 13.2–17.1)
BKR WAM MCH (PG): 28 pg (ref 27.0–33.0)
BKR WAM MCHC: 31.2 g/dL (ref 31.0–36.0)
BKR WAM MCV: 89.8 fL (ref 80.0–100.0)
BKR WAM MPV: 10.4 fL (ref 8.0–12.0)
BKR WAM PLATELETS: 195 x1000/ÂµL (ref 150–420)
BKR WAM RDW-CV: 15.5 % — ABNORMAL HIGH (ref 11.0–15.0)
BKR WAM RED BLOOD CELL COUNT.: 4.32 M/ÂµL (ref 4.00–6.00)
BKR WAM WHITE BLOOD CELL COUNT: 11.4 x1000/ÂµL — ABNORMAL HIGH (ref 4.0–11.0)

## 2023-01-08 LAB — URINE MICROSCOPIC     (BH GH LMW YH)
BKR RBC/HPF INSTRUMENT: 2 /HPF (ref 0–2)
BKR URINE SQUAMOUS EPITHELIAL CELLS, UA (NUMERIC): 3 /HPF (ref 0–5)
BKR WBC/HPF INSTRUMENT: 9 /HPF — ABNORMAL HIGH (ref 0–5)

## 2023-01-08 LAB — PROCALCITONIN     (BH GH LMW Q YH): BKR PROCALCITONIN: 0.07 ng/mL

## 2023-01-08 LAB — PT/INR AND PTT (BH GH L LMW YH)
BKR INR: 1.13 (ref 0.88–1.14)
BKR PARTIAL THROMBOPLASTIN TIME: 33.3 s — ABNORMAL HIGH (ref 23.0–31.0)
BKR PROTHROMBIN TIME: 11.9 s (ref 9.4–12.0)

## 2023-01-08 LAB — LACTIC ACID, PLASMA: BKR LACTATE: 0.8 mmol/L (ref 0.4–2.0)

## 2023-01-08 LAB — UA REFLEX CULTURE

## 2023-01-08 LAB — HEMOGLOBIN A1C: BKR HEMOGLOBIN A1C: 5.7 %

## 2023-01-08 LAB — LIPASE: BKR LIPASE: 98 U/L (ref 73–393)

## 2023-01-08 LAB — MAGNESIUM: BKR MAGNESIUM: 2.1 mg/dL (ref 1.4–2.2)

## 2023-01-08 LAB — LACTIC ACID, PLASMA (REFLEX 2H REPEAT): BKR LACTATE: 1.5 mmol/L (ref 0.4–2.0)

## 2023-01-08 MED ORDER — ROSUVASTATIN 20 MG TABLET
20 mg | Freq: Every day | ORAL | Status: DC
Start: 2023-01-08 — End: 2023-01-12
  Administered 2023-01-09 – 2023-01-11 (×3): 20 mg via ORAL

## 2023-01-08 MED ORDER — PROPOFOL 10 MG/ML INTRAVENOUS EMULSION
10 mg/mL | INTRAVENOUS | Status: DC | PRN
Start: 2023-01-08 — End: 2023-01-08
  Administered 2023-01-08 (×2): 10 mg/mL via INTRAVENOUS

## 2023-01-08 MED ORDER — SODIUM CHLORIDE 0.9 % (FLUSH) INJECTION SYRINGE
0.9 % | INTRAVENOUS | Status: DC | PRN
Start: 2023-01-08 — End: 2023-01-12

## 2023-01-08 MED ORDER — PANTOPRAZOLE IV PUSH 40 MG VIAL & NS (ADULTS)
Freq: Two times a day (BID) | INTRAVENOUS | Status: DC
Start: 2023-01-08 — End: 2023-01-08

## 2023-01-08 MED ORDER — PREGABALIN 100 MG CAPSULE
100 mg | Freq: Every day | ORAL | Status: DC
Start: 2023-01-08 — End: 2023-01-08

## 2023-01-08 MED ORDER — PROPOFOL 10 MG/ML INTRAVENOUS EMULSION
10 mg/mL | Status: CP
Start: 2023-01-08 — End: ?

## 2023-01-08 MED ORDER — PREGABALIN 150 MG CAPSULE
150 mg | Freq: Every day | ORAL | Status: DC
Start: 2023-01-08 — End: 2023-01-09

## 2023-01-08 MED ORDER — POTASSIUM CHLORIDE ER 10 MEQ TABLET,EXTENDED RELEASE(PART/CRYST)
10 MEQ | Freq: Every day | ORAL | Status: DC
Start: 2023-01-08 — End: 2023-01-12
  Administered 2023-01-10 – 2023-01-11 (×2): 10 MEQ via ORAL

## 2023-01-08 MED ORDER — PANTOPRAZOLE IV PUSH 40 MG VIAL & NS (ADULTS)
Freq: Once | INTRAVENOUS | Status: CP
Start: 2023-01-08 — End: ?
  Administered 2023-01-08: 13:00:00 10.000 mL via INTRAVENOUS

## 2023-01-08 MED ORDER — SODIUM CHLORIDE 0.9 % BOLUS (NEW BAG)
0.9 % | Freq: Once | INTRAVENOUS | Status: CP
Start: 2023-01-08 — End: ?
  Administered 2023-01-08: 12:00:00 0.9 mL/h via INTRAVENOUS

## 2023-01-08 MED ORDER — ALPRAZOLAM 0.5 MG TABLET
0.5 mg | Freq: Two times a day (BID) | ORAL | Status: DC | PRN
Start: 2023-01-08 — End: 2023-01-12
  Administered 2023-01-09 – 2023-01-11 (×2): 0.5 mg via ORAL

## 2023-01-08 MED ORDER — LACTATED RINGERS IV BOLUS FROM BAG
INTRAVENOUS | Status: DC
Start: 2023-01-08 — End: 2023-01-09

## 2023-01-08 MED ORDER — PREGABALIN 150 MG CAPSULE
150 mg | Freq: Two times a day (BID) | ORAL | Status: DC
Start: 2023-01-08 — End: 2023-01-08

## 2023-01-08 MED ORDER — HYDROMORPHONE 1 MG/ML INJECTION SYRINGE
1 mg/mL | INTRAVENOUS | Status: DC | PRN
Start: 2023-01-08 — End: 2023-01-09

## 2023-01-08 MED ORDER — PANTOPRAZOLE 40 MG TABLET,DELAYED RELEASE
40 mg | Freq: Two times a day (BID) | ORAL | Status: DC
Start: 2023-01-08 — End: 2023-01-09

## 2023-01-08 MED ORDER — PREGABALIN 150 MG CAPSULE
150 mg | Freq: Two times a day (BID) | ORAL | Status: DC
Start: 2023-01-08 — End: 2023-01-12
  Administered 2023-01-09 – 2023-01-11 (×6): 150 mg via ORAL

## 2023-01-08 MED ORDER — APIXABAN 5 MG TABLET
5 mg | Freq: Two times a day (BID) | ORAL | Status: DC
Start: 2023-01-08 — End: 2023-01-12
  Administered 2023-01-10 – 2023-01-11 (×4): 5 mg via ORAL

## 2023-01-08 MED ORDER — ACETAMINOPHEN 325 MG TABLET
325 mg | Freq: Three times a day (TID) | ORAL | Status: DC | PRN
Start: 2023-01-08 — End: 2023-01-12
  Administered 2023-01-09: 17:00:00 325 mg via ORAL

## 2023-01-08 MED ORDER — MELATONIN 3 MG TABLET
3 mg | Freq: Every evening | ORAL | Status: DC | PRN
Start: 2023-01-08 — End: 2023-01-12

## 2023-01-08 MED ORDER — HYDROMORPHONE 1 MG/ML INJECTION SYRINGE
1 mg/mL | INTRAVENOUS | Status: DC | PRN
Start: 2023-01-08 — End: 2023-01-09
  Administered 2023-01-09 (×2): 1 mL via INTRAVENOUS

## 2023-01-08 MED ORDER — NALOXONE 0.4 MG/ML INJECTION SOLUTION
0.4 mg/mL | INTRAVENOUS | Status: DC | PRN
Start: 2023-01-08 — End: 2023-01-12

## 2023-01-08 MED ORDER — LACTATED RINGERS INTRAVENOUS SOLUTION
INTRAVENOUS | Status: DC
Start: 2023-01-08 — End: 2023-01-08

## 2023-01-08 MED ORDER — HYDROMORPHONE 2 MG TABLET
2 mg | Freq: Once | ORAL | Status: CP
Start: 2023-01-08 — End: ?
  Administered 2023-01-08: 23:00:00 2 mg via ORAL

## 2023-01-08 MED ORDER — LIDOCAINE 4 % TOPICAL PATCH
4 % | TRANSDERMAL | Status: DC
Start: 2023-01-08 — End: 2023-01-12

## 2023-01-08 MED ORDER — TIOTROPIUM BROMIDE 2.5 MCG/ACTUATION MIST FOR INHALATION
2.5 mcg/actuation | Freq: Every day | RESPIRATORY_TRACT | Status: DC
Start: 2023-01-08 — End: 2023-01-08

## 2023-01-08 MED ORDER — SODIUM CHLORIDE 0.9 % (FLUSH) INJECTION SYRINGE
0.9 % | Freq: Three times a day (TID) | INTRAVENOUS | Status: DC
Start: 2023-01-08 — End: 2023-01-12
  Administered 2023-01-09 (×2): 0.9 mL via INTRAVENOUS

## 2023-01-08 MED ORDER — LACTATED RINGERS IV BOLUS FROM BAG
INTRAVENOUS | Status: CP
Start: 2023-01-08 — End: ?
  Administered 2023-01-08: 21:00:00 1000.000 mL via INTRAVENOUS
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
## 2023-01-08 NOTE — Other
GI Brief EGD NotePlease see procedures section for complete reportFindings:   The Z-line was slightly irregular.   The exam of the esophagus was otherwise normal.   Diffuse moderately erythematous mucosa with a few small erosions was   found throughout the entire examined stomach.   The cardia and gastric fundus were normal on retroflexion.   Patchy mildly erythematous mucosa was found in the duodenal bulb.   The exam of the duodenum was otherwise normal.                                         Impression:      - Z-line slightly irregular.            - Otherwise normal esophagus.            - Moderately erythematous mucosa with a few erosions            throughout the stomach.            - Erythematous duodenopathy. Recommendation:    PPI daily            Avoid NSAIDs            Diet as tolerated            Check stool for H. pylori Ag            Trend CBC Discussed with Dr. Idell Pickles
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
## 2023-01-08 NOTE — Plan of Care
Plan of Care Overview/ Patient Status    Case Management Screening and Evaluation  Flowsheet Row Most Recent Value Case Management Screening: Chart review completed. If YES to any question below then proceed to CM Eval/Plan  Is there a change in their cognitive function No Do you anticipate a change in this patient's physicial function that will effect discharge needs? No Has there been a readmission within the last 30 days No Were there services prior to admission ( Examples: Assisted Living, HD, Homecare, Extended Care Facility, Methadone, SNF, Outpatient Infusion Center) No Negative/Positive Screen Negative Screening: Case Management department will follow patient's progress and discuss plan of care with treatment team. Case Manager Attestation  I have reviewed the medical record and completed the above screen. CM staff will follow patient's progress and discuss the plan of care with the Treatment Team. Yes   , BSN, RN-BCNurse Case ManagerGreenwich Hospital[address], [place], [place] [address]Cell: [phone]: [phone]

## 2023-01-08 NOTE — Progress Notes
Pt sent back to Surg-A, pt awake alert and oriented x 4, breathing comfortably on 4L NC, denies pain/discomfort. Abdomen soft, no crepitus, BP ranges 70-80's asymptomatic, Dr Farris Has made aware and stated will contact attending, call bell bell in reached, bed alarm on, report given to RN Hailey at bedside.

## 2023-01-08 NOTE — ED Notes
11:25 AM Confirmed on RTM with tele tech.

## 2023-01-08 NOTE — Anesthesia Post-Procedure Evaluation
Anesthesia Post-op NotePatient: Henry Riggs J HelgesonProcedure(s):  Procedure(s) (LRB):UPPER GI ENDOSCOPY; DX, W/WO SPECIMEN COLLECTION, BRUSHING/WASHING (SEP PROC) (N/A) Last Vitals:  I have reviewed the post-operative vital signs.POSTOP EVALUATION:      Patient Recovery Location:  Phase II     Vital Signs Status:  Stable     Patient Participation:  Patient participated     Mental Status:  Awake     Respiratory Status:  Acceptable     Airway Patency:  Patent     Cardiovascular/Hydration Status:  Stable     Pain Management:  Satisfactory to patient     Nausea/Vomiting Status:  Satisfactory to patientThere were no known notable events for this encounter.

## 2023-01-08 NOTE — Utilization Review (ED)
UM Status: UR/MD agree on OBS status Discussed with admitting provider, Dr. Francella Solian, would like patient to start as obsHypoxic to 83% on RA, now 94% on RA, febrile, tachycardic

## 2023-01-08 NOTE — Other
Post Anesthesia Transfer of Care NotePatient: Henry Venne HelgesonProcedure(s) Performed: Procedure(s) (LRB):UPPER GI ENDOSCOPY; DX, W/WO SPECIMEN COLLECTION, BRUSHING/WASHING (SEP PROC) (N/A) Last Vitals:  vPOSTOP HANDOFF SMARTBLOCKIntra-operative Intake & Output and Antibiotics as per Anesthesia record and discussed with the RN.I have reviewed the clinical status of this patient in the recovery area and is deemed stable for transfer to the next health care provider.  The electronic chart has the recovery area vitals filed in the EPIC system.  I have reviewed them. Temperature is normal range

## 2023-01-08 NOTE — Utilization Review (ED)
UM Status: UR Reviewed-Continue Observation Services

## 2023-01-08 NOTE — Other
Operative Diagnosis:Pre-op:   Bleeding [R58] Patient Coded Diagnosis   Pre-op diagnosis: Bleeding  Post-op diagnosis: Bleeding  Patient Diagnosis   Pre-op diagnosis: Bleeding [R58]  Post-op diagnosis:     Post-op diagnosis:   * Bleeding [R58]Operative Procedure(s) :Procedure(s) (LRB):UPPER GI ENDOSCOPY; DX, W/WO SPECIMEN COLLECTION, BRUSHING/WASHING (SEP PROC) (N/A)Post-op Procedure & Diagnosis ConfirmationPost-op Diagnosis: Post-op Diagnosis confirmed (no changes)Post-op Procedure: Post-op Procedure confirmed (no changes)

## 2023-01-08 NOTE — ACP (Advance Care Planning)
Dr. Idell Pickles and I spoke with [name] regarding his wishes for advanced care planning. Discussion of resuscitation measures included CPR, defibrillation and intubation.They have elected a Full Code status.Order has been placed to reflect this decision.Chong Sicilian [id number]: [phone]: [id number]
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
## 2023-01-08 NOTE — ED Notes
SBAR HandoffHelgeson, Kaheem Venhaus HelgesonSituation:	Admitting Diagnosis: The primary encounter diagnosis was Nausea and vomiting, unspecified vomiting type. Diagnoses of Hypoxia, Sinus tachycardia, and Fever, unspecified fever cause were also pertinent to this visit.Background:  Chief Complaint Patient presents with  Abdominal Pain   Reports he was seen in [facility] yesterday for dark colored emesis, complained of abd and back pain. On anticoags for hx of PE, H/H stable on yesterday's labs. States he has not vomited since he went home yesterday. Arrived in ED with low grade temp of 100.5, tachy 110's and oxygen sats of 83% on RA. Has been using Ibuprofen for frequent HA's. Current Code Status   Prior  Assessment:Vitals:  01/08/23 0638 01/08/23 0806 BP: 115/61 (!) 110/57 Pulse: (!) 116 (!) 103 Resp: 18 19 Temp: (!) 100.5 ?F (38.1 ?C) 98 ?F (36.7 ?C) TempSrc: Oral Oral SpO2: (!) 85% 94% Weight: 126 kg  Access Lines   Active PICC Line / PIV Line / Intraosseous Line / ART Line / Line / CVC Line / Implanted Port   Name Placement date Placement time Site Days  Periph IV 01/08/23 0707 basilic (5th finger side), right over-the-needle catheter system 20 gauge 1 in length 01/08/23  0707  basilic (5th finger side), right  less than 1  Periph IV 01/08/23 0708 median vein (palm side), left over-the-needle catheter system 20 gauge 1 in length 01/08/23  0708  median vein (palm side), left  less than 1    Trauma:  [x]  N/A      []  Modified     []  Full    []  Urine Toxicology obtained   ( note: utox must be obtained within 24hours of admission) Mental status:  [x]  A/O x4  []  Confused  []  Uncooperative/Agitated  []  Lethargic 		[]  Special Needs _____Sitter ordered/needed? [] Yes    [x]  No Suicide Precautions?  [] Yes    [x]  No    [place] Suicide Risk Level: low riskFunctional Status: [x] Independent   []  Needs assistanceDysphagia screen : [x]  Not Performed        ED Fall Risk: Yes Is patient on oxygen: [x] No    []  Yes  Device (Oxygen Therapy): room air  RTM : [] Yes    [x]  No	 []  With POx   []  Tele confirmedBowel/Bladder: [x] Continent  []  Incontinent  []  Catheter []  External Device GI Signs/Symptoms: abdominal discomfortSkin Alteration: []  Pressure Injury []  Wound []  None [x]  Skin Not Assessed  Pain Score:: 4Valuables Noted:  []  Dentures   []  Glasses  []  Hearing Aids [x]  None  []  Not Reviewed  Recommendations:		Pending labs/meds/blood products: [x] No    []  Yes ___	Pending imaging/procedures : [x] No    []  Yes___	Brief plan/summary: plan for admitMedications ordered and administered in the Emergency Department:	Medications sodium chloride 0.9 % (new bag) bolus 1,000 mL (1,000 mLs Intravenous New Bag 01/08/23 0713) pantoprazole (PROTONIX) 40 mg in sodium chloride 0.9% PF 10 mL (4 mg/mL) (40 mg IV Push Given 01/08/23 0829) Labs ordered and resulted in the Emergency Department. 	Abnormal Labs Reviewed PT/INR AND PTT ([facility]) - Abnormal; Notable for the following components:     Result Value  PTT 33.3 (*)   All other components within normal limits CBC WITH AUTO DIFFERENTIAL - Abnormal; Notable for the following components:  WBC 12.4 (*)   Hemoglobin 13.1 (*)   RDW-CV 15.6 (*)   Neutrophils 81.6 (*)   Lymphocytes 10.2 (*)   ANC(Abs Neutrophil Count) 10.08 (*)   All other components within normal limits COMPREHENSIVE METABOLIC PANEL - Abnormal;  Notable for the following components:  Glucose 152 (*)   Calcium 8.3 (*)   Albumin 3.1 (*)   Alkaline Phosphatase 137 (*)   All other components within normal limits POCT GLUCOSE - Abnormal; Notable for the following components:  Glucose, Meter 126 (*)   All other components within normal limits Radiology studies done in ER: XR Chest PA or AP - portable Final Result    Stable exam. No evidence for pneumonia.  Reported and signed by:  Antonieta Iba, MD   Katrine Coho, RN

## 2023-01-09 ENCOUNTER — Inpatient Hospital Stay: Admit: 2023-01-09 | Payer: PRIVATE HEALTH INSURANCE

## 2023-01-09 DIAGNOSIS — K920 Hematemesis: Secondary | ICD-10-CM

## 2023-01-09 LAB — CBC WITHOUT DIFFERENTIAL
BKR WAM ANALYZER ANC: 6.64 x 1000/ÂµL (ref 2.00–7.60)
BKR WAM HEMATOCRIT (2 DEC): 42.6 % (ref 38.50–50.00)
BKR WAM HEMOGLOBIN: 13 g/dL — ABNORMAL LOW (ref 13.2–17.1)
BKR WAM MCH (PG): 27.9 pg (ref 27.0–33.0)
BKR WAM MCHC: 30.5 g/dL — ABNORMAL LOW (ref 31.0–36.0)
BKR WAM MCV: 91.4 fL (ref 80.0–100.0)
BKR WAM MPV: 10.6 fL (ref 8.0–12.0)
BKR WAM PLATELETS: 169 x1000/ÂµL (ref 150–420)
BKR WAM RDW-CV: 15.4 % — ABNORMAL HIGH (ref 11.0–15.0)
BKR WAM RED BLOOD CELL COUNT.: 4.66 M/ÂµL (ref 4.00–6.00)
BKR WAM WHITE BLOOD CELL COUNT: 8.2 x1000/ÂµL (ref 4.0–11.0)

## 2023-01-09 LAB — BASIC METABOLIC PANEL
BKR ANION GAP: 4 — ABNORMAL LOW (ref 5–18)
BKR BLOOD UREA NITROGEN: 8 mg/dL (ref 8–25)
BKR BUN / CREAT RATIO: 9.3 (ref 8.0–25.0)
BKR CALCIUM: 8.5 mg/dL (ref 8.4–10.3)
BKR CHLORIDE: 107 mmol/L (ref 95–115)
BKR CO2: 28 mmol/L (ref 21–32)
BKR CREATININE: 0.86 mg/dL (ref 0.50–1.30)
BKR EGFR, CREATININE (CKD-EPI 2021): >60 mL/min/{1.73_m2} (ref >=60)
BKR GLUCOSE: 134 mg/dL — ABNORMAL HIGH (ref 70–100)
BKR OSMOLALITY CALCULATION: 278 mosm/kg (ref 275–295)
BKR POTASSIUM: 3.6 mmol/L (ref 3.5–5.1)
BKR SODIUM: 139 mmol/L (ref 136–145)

## 2023-01-09 LAB — HEPATIC FUNCTION PANEL
BKR A/G RATIO: 0.8
BKR ALANINE AMINOTRANSFERASE (ALT): 24 U/L (ref 12–78)
BKR ALBUMIN: 3.1 g/dL — ABNORMAL LOW (ref 3.4–5.0)
BKR ALKALINE PHOSPHATASE: 131 U/L (ref 20–135)
BKR ASPARTATE AMINOTRANSFERASE (AST): 18 U/L (ref 5–37)
BKR AST/ALT RATIO: 0.8
BKR BILIRUBIN DIRECT: 0.1 mg/dL (ref 0.0–0.3)
BKR BILIRUBIN TOTAL: 0.5 mg/dL (ref 0.0–1.0)
BKR BILIRUBIN, INDIRECT: 0.4 mg/dL
BKR GLOBULIN: 4 g/dL
BKR PROTEIN TOTAL: 7.1 g/dL (ref 6.4–8.2)

## 2023-01-09 LAB — PROCALCITONIN     (BH GH LMW Q YH): BKR PROCALCITONIN: <0.04 ng/mL

## 2023-01-09 LAB — LACTIC ACID, PLASMA (REFLEX 2H REPEAT): BKR LACTATE: 1 mmol/L (ref 0.4–2.0)

## 2023-01-09 LAB — C-REACTIVE PROTEIN     (CRP): BKR C-REACTIVE PROTEIN: 8.1 mg/dL — ABNORMAL HIGH (ref 0.0–1.0)

## 2023-01-09 MED ORDER — AMPICILLIN-SULBACTAM (UNASYN) 1.5GM MBP
Freq: Four times a day (QID) | INTRAVENOUS | Status: DC
Start: 2023-01-09 — End: 2023-01-12
  Administered 2023-01-09 – 2023-01-11 (×9): 50.000 mL/h via INTRAVENOUS

## 2023-01-09 MED ORDER — PANTOPRAZOLE IV PUSH 40 MG VIAL & NS (ADULTS)
Freq: Two times a day (BID) | INTRAVENOUS | Status: DC
Start: 2023-01-09 — End: 2023-01-09

## 2023-01-09 MED ORDER — HYDROMORPHONE 1 MG/ML INJECTION SYRINGE
1 mg/mL | Freq: Four times a day (QID) | INTRAVENOUS | Status: DC | PRN
Start: 2023-01-09 — End: 2023-01-12
  Administered 2023-01-09: 16:00:00 1 mL via INTRAVENOUS

## 2023-01-09 MED ORDER — HYDROMORPHONE 2 MG/ML INJECTION SOLUTION
2 mg/mL | Freq: Four times a day (QID) | INTRAVENOUS | Status: DC | PRN
Start: 2023-01-09 — End: 2023-01-12
  Administered 2023-01-10 – 2023-01-11 (×6): 2 mL via INTRAVENOUS

## 2023-01-09 MED ORDER — SENNOSIDES 8.6 MG TABLET
8.6 mg | Freq: Every day | ORAL | Status: DC
Start: 2023-01-09 — End: 2023-01-10
  Administered 2023-01-10: 14:00:00 8.6 mg via ORAL

## 2023-01-09 MED ORDER — METHYLPREDNISOLONE 40 MG/ML INJECTION
INTRAVENOUS | Status: DC
Start: 2023-01-09 — End: 2023-01-12
  Administered 2023-01-09 – 2023-01-11 (×3): 1.000 mL via INTRAVENOUS

## 2023-01-09 MED ORDER — LACTATED RINGERS IV BOLUS (ADULT SEPSIS FLUID BOLUS)
Freq: Once | INTRAVENOUS | Status: CP
Start: 2023-01-09 — End: ?
  Administered 2023-01-09: 17:00:00 2500.000 mL/h via INTRAVENOUS

## 2023-01-09 MED ORDER — DOXYCYCLINE TAB/CAP 100 MG (WRAPPED E-RX)
100 mg | Freq: Two times a day (BID) | ORAL | Status: DC
Start: 2023-01-09 — End: 2023-01-12
  Administered 2023-01-09 – 2023-01-11 (×5): 100 mg via ORAL

## 2023-01-09 MED ORDER — ENOXAPARIN 190 MG INJECTION MIXTURE (150 MG PLUS 40 MG)
SUBCUTANEOUS | Status: DC
Start: 2023-01-09 — End: 2023-01-09

## 2023-01-09 MED ORDER — FAMOTIDINE 20 MG TABLET
20 mg | Freq: Two times a day (BID) | ORAL | Status: DC
Start: 2023-01-09 — End: 2023-01-12
  Administered 2023-01-10 – 2023-01-11 (×4): 20 mg via ORAL

## 2023-01-09 NOTE — Utilization Review (ED)
UM Status: Managed Medicare IP Presented with coffee ground emesis, mid epigastric pain, acute hypoxia, on RA, status post EGD, GI following, dit ordered, swallow evaluation pending hemoglobin and hematocrit stableSpoke with Dr Tyson Babinski, patients status updated to inpatient for continued work-up and treatment, febrile, [place] chest ordered, blood cultures pendingChristine Perlie Gold, RN BSNUtilization Review

## 2023-01-09 NOTE — Progress Notes
Medicine Inpatient Progress NoteAttending Provider: Rinaldo Ratel, [facility]: abdominal pain, nausea and vomiting, acute hypoxic respiratory failure. Pneumonia. Subjective: The patient was seen multiple times throughout the day.  He has been febrile in the morning.  Wheezing and complaining of shortness a breath.  Feeling nauseous.  Also complains of back pain which is chronic.  Denies chest pain.  Noncompliant with oxygen, frequently desaturated to 74% when he is off oxygen.  Noncompliant with medications, refusing multiple medications.  Tolerating diet.  Refused ProtonixMeds: Scheduled Meds:Current Facility-Administered Medications Medication Dose Route Frequency Provider Last Rate Last Admin ? ampicillin-sulbactam (UNASYN) 1.5 g in sodium chloride 0.9% 50 mL (mini-bag plus)  1.5 g Intravenous Q6H Rinaldo Ratel, MD 200 mL/hr at 01/09/23 1540 1.5 g at 01/09/23 1540 ? apixaban (ELIQUIS) tablet 5 mg  5 mg Oral BID Guerriero, Miranda, DO     ? doxycycline TAB/CAP 100 mg  100 mg Oral Q12H Rinaldo Ratel, MD   100 mg at 01/09/23 1247 ? famotidine (PEPCID) tablet 20 mg  20 mg Oral BID Rinaldo Ratel, MD     ? lidocaine 4 % topical patch 1 patch  1 patch Transdermal Q24H Guerriero, Miranda, DO     ? methylPREDNISolone (Solu-MEDROL) 40 mg in water for injection, sterile injection  40 mg Intravenous Daily Rinaldo Ratel, MD   40 mg at 01/09/23 1223 ? Potassium chloride SA (K-DUR,KLOR-CON M) 24 hr tablet 10 mEq  10 mEq Oral Daily Guerriero, Miranda, DO     ? pregabalin (LYRICA) capsule 150 mg  150 mg Oral BID Shepherd-Hall, Janiline En, PA   150 mg at 01/09/23 1610 ? rosuvastatin (CRESTOR) tablet 40 mg  40 mg Oral Daily Guerriero, Miranda, DO   40 mg at 01/09/23 9604 ? [START ON 01/10/2023] senna (SENOKOT) tablet 8.6 mg  1 tablet Oral Daily Rinaldo Ratel, MD     ? sodium chloride 0.9 % flush 3 mL  3 mL IV Push Q8H Guerriero, Miranda, DO   3 mL at 01/09/23 0304 Continuous Infusions:PRN Meds:acetaminophen, ALPRAZolam, HYDROmorphone **OR** HYDROmorphone, melatonin, naloxone, sodium chlorideObjective: Vitals:Temp:  [97.7 ?F (36.5 ?C)-102.8 ?F (39.3 ?C)] 98.9 ?F (37.2 ?C)Pulse:  [81-130] 96Resp:  [18-24] 20BP: (101-148)/(57-74) 122/64SpO2:  [89 %-95 %] 93 %Device (Oxygen Therapy): nasal cannulaO2 Flow (L/min):  [4] 4  I/O's:No intake or output data in the 24 hours ending 01/09/23 1626 Physical Exam:General/consitutional - Appropriate to age, obese 37.67, NADHEENT - NCAT, CV- S1S2 regular rhythm, tachycardic between 100 to 120Pulmonary  -diminished breath sounds with diffuse wheezing.GI - Abdomen soft NT NDVascular - mild distal leg bilateral edema, pulse 2+ DP/PTMusculoskeletal no reproducible tenderness Derm: normal colorHem/once no palpable lymph nodes, no petechia Neuro: AOx3, normal muscle strength on all extremities, no sensitivity deficit. Labs: Recent Labs Lab 02/10/240759 02/10/240816 02/11/240656 02/11/240709 02/11/241509 02/12/240540 WBC 12.1*  --   --  12.4* 11.4* 8.2 HGB 15.2  --   --  13.1* 12.1* 13.0* HCT 46.40   < >  --  40.80 38.80 42.60 PLT 225  --   --  221 195 169 NEUTROPHILS 81.7*  --   --  81.6*  --   --  NA 143  --  140  --   --  139 K 4.2   < > 3.5  --   --  3.6 CL 106  --  108  --   --  107 CO2 24  --  27  --   --  28 BUN 11  --  10  --   --  8 CREATININE 0.77   < > 0.98  --   --  0.86 ANIONGAP 13  --  5  --   --  4*  < > = values in this interval not displayed.  Recent Labs Lab 02/10/240759 02/11/240656 02/12/240540 ALT 32 28 24 AST 43* 16 18 ALKPHOS 170* 137* 131 BILITOT 0.5 0.7 0.5 BILIDIR  --   --  0.1 PROT 7.3 7.1 7.1 ALBUMIN 4.2 3.1* 3.1* CALCIUM 9.0 8.3* 8.5 MG  --  2.1  --  PTT  --  33.3*  --  LABPROT 11.3 11.9  --  INR 1.02 1.13  --   Recent Labs Lab 02/10/240759 02/10/240816 02/11/240656 02/11/240746 02/12/240540 GLU 111* 108* 152* 126* 134* Micro:COVID 192/09/2023 negativeRespiratory virus PCR panel negative 2/11/2024Urine culture Urine cultureOrder: [id number] - Reflex for Order [id number]	Status: Preliminary result  ?	Visible to patient: No (not released)  ?	Next appt: 01/26/2023 at 10:00 AM in Lab Data processing manager)  ?Specimen Information: Urine 	0 Result NotesUrine Culture, Routine 50,000-99,000 CFU/mL Enterococcus ?species?Abnormal?  Enterococcus species are intrinsically resistant to cephalosporins, clindamycin, and trimethoprim-sulfamethoxazole.  Resulting Agency: [facility]   Specimen Collected: 01/08/23 ?6:56 AM Last Resulted: 01/09/23 ?7:26 AM  	 ?Lab Flowsheet ?	 Order Details ?	 View Encounter ?	   Blood culture 2/11 and 2/12 negative Sputum culture Pending collection Diagnostics:I independently reviewed the images or tracings within the last 48 hrs. Significant abnormals are below.CARDIAC MISC.? RESULT SCAN Final Result  XR Chest PA or AP - portable Final Result    Stable exam. No evidence for pneumonia.  Reported and signed by:  Antonieta Iba, MD   CARDIAC MISC.? RESULT SCAN Final Result  [place] Chest wo IV Contrast    (Results Pending) Results for orders placed or performed during the hospital encounter of 01/08/23 EKG Result Value Ref Range  Heart Rate 118 bpm  QRS Interval 88 ms  QT Interval 318 ms  QTC Interval 445 ms  P Axis 29 deg  QRS Axis 9 deg  T Wave Axis 7 deg  P-R Interval 162 msec  SEVERITY Abnormal ECG severity Patient Active Problem List Diagnosis ? Hematuria ? Urinary retention ? Neoplasm of uncertain behavior of skin ? 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 Osteoarthritis ? Left knee pain ? Rotator cuff injury ? Hx of cervical spine surgery ? Pre-op exam ? Acute pain of right shoulder ? Tobacco use disorder ? Psychic factors associated with diseases classified elsewhere ? Left shoulder pain ? Arthritis of shoulder ? Cervical spine arthritis ? Rotator cuff arthropathy, right ? Essential hypertension ? Abdominal pain ? COPD (chronic obstructive pulmonary disease) (HC Code) ? Dupuytren's contracture of both hands ? Hypercholesterolemia ? Midline low back pain with sciatica ? Orchitis ? Urinary tract infection associated with catheterization of urinary tract (HC Code) (HC CODE) (HC Code) ? Sepsis (HC Code) ? Head injury, initial encounter ? Dizziness ? Urinary tract infection without hematuria, site unspecified ? Spondylosis of cervical region without myelopathy or radiculopathy ? Spondylosis of lumbar region without myelopathy or radiculopathy ? Facet arthropathy ? Impingement syndrome of right shoulder ? Spinal stenosis in cervical region ? Spinal stenosis of lumbar region ? S/P cervical spinal fusion ? Sacroiliac joint dysfunction of both sides ? COPD exacerbation (HC Code) ? Pneumonia due to infectious organism ? Hypokalemia ? Volume depletion ? Pneumonia of left lung due to infectious organism, unspecified part of lung ? Shortness of breath ? Pulmonary arterial thrombosis (HC Code) (HC CODE) (HC Code) ? Chronic pain ?  Fatty liver ? Dupuytren's contracture of right hand ? Urinary tract infection with hematuria, site unspecified ? Pulmonary embolism (HC Code) ? Hypoxia ? Chronic pulmonary aspiration ? History of choking ? Food impaction of esophagus, initial encounter ? Coffee ground emesis ? Nausea and vomiting, unspecified vomiting type ? Acute respiratory failure with hypoxia (HC Code) (HC CODE) (HC Code) Assessment & Plan: 72 y.o. 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  Patient had EGD which revealed small erosions in the stomach with erythematous duodenopathy.  Hospital course complicated by acute hypoxic respiratory failure, fever, UTI and possible pneumonia.  Patient also was noticed to have some dysphagia but refused speech and swallow eval.  Patient was noncompliant with medications in hospital, refusing certain medications.PlanAcute hypoxic respiratory failurePneumoniaUTI, EnterococcusCOPD exacerbationRepeat blood cultures and lactic acid this morning while he was febrile, no sepsisReceived IV fluids for sepsis bolus until results of lactic acid was reportedUnasyn which covers possible aspiration pneumonia and Enterococcus UTIDoxycycline for possible atypical pneumoniaSolu-Medrol 40 milligrams IV dailySputum if able to produceMonitor CRP dailyOxygen supplementation, wean as toleratedTylenol as needed for feverPatient reported adverse reaction to Spiriva, refused DuoNebs, Spiriva or albuterolHold home Incruse Ellipta secondary to non formularyNausea, vomiting, possible coffee-groundDysphagiaStatus post EGD, mild gastritis and duodenitisPatient refused Protonix, started on famotidineStool H pylori orderedAppreciate GI inputAnti nausea medications as neededPatient refused speech and swallow evalAvoid NSAIDsHistory of PEFull anticoagulation with apixabanCoagulopathy secondary to apixabanPatient refused apixaban this morningStarted on Lovenox but refused again, agreed to take apixaban in a.m. newBack pain,chronicThe patient is on Dilaudid 4 milligrams every 6 hours as needed orally at homeDue to shortage of oral Dilaudid, put Dilaudid 2 milligrams IV every 6 hours for severe pain, 1 milligram for moderate painPatient reported that he is going to have a procedure in a few days but his back pain, advised against the procedure while his actively ill and febrileLyrica 150 mg bidLidocaine patch, patient refusingAnxietyXanax 0.5 milligrams twice daily as neededObstructive sleep apneaPatient refusing CPAPOxygen as neededBPH, chronic urinary retentionCell catheterization with sterile technique## Diet:  Cardiac## Proph:  On full anticoagulation with apixaban## Code:  Full codeSigned:Yasser Hepp MDPager [id number] 12, 20244:26 PMThe above report was partially generated using voice recognition software. It may contain grammatical,syntax and spelling errors. Please contact the hospitalist office for questions or clarification

## 2023-01-09 NOTE — Plan of Care
Plan of Care Overview/ Patient Status    1000 = Review of record, Temp 102.8 this AM - texted UR and asked that they review observation status1315 = Status changed to InpatientCM will follow and assist with DC needs

## 2023-01-09 NOTE — Plan of Care
Plan of Care Overview/ Patient Status    0740 Pt is A&O x 4. 4L NC. Pt refused to keep O2 on. Education on importance provided. RTM with pulse ox maintained & seeing on monitor. No resp. Distress. Denies SOB. Denies chest pain. Infrequent cough. Abd soft non tender. +BS x 4 Quadrants. Tolerating Cardiac diet. Denies N/V. Void without difficulty. OOB stand by assist.  +PP, denies Numbness & tingling. Bed alarm on. Call bell & essentials within reached. Safety maintained. Patient/Family acknowledge understanding of fall prevention education including to call nurse with assistance with ambulation.0845  FS 139/74 HR 113 Temp 102.8 90% on 4L NC. Pt denies chest pain denies SOB or any other symptoms. Tylenol offered for fever Pt refused stated  I don't to destroy my liver  education with no success. Pt refused Eliquis & Protonix. Pt stated takes, Omeprazole. MD aware of all findings. Pt aware of no Omeprazole per MD. Still refused. 1027 Pt refused orthostatic. Education with no success. MD made aware. No further intervention at this time.1610-9604  1mg  Dilaudid IV given for back pain 6/10.Pt assisted to bathroom self cath.Orthostatic BP done,  Documented & sent to MD. Pt Sat 89% on 5L NC. Pt denies SOB & chest pain.Pt report headaches. MD awrae of all findings stated will come see Pt.1118 Pt report pain 4/10.1132 BP 131/69 HR 119. Temp 102.3 Resp. 22. 94% on 5L NC. Reported to MD. 1223 Pt IV Solu-MEDROL administered.1236 Pt report not feeling well. Stated still feels hot & uncomfortable. Vitals done documented. MD made aware at bedside STAT. Pt to be transferred to tele.Writer called pharm regarding lovenox ordered. Pharm stated do not give. She will contact MD. Pt refused protonix IV , Lidocaine. Education with no success.1430 Pt was transferred to Tele accompanied by Clinical research associate . Transport & MD. All belongings collected.Pt was non-compliant on unit. Education with no success. MD was made aware of all findings.Per Pharm Lovenox dose can be given. Tele RN is aware. All findings throughout shift with Pt was reported to MD. Problem: InfectionGoal: Absence of Infection Signs and SymptomsOutcome: Interventions implemented as appropriate Problem: Adult Inpatient Plan of CareGoal: Plan of Care ReviewOutcome: Interventions implemented as appropriateGoal: Patient-Specific Goal (Individualized)Outcome: Interventions implemented as appropriateGoal: Absence of Hospital-Acquired Illness or InjuryOutcome: Interventions implemented as appropriateGoal: Optimal Comfort and WellbeingOutcome: Interventions implemented as appropriateGoal: Readiness for Transition of CareOutcome: Interventions implemented as appropriate Problem: Fall Injury RiskGoal: Absence of Fall and Fall-Related InjuryOutcome: Interventions implemented as appropriate Problem: InfectionGoal: Absence of Infection Signs and SymptomsOutcome: Interventions implemented as appropriate Problem: Adult Inpatient Plan of CareGoal: Plan of Care ReviewOutcome: Interventions implemented as appropriateGoal: Patient-Specific Goal (Individualized)Outcome: Interventions implemented as appropriateGoal: Absence of Hospital-Acquired Illness or InjuryOutcome: Interventions implemented as appropriateGoal: Optimal Comfort and WellbeingOutcome: Interventions implemented as appropriateGoal: Readiness for Transition of CareOutcome: Interventions implemented as appropriate Problem: Fall Injury RiskGoal: Absence of Fall and Fall-Related InjuryOutcome: Interventions implemented as appropriate

## 2023-01-09 NOTE — Plan of Care
Adult Speech and Language PathologySwallow Evaluation2/12/2024Patient Name:  Henry Goldsberry HelgesonMR#:  [id number] Date of Birth:  [birth year]Therapist:  Scotty Court, SLP SLP IP Adult Eval Swallow/Voice - 01/09/23 0933    Date of Visit/Evaluation  Date of Visit / Treatment 01/09/23   Date Treatment Plan Initiated/Reviewed 01/09/23   Start Time 0915   End Time 0933   Total Treatment Time 18    General Information  Subjective Patient seen for initial CSE for concerns of aspiration/aspiration risk   Referring Physician  (SLP) Chong Sicilian, DO   Medical Diagnosis Nausea and vomiting   General Observations Pt received at bedside. Pt reporting stomach pain. Pt not wanting to trial PO or participate in evaluation. Therefore, evaluation greatly limited.   Pertinent History of Current Problem 72 year old man with PMH COPD, pulmonary fibrosis, PE, chronic back pain s/p cervical spinal fusion, prior food impaction, NASH who presented to ER with coffee-ground emesis. Pt reports he has had dysphagia for years since his cervical spinal surgery.  Has food get stuck intermittently.     EDG 2/11 z-line slightly irregular, otherwise normal esophagus.  Prior EGD 07/2022 showed Food bolus in upper third of esophagus at upper esophageal sphincter with complete obstruction of lumen  CXR, XR, and Viola of abdomen was negative.WBCs, temperature stable. Pt on NC 4L.   Language(s) spoken at home English   Primary Concern Concern for apsiration given hypoxia   Prior Level of Functioning Pt with previous hx with speech therapy completing FEES 05/25/2020 given pt reporting of PO getting stuck. FEES reported stated FEES demonstrating mild pharyngo-esophageal dysphagia. Timing and efficiency of laryngeal vestibule closure (LVC) was reduced. This mistiming of LVC is judged to be c/w underlying respiratory illness/COPD. Laryngeal penetration is visualized during serial swallows, without tracheal aspiration (PAS scale score 5/8). No spontaneous 'throat clear' or cough in response to material dipping to level of vocal cords, suggestive of reduced laryngeal sensitivity. FEES recommended continue Regular solids with thin liquids.     Pt later had MBS completed 05/26/20 showing mild pharyngeal phase dysphagia and improvement in airway protection PAS 2/8. Pharyngeal wall appearing thickened. There is bony prominence of cervical vertebrae. Esophageal sweep: barium transit timely, barium pill cleared, no retroflow    Pain/Comfort  Location #1 - PreTreatment Rating (Numbers Scale) 9/10   Posttreatment Rating (Numbers Scale) 9/10   Pain Comment (Pre/Post Treatment Pain) c/o stomach pain. Medical team aware    Oral/Motor Function  Labial Function WFL - Within Functional Limits   Lingual Function WFL - Within Functional Limits   Facial  WFL - Within Functional Limits   Dentition Dentures top   Overall Oral Motor Function Comments Voice rough/hoarse. Adequate articulatory precision    Three Ounce Water Swallow Challenge  Overall Three Ounce Water Swallow Challenge Comments pt deferred particaption    Clinical / Bedside Swallow Evaluation  Type of Study Initial   Reason for Study aspiration risk;concern for aspiration risk   Patients Current Diet Regular consistency diet;Thin liquids   Patient Position Bed;Upright   Patient Respiratory Status during evaluation - O2 Delivery Nasal Cannula        O2 Rate 4L        Baseline Coughing Present        Baseline Throat Clearing Present   Pre-Oral Skills / Observations Adequate   Thin Liquid yes        Delivery Cup;Self-Fed        Volume x3oz        Oral Phase  Adequate        Overt Signs / Symptoms of Aspiration / Difficulty None   Soft Solid yes        Delivery Spoon;Self-fed        Volume x2        Oral Phase Adequate        Overt Signs / Symptoms of Aspiration / Difficulty None Regular Solid yes        Delivery By hand;Self-fed        Volume x3        Oral Phase Adequate        Overt Signs / Symptoms of Aspiration / Difficulty Other        Other Comments / Observations severe delay coughing about 20 seconds later with pt reporting chest tightness and PO being stuck   Clinical Impression --   Unable to fully r/o pharyngeal dysphagia  Predictors of Apsiration Pneumonia COPD;Hx of dysphagia or reflux   Hx of GI dysfunction  Patient should be NPO No   Diet Recommendations Regular solids;Thin liquids   Medication Administration --   as tolerated  Recommendations Eat slowly;Sit upright with eating and drinking;Small sips / bites   Patients feeding ability Patient is an independent feeder   Oral Care Recommendations 3x/day;Mechanical brushing with toothbrush;Remove dentures in evening   Discontinue PO if patient experiences: Increased fatigue   Other (Comments) Pt seen today for this clinical swallow evaluation iso recent need for supplemental oxygen. Pt with the following dysphagia risk factors including: COPD, hx of dysphagia, and hx of GI dysfunction.     D/t limitation of assessment given pt's participation, clinician unable to fully r/o pharyngeal dysphagia. Oral phase unremarkable. Pharyngeal phase with no immediate s/sx of pharyngeal compromise noted, however, pt with delay coughing and throat tightness with solids which is more consistent with esophageal dysfunction vs oropharyngeal.     Given that pt's chest imaging was negative for aspiration and WBCs are stable recommend continuing regular solids with thin liquids. Pt recent hypoxia could be related to possible aspiration of emesis.     Pt could benefit from an instrumental swallow assessment to r/o prandial aspiration and impact of esophageal dysphagia on oral pharyngeal swallow. However at this point in time, pt refusing further assessment with speech therapy.      Please following general aspiration precautions and provide stringent oral care to prevent build-up/aspiration of oral pathogens. SLP to f/u as indicated based on pt's compliance.    Speech Therapy Prognosis  Services Skilled SLP services to address the above deficits   Prognosis Guarded   Prognosis Considerations Participation level    Recommendations  SLP Therapy Frequency 3x per week   What day of week is next treatment expected? Wednesday   Recommendations discussed with: nurse;physician;patient    SLP Recommendations for Inpatient Admission  Swallow Recommendations 1. Regular solids and thin liquids  2. Aspiration precautions  3. Frequent oral care  4. SLP to f/u for further assessment pending pt's participation     Plan of Care Overview/ Patient Status    Problem: Speech Language Pathology GoalsGoal: Dysphagia Goals, SLPDescription: Long-Term GoalsPt will consume a regular diet and thins liquids without evidence of dysphagia related medical decline Short-Term GoalsPt will demonstrate adequate medical stability, alertness, and acceptance of PO trials for participation in instrumental assessment or 3oz water challenge within 3 SLP sessions.Pt will consume a regular diet and thins liquids without evidence of dysphagia related medical decline for 3-5 daysOutcome: Initial problem identification Shanda Bumps  Kathelene Rumberger MS CCC-SLP  01/09/2023

## 2023-01-09 NOTE — Plan of Care
Problem: InfectionGoal: Absence of Infection Signs and Symptoms2/10/2023 0427 by Rexene Edison, RNOutcome: Interventions implemented as appropriate2/10/2023 0311 by Rexene Edison, RNOutcome: Interventions implemented as appropriate Problem: Adult Inpatient Plan of CareGoal: Absence of Hospital-Acquired Illness or Injury2/10/2023 0427 by Rexene Edison, RNOutcome: Interventions implemented as appropriate2/10/2023 0311 by Rexene Edison, RNOutcome: Interventions implemented as appropriate Problem: Fall Injury RiskGoal: Absence of Fall and Fall-Related Injury2/10/2023 0427 by Rexene Edison, RNOutcome: Interventions implemented as appropriate2/10/2023 0311 by Rexene Edison, RNOutcome: Interventions implemented as appropriate Plan of Care Overview/ Patient Status    1920  pt sitting in bed when received alert & oriented.x 4.rtm w/ oximeter on.0 2 3.5 liters on.no c/o nausea or vomiting.abdomen obese soft non tender to palpation pt states its it his normal.pt states pain on the lower back shoulder & knees,headache.pt had dilaudid 2 mg po at 6 pm as per outgoing nurse.call bell w/ in reach.o 2 3.5 liters on.w/ occasional non productive cough.2058 due med protonix & eliquis brought to pt.pt becomes very upset yelling to writer,states he does not take those meds.writer talked to pt calmly & explained that he don't  have to yell or get upset & will contact md regarding meds that he takes at home.hospitalist Shepherd -Hall,Janiline texted what pt takes at home & pt refused protonix & eliquis.also about pain medication that he takes dilaudid 4 mg po at home.dilaudid changed to iv.Pt oob to the br w/ supervision,voiding qs.2233  dilaudid 1 mg iv given for lower back pain 7-8/10.pt refused bed alarmPt slept at intervals.02 dipped to 84, while sleeping,02 increased to 4 liters.up to 93-94 %.0037 pt woke up oob to the br w/ supervision,requested xanax,0.5 mg given.Slept afterwards.0256 pt oob to the br w/ supervision,requested pain med for back pain,dilaudid 1 mg iv given.Pt slept afterwards.oob to the br w/ standby assist due to pain med.for safety.

## 2023-01-09 NOTE — Plan of Care
Plan of Care Overview/ Patient Status    1150: Rcvd patient from ED. A+Ox4. 4L NC. RTM w/ pulse ox on. HL. Denies N/V. Voids via bathroom. Ambulates independently. B/l lung sounds clear. +CSM. Denies numbness/tingling. Glasses + dentures at the bedside. Patient/Family acknowledge understanding of fall prevention education and video monitoring (no). Oriented to room, essentials in reach.1330: PT taken for endoscopy. 1410: pt back from endoscopy. Settled back into room. Provided sandwich from kitchen + water. 1600: Pt was able to tolerated lunch well, no n/v or abdominal pains. pt finished bolus -- retook BP: 101/59 HR: 82; informed PA.1805: administered 2mg  dilaudid for back pain. No other complaints offered at this time -- essentials in reach.

## 2023-01-10 LAB — COMPREHENSIVE METABOLIC PANEL
BKR A/G RATIO: 0.8
BKR ALANINE AMINOTRANSFERASE (ALT): 23 U/L (ref 12–78)
BKR ALBUMIN: 2.8 g/dL — ABNORMAL LOW (ref 3.4–5.0)
BKR ALKALINE PHOSPHATASE: 113 U/L (ref 20–135)
BKR ANION GAP: 8 (ref 5–18)
BKR ASPARTATE AMINOTRANSFERASE (AST): 18 U/L (ref 5–37)
BKR AST/ALT RATIO: 0.8
BKR BILIRUBIN TOTAL: 0.4 mg/dL (ref 0.0–1.0)
BKR BLOOD UREA NITROGEN: 10 mg/dL (ref 8–25)
BKR BUN / CREAT RATIO: 13.2 % — ABNORMAL LOW (ref 8.0–25.0)
BKR CALCIUM: 8.5 mg/dL (ref 8.4–10.3)
BKR CHLORIDE: 107 mmol/L (ref 95–115)
BKR CO2: 26 mmol/L (ref 21–32)
BKR CREATININE: 0.76 mg/dL (ref 0.50–1.30)
BKR EGFR, CREATININE (CKD-EPI 2021): >60 mL/min/{1.73_m2} (ref >=60–0.30)
BKR GLOBULIN: 3.7 g/dL — ABNORMAL LOW (ref 0.60–3.70)
BKR GLUCOSE: 115 mg/dL — ABNORMAL HIGH (ref 70–100)
BKR OSMOLALITY CALCULATION: 281 mosm/kg (ref 275–295)
BKR POTASSIUM: 3.4 mmol/L — ABNORMAL LOW (ref 3.5–5.1)
BKR PROTEIN TOTAL: 6.5 g/dL (ref 6.4–8.2)
BKR SODIUM: 141 mmol/L (ref 136–145)

## 2023-01-10 LAB — CBC WITH AUTO DIFFERENTIAL
BKR WAM ABSOLUTE IMMATURE GRANULOCYTES.: 0.02 x 1000/ÂµL (ref 0.00–0.30)
BKR WAM ABSOLUTE LYMPHOCYTE COUNT.: 0.56 x 1000/ÂµL — ABNORMAL LOW (ref 0.60–3.70)
BKR WAM ABSOLUTE NRBC (2 DEC): 0 x 1000/ÂµL (ref 0.00–1.00)
BKR WAM ANALYZER ANC: 6.11 x 1000/ÂµL (ref 2.00–7.60)
BKR WAM BASOPHIL ABSOLUTE COUNT.: 0.01 x 1000/ÂµL (ref 0.00–1.00)
BKR WAM BASOPHILS: 0.1 % (ref 0.0–1.4)
BKR WAM EOSINOPHIL ABSOLUTE COUNT.: 0 x 1000/ÂµL (ref 0.00–1.00)
BKR WAM EOSINOPHILS: 0 % (ref 0.0–5.0)
BKR WAM HEMATOCRIT (2 DEC): 35.8 % — ABNORMAL LOW (ref 38.50–50.00)
BKR WAM HEMOGLOBIN: 11.6 g/dL — ABNORMAL LOW (ref 13.2–17.1)
BKR WAM IMMATURE GRANULOCYTES: 0.3 % (ref 0.0–1.0)
BKR WAM LYMPHOCYTES: 7.7 % — ABNORMAL LOW (ref 17.0–50.0)
BKR WAM MCH (PG): 28.8 pg (ref 27.0–33.0)
BKR WAM MCHC: 32.4 g/dL (ref 31.0–36.0)
BKR WAM MCV: 88.8 fL (ref 80.0–100.0)
BKR WAM MONOCYTE ABSOLUTE COUNT.: 0.57 x 1000/ÂµL (ref 0.00–1.00)
BKR WAM MONOCYTES: 7.8 % (ref 4.0–12.0)
BKR WAM MPV: 11 fL (ref 8.0–12.0)
BKR WAM NEUTROPHILS: 84.1 % — ABNORMAL HIGH (ref 39.0–72.0)
BKR WAM NUCLEATED RED BLOOD CELLS: 0 % (ref 0.0–1.0)
BKR WAM PLATELETS: 171 x1000/ÂµL (ref 150–420)
BKR WAM RDW-CV: 15.4 % — ABNORMAL HIGH (ref 11.0–15.0)
BKR WAM RED BLOOD CELL COUNT.: 4.03 M/ÂµL (ref 4.00–6.00)
BKR WAM WHITE BLOOD CELL COUNT: 7.3 x1000/ÂµL (ref 4.0–11.0)

## 2023-01-10 LAB — C-REACTIVE PROTEIN     (CRP): BKR C-REACTIVE PROTEIN: 8.9 mg/dL — ABNORMAL HIGH (ref 0.0–1.0)

## 2023-01-10 LAB — URINE CULTURE

## 2023-01-10 MED ORDER — SENNOSIDES 8.6 MG TABLET
8.6 mg | Freq: Every evening | ORAL | Status: DC
Start: 2023-01-10 — End: 2023-01-12
  Administered 2023-01-11: 02:00:00 8.6 mg via ORAL

## 2023-01-10 MED ORDER — POTASSIUM CHLORIDE ER 20 MEQ TABLET,EXTENDED RELEASE(PART/CRYST)
20 MEQ | Freq: Once | ORAL | Status: CP
Start: 2023-01-10 — End: ?
  Administered 2023-01-10: 19:00:00 20 MEQ via ORAL

## 2023-01-10 MED ORDER — POLYETHYLENE GLYCOL 3350 17 GRAM ORAL POWDER PACKET
17 gram | Freq: Two times a day (BID) | ORAL | Status: DC
Start: 2023-01-10 — End: 2023-01-12
  Administered 2023-01-10 – 2023-01-11 (×3): 17 gram via ORAL

## 2023-01-10 MED ORDER — BISACODYL 5 MG TABLET,DELAYED RELEASE
5 mg | Freq: Once | ORAL | Status: CP
Start: 2023-01-10 — End: ?
  Administered 2023-01-10: 15:00:00 5 mg via ORAL

## 2023-01-10 NOTE — Plan of Care
Plan of Care Overview/ Patient Status    Pt [name] is a 72 y.o. male.See CPOT f/s for pain doc. Pt with no pain at this time. Neuro: Alert and Oriented x3, can be irritable but follows commands. PERRLACV: Cardiac monitoring continues. Afebrile, Normal Sinus Rhythm - Sinus Tachycardic on Monitor, BP MAP >65.Resp: On 4L Nasal Canula. Lungs diminished, pt desaturates at times, titrated to 6L.GI: Abdomen rounded/obese, soft/non-tender, +bowel sounds, no BM overnight.GU/Endo: Pt self catherizes PRN.Skin:  See skin flowsheet. T+P q2h, wedge in use,  yes . Skin check performed. Does have foam dressing to coccyx in place. Pt remains on pressure reduction mattress and heels elevated off bed at all times. Access: PIV x1Paul Jonique Kulig, RN2/13/2024  01/10/2023   4:59 AM 01/09/2023  11:25 PM 01/09/2023   7:55 PM 01/09/2023   3:48 PM 01/09/2023   2:30 PM 01/09/2023   2:08 PM 01/09/2023  12:36 PM Patient Vitals (Clinician entered, Pt entered, device entered) Encounter Blood Pressure 127/69 114/62 110/65 122/64 118/69 127/64 110/57 Encounter Heart Rate 81 90 89 96 104 106 130 Encounter Respiratory Rate 18 18 18 20 24 22 22  Encounter Temperature 97.8 F (36.6 C) 98.8 F (37.1 C) 99.2 F (37.3 C) 98.9 F (37.2 C) 99 F (37.2 C) 97.7 F (36.5 C) 102.7 F (39.3 C) Encounter Pulse Ox 96 % 94 % 94 % 93 % 93 % 91 % 91 % See flowsheets, patient education and plan of care for additional information.

## 2023-01-10 NOTE — Plan of Care
Plan of Care Overview/ Patient Status    Pt is alert & oriented x 3. NSR 80's. Received pt on 6L NC, titrated to 5L NC SpO2 92%. Good PO intake. Pt reports abdominal discomfort which pt feels is d/t not having a BM since Saturday. Dr. Tyson Babinski aware and at bedside. Dulcolax, Miralax and Senna administered per MAR. Pt self catheterizes. Fall/ safety precautions maintained, bed alarm refused, pt educated on increased risk for falls d/t IV pain meds and O2, instructed to call for assistance prior to getting up. Call bell is within reach. 1607: Pt refusing to wear O2, SpO2 88-92% on RA. Dr. Tyson Babinski made aware. Pt agreeable to wearing 4L NC this evening after desaturation to 84% while ambulating in room on RA.

## 2023-01-10 NOTE — Other
PHARMACY-ASSISTED MEDICATION REPORTPharmacist review of the best possible medication history obtained by the pharmacy medication history technician has been performed.  I have updated the home medication list and identified the following information that may be relevant to this admission. Recommendations:  NA Notes:Per med hx tech (SR), the patient reports as a current home medication,  however, last filled dates for following medications indicate Patient is due for refills;Eliquis 5 mg - 08/14/2022 #180 for 90 daysOmeprazole 20 mg - 09/14/2022 #90 for 90 daysPotassium ER 10 MEQ - 08/31/2022 #90 for 90 daysRosuvastatin 40 mg - 08/31/2022 #90 for 90 daysMounjaro 7.5mg /0.5 ml - 09/19/2022 for 84 days Patient reports the following are NOT current home medications: Anoro - 10/08/2022 for 30 days Home medication list updated and reviewed. Medication history completed through a pharamcy med hx tech (SR) interview of the patient, a review of the pharmacy records [employer]), and a call to the patient's Pharmacy [facility] - [facility] to [facility]) to confirm medications, dose and frequency. Current Medications Medication Sig Note  ALPRAZolam (XANAX) 0.5 mg tablet Take 1 tablet (0.5 mg total) by mouth 2 (two) times daily.   ascorbic acid, vitamin C, (VITAMIN C) 500 mg tablet Take 1 tablet (500 mg total) by mouth daily.   docusate sodium (COLACE) 100 mg capsule Take 1 capsule (100 mg total) by mouth daily.   ELIQUIS 5 mg Tab tablet TAKE 1 TABLET BY MOUTH TWICE  DAILY   HYDROmorphone (DILAUDID) 4 mg tablet Take 1 tablet (4 mg total) by mouth every 6 (six) hours as needed (Pain).   INCRUSE ELLIPTA 62.5 mcg/actuation blister powder for inhalation inhale 1 puff by mouth every day   MOVANTIK 25 mg Tab Take 1 tablet (25 mg total) by mouth every morning.   MULTIVITAMIN ORAL Take 1 tablet by mouth daily.   omeprazole (PRILOSEC) 20 mg capsule Take 1 capsule (20 mg total) by mouth daily.   potassium chloride (K-TAB,KLOR-CON) 10 MEQ extended release tablet Take 1 tablet (10 mEq total) by mouth daily.   pregabalin (LYRICA) 150 mg capsule Take 1 capsule (150 mg total) by mouth 2 (two) times daily.   rosuvastatin (CRESTOR) 40 mg tablet Take 1 tablet (40 mg total) by mouth daily.   senna (SENOKOT) 8.6 mg tablet Take 1 tablet (8.6 mg total) by mouth daily.   tirzepatide 7.5 mg/0.5 mL PnIj Inject 0.5 mLs (7.5 mg total) under the skin once a week. 01/10/2023: MedHX Tech(Selina Roman, CPHT):Patient reports administering on Sundays, however, he missed last dose on 01/08/2023 Margit Hanks, PharmD3:36 PM2/13/2024Phone: Available via [employer] [facility]) [phone]

## 2023-01-10 NOTE — Plan of Care
Plan of Care Overview/ Patient Status    Received pt from surgery. A&Ox4. Irritable. Uncooperative. Vitals stable, temp 99 ( Dr. Tyson Babinski aware), sats 96% on 4L NC. Ambulates in room independently. Self caths, pt reminded to use urinal and let staff know to document output. See flowsheet for further details 1436: Refuses the remaining of LR bolus. Attending Teslya made [id number]: Refuses lovenox injection. Dr. Tyson Babinski made awareBed low/ locked. Call bell within reach. Safety and purposeful rounds maintained

## 2023-01-10 NOTE — Progress Notes
Medicine Inpatient Progress NoteAttending Provider: Rinaldo Ratel, Operating Room Services: abdominal pain, nausea and vomiting, acute hypoxic respiratory failure. Pneumonia. Subjective: The patient complains of pain in his stomach, back, general body pain. Reported that his breathing is better but not back to normal. Still on oxygen with desaturation overnight. More compliant with medications and staff today. Afebrile. Complains of chills. Denies chest pain. Telemetry reviewed, sinus rhythm 70-80, ov 89-93%, deeps at night to 82 when patient was deep asleep. The patient aware of sleep apnea but refused cpap. Complains of constipation Meds: Scheduled Meds:Current Facility-Administered Medications Medication Dose Route Frequency Provider Last Rate Last Admin ? ampicillin-sulbactam (UNASYN) 1.5 g in sodium chloride 0.9% 50 mL (mini-bag plus)  1.5 g Intravenous Q6H Rinaldo Ratel, MD 200 mL/hr at 01/10/23 0926 1.5 g at 01/10/23 0926 ? apixaban (ELIQUIS) tablet 5 mg  5 mg Oral BID Guerriero, Miranda, DO   5 mg at 01/10/23 2130 ? doxycycline TAB/CAP 100 mg  100 mg Oral Q12H Rinaldo Ratel, MD   100 mg at 01/10/23 1228 ? famotidine (PEPCID) tablet 20 mg  20 mg Oral BID Rinaldo Ratel, MD   20 mg at 01/10/23 0856 ? lidocaine 4 % topical patch 1 patch  1 patch Transdermal Q24H Guerriero, Miranda, DO     ? methylPREDNISolone (Solu-MEDROL) 40 mg in water for injection, sterile injection  40 mg Intravenous Daily Rinaldo Ratel, MD   40 mg at 01/10/23 0901 ? polyethylene glycol (MIRALAX) packet 17 g  17 g Oral BID Rinaldo Ratel, MD   17 g at 01/10/23 0935 ? Potassium chloride SA (K-DUR,KLOR-CON M) 24 hr tablet 10 mEq  10 mEq Oral Daily Guerriero, Miranda, DO   10 mEq at 01/10/23 8657 ? potassium chloride SA (K-DUR,KLOR-CON M) 24 hr tablet 20 mEq  20 mEq Oral Once Rinaldo Ratel, MD     ? pregabalin (LYRICA) capsule 150 mg  150 mg Oral BID Shepherd-Hall, Janiline En, PA   150 mg at 01/10/23 0857 ? rosuvastatin (CRESTOR) tablet 40 mg  40 mg Oral Daily Guerriero, Miranda, DO   40 mg at 01/10/23 0856 ? senna (SENOKOT) tablet 8.6 mg  1 tablet Oral Daily Rinaldo Ratel, MD   8.6 mg at 01/10/23 8469 ? sodium chloride 0.9 % flush 3 mL  3 mL IV Push Q8H Guerriero, Miranda, DO   3 mL at 01/09/23 0304 Continuous Infusions:PRN Meds:acetaminophen, ALPRAZolam, HYDROmorphone **OR** HYDROmorphone, melatonin, naloxone, sodium chlorideObjective: Vitals:Temp:  [97.7 ?F (36.5 ?C)-99.2 ?F (37.3 ?C)] 98.9 ?F (37.2 ?C)Pulse:  [81-106] 90Resp:  [18-24] 18BP: (110-143)/(62-79) 136/63SpO2:  [91 %-96 %] 94 %Device (Oxygen Therapy): nasal cannulaO2 Flow (L/min):  [4-6] 5  I/O's:Intake/Output Summary (Last 24 hours) at 01/10/2023 1313Last data filed at 01/10/2023 0926Gross per 24 hour Intake 290 ml Output 400 ml Net -110 ml  Physical Exam:General/consitutional - Appropriate to age, obese BMI 33.34, NADHEENT - NCAT, no lymphadenopathy supraclavicular, cervical, submandibular, submentalCV- S1S2 RRRPulmonary  - diminished breath sounds b/l with scattered intermittent wheezing, GI - Abdomen soft NT NDVascular - no edema pulse, 2+ DP/PTMusculoskeletal no reproducible tenderness Derm: normal colorHem/once no palpable lymph nodes, no petechia Neuro: AOx3, normal muscle strength on all extremities, no sensitivity deficit. Labs: Recent Labs Lab 02/10/240759 02/10/240816 02/11/240656 02/11/240709 02/11/241509 02/12/240540 02/13/240713 02/13/240714 WBC 12.1*  --   --  12.4* 11.4* 8.2 7.3  --  HGB 15.2  --   --  13.1* 12.1* 13.0* 11.6*  --  HCT 46.40   < >  --  40.80 38.80 42.60 35.80*  --  PLT 225  --   --  221 195 169 171  --  NEUTROPHILS 81.7*  --   --  81.6*  --   --  84.1*  --  NA 143  --  140  --   --  139  --  141 K 4.2   < > 3.5  --   --  3.6  --  3.4* CL 106  --  108  --   --  107  --  107 CO2 24  --  27  --   --  28  -- 26 BUN 11  --  10  --   --  8  --  10 CREATININE 0.77   < > 0.98  --   --  0.86  --  0.76 ANIONGAP 13  --  5  --   --  4*  --  8  < > = values in this interval not displayed.  Recent Labs Lab 02/10/240759 02/11/240656 02/12/240540 02/13/240714 ALT 32 28 24 23  AST 43* 16 18 18  ALKPHOS 170* 137* 131 113 BILITOT 0.5 0.7 0.5 0.4 BILIDIR  --   --  0.1  --  PROT 7.3 7.1 7.1 6.5 ALBUMIN 4.2 3.1* 3.1* 2.8* CALCIUM 9.0 8.3* 8.5 8.5 MG  --  2.1  --   --  PTT  --  33.3*  --   --  LABPROT 11.3 11.9  --   --  INR 1.02 1.13  --   --   Recent Labs Lab 02/10/240759 02/10/240816 02/11/240656 02/11/240746 02/12/240540 02/13/240714 GLU 111* 108* 152* 126* 134* 115* Micro:COVID 192/09/2023 negativeRespiratory virus PCR panel negative 2/11/2024Urine culture Contains abnormal data?Urine cultureOrder: [id number] - Reflex for Order [id number]	Status: Final result  ?	Visible to patient: No (inaccessible in MyChart)  ?	Next appt: 01/26/2023 at 10:00 AM in Lab Data processing manager)  ?Specimen Information: Urine 	0 Result NotesUrine Culture, Routine 50,000-99,000 CFU/mL Enterococcus ?faecalis?Abnormal?  Enterococcus species are intrinsically resistant to cephalosporins, clindamycin, and trimethoprim-sulfamethoxazole.  Resulting Agency: [facility] Susceptibility Enterococcus ?faecalis   MIC SUSCEPTIBILITY   Ampicillin <=2 ug/mL Susceptible   Ciprofloxacin 1 ug/mL Susceptible   Nitrofurantoin <=16 ug/mL Susceptible   Vancomycin 2 ug/mL Susceptible       ? Linear View   Specimen Collected: 01/08/23 ?6:56 AM Last Resulted: 01/09/23 ?7:42 PM   Blood culture 2/11 and 2/12 negative Sputum culture Pending collection Diagnostics:I independently reviewed the images or tracings within the last 48 hrs. Significant abnormals are below Chest wo IV Contrast Preliminary Result 1. Left lower lobe peribronchial opacities concerning for a bronchopneumonia. 2. Moderate to severe emphysema.   [facility] Notify System Classification: Urgent result.  Reported and signed by:  Lonia Chimera, MD   CARDIAC MISC.? RESULT SCAN Final Result  CARDIAC MISC.? RESULT SCAN Final Result  XR Chest PA or AP - portable Final Result    Stable exam. No evidence for pneumonia.  Reported and signed by:  Antonieta Iba, MD   CARDIAC MISC.? RESULT SCAN Final Result  Results for orders placed or performed during the hospital encounter of 01/08/23 EKG Result Value Ref Range  Heart Rate 118 bpm  QRS Interval 88 ms  QT Interval 318 ms  QTC Interval 445 ms  P Axis 29 deg  QRS Axis 9 deg  T Wave Axis 7 deg  P-R Interval 162 msec  SEVERITY Abnormal ECG severity Patient Active Problem List Diagnosis ? Hematuria ? Urinary retention ? Neoplasm of uncertain behavior of skin ? Basal cell carcinoma of  left side of nose ? Osteoarthritis ? Left knee pain ? Rotator cuff injury ? Hx of cervical spine surgery ? Pre-op exam ? Acute pain of right shoulder ? Tobacco use disorder ? Psychic factors associated with diseases classified elsewhere ? Left shoulder pain ? Arthritis of shoulder ? Cervical spine arthritis ? Rotator cuff arthropathy, right ? Essential hypertension ? Abdominal pain ? COPD (chronic obstructive pulmonary disease) (HC Code) ? Dupuytren's contracture of both hands ? Hypercholesterolemia ? Midline low back pain with sciatica ? Orchitis ? Urinary tract infection associated with catheterization of urinary tract (HC Code) (HC CODE) (HC Code) ? Sepsis (HC Code) ? Head injury, initial encounter ? Dizziness ? Urinary tract infection without hematuria, site unspecified ? Spondylosis of cervical region without myelopathy or radiculopathy ? Spondylosis of lumbar region without myelopathy or radiculopathy ? Facet arthropathy ? Impingement syndrome of right shoulder ? Spinal stenosis in cervical region ? Spinal stenosis of lumbar region ? S/P cervical spinal fusion ? Sacroiliac joint dysfunction of both sides ? COPD exacerbation (HC Code) ? Pneumonia due to infectious organism ? Hypokalemia ? Volume depletion ? Pneumonia of left lung due to infectious organism, unspecified part of lung ? Shortness of breath ? Pulmonary arterial thrombosis (HC Code) (HC CODE) (HC Code) ? Chronic pain ? Fatty liver ? Dupuytren's contracture of right hand ? Urinary tract infection with hematuria, site unspecified ? Pulmonary embolism (HC Code) ? Hypoxia ? Chronic pulmonary aspiration ? History of choking ? Food impaction of esophagus, initial encounter ? Coffee ground emesis ? Nausea and vomiting, unspecified vomiting type ? Acute respiratory failure with hypoxia (HC Code) (HC CODE) (HC Code) Assessment & Plan: 72 y.o. 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  Patient had EGD which revealed small erosions in the stomach with erythematous duodenopathy.  Hospital course complicated by acute hypoxic respiratory failure, fever, UTI and possible pneumonia.  Patient also was noticed to have some dysphagia but refused speech and swallow eval.  Patient was noncompliant with medications in hospital, refusing certain medications. Okay chest showed pneumonia. Patient started on unasyn and doxy for uti and pneumonia. He also started on iv steroids for COPD exacerbation with improvement. PlanAcute hypoxic respiratory failurePneumoniaUTI, EnterococcusCOPD exacerbationUnasyn IV day #2Doxycycline day # 2 Solu-Medrol 40 milligrams IV daily day#2Sputum if able to produceMonitor CRP dailyOxygen supplementation, wean as toleratedTylenol as needed for feverPatient reported adverse reaction to Spiriva, refused DuoNebs, Spiriva or albuterolHold home Incruse Ellipta secondary to non formularyNausea, vomiting, possible coffee-groundDysphagiaStatus post EGD, mild gastritis and duodenitisPatient refused ProtonixFamotidineStool H pylori orderedAppreciate GI inputAnti nausea medications as neededPatient refused speech and swallow evalAvoid NSAIDsConstipation miralax bid Senna nightly Bisacodyl once History of PEFull anticoagulation with apixabanCoagulopathy secondary to apixabanApixaban 5 mg bid Back pain,chronicThe patient is on Dilaudid 4 milligrams every 6 hours as needed orally at homeDue to shortage of oral Dilaudid:  Dilaudid 2 milligrams IV every 6 hours for severe pain, 1 milligram for moderate painLyrica 150 mg bidLidocaine patchAnxietyXanax 0.5 milligrams twice daily as neededObstructive sleep apneaPatient refusing CPAPOxygen as neededBPH, chronic urinary retentionCell catheterization with sterile technique## Diet:  Cardiac## Proph:  On full anticoagulation with apixaban## Code:  Full codeSigned:Kodi Steil MDPager [id number] 13, 20241:13 PMThe above report was partially generated using voice recognition software. It may contain grammatical,syntax and spelling  errors. Please contact the hospitalist office for questions or clarification

## 2023-01-11 DIAGNOSIS — J8410 Pulmonary fibrosis, unspecified: Secondary | ICD-10-CM

## 2023-01-11 DIAGNOSIS — J440 Chronic obstructive pulmonary disease with acute lower respiratory infection: Secondary | ICD-10-CM

## 2023-01-11 DIAGNOSIS — N401 Enlarged prostate with lower urinary tract symptoms: Secondary | ICD-10-CM

## 2023-01-11 DIAGNOSIS — G8929 Other chronic pain: Secondary | ICD-10-CM

## 2023-01-11 DIAGNOSIS — Z91199 Patient's noncompliance with other medical treatment and regimen due to unspecified reason: Secondary | ICD-10-CM

## 2023-01-11 DIAGNOSIS — J189 Pneumonia, unspecified organism: Secondary | ICD-10-CM

## 2023-01-11 DIAGNOSIS — R1310 Dysphagia, unspecified: Secondary | ICD-10-CM

## 2023-01-11 DIAGNOSIS — Z91148 Patient's other noncompliance with medication regimen for other reason: Secondary | ICD-10-CM

## 2023-01-11 DIAGNOSIS — N390 Urinary tract infection, site not specified: Secondary | ICD-10-CM

## 2023-01-11 DIAGNOSIS — E669 Obesity, unspecified: Secondary | ICD-10-CM

## 2023-01-11 DIAGNOSIS — Z86711 Personal history of pulmonary embolism: Secondary | ICD-10-CM

## 2023-01-11 DIAGNOSIS — K219 Gastro-esophageal reflux disease without esophagitis: Secondary | ICD-10-CM

## 2023-01-11 DIAGNOSIS — R338 Other retention of urine: Secondary | ICD-10-CM

## 2023-01-11 DIAGNOSIS — Z8711 Personal history of peptic ulcer disease: Secondary | ICD-10-CM

## 2023-01-11 DIAGNOSIS — F419 Anxiety disorder, unspecified: Secondary | ICD-10-CM

## 2023-01-11 DIAGNOSIS — Z1152 Encounter for screening for COVID-19: Secondary | ICD-10-CM

## 2023-01-11 DIAGNOSIS — J441 Chronic obstructive pulmonary disease with (acute) exacerbation: Secondary | ICD-10-CM

## 2023-01-11 DIAGNOSIS — Z981 Arthrodesis status: Secondary | ICD-10-CM

## 2023-01-11 DIAGNOSIS — Z85828 Personal history of other malignant neoplasm of skin: Secondary | ICD-10-CM

## 2023-01-11 DIAGNOSIS — Z87891 Personal history of nicotine dependence: Secondary | ICD-10-CM

## 2023-01-11 DIAGNOSIS — K2971 Gastritis, unspecified, with bleeding: Secondary | ICD-10-CM

## 2023-01-11 DIAGNOSIS — Z7985 Long-term (current) use of injectable non-insulin antidiabetic drugs: Secondary | ICD-10-CM

## 2023-01-11 DIAGNOSIS — Z7901 Long term (current) use of anticoagulants: Secondary | ICD-10-CM

## 2023-01-11 DIAGNOSIS — I10 Essential (primary) hypertension: Secondary | ICD-10-CM

## 2023-01-11 DIAGNOSIS — K7581 Nonalcoholic steatohepatitis (NASH): Secondary | ICD-10-CM

## 2023-01-11 DIAGNOSIS — Z28310 Unvaccinated for covid-19: Secondary | ICD-10-CM

## 2023-01-11 DIAGNOSIS — Z5329 Procedure and treatment not carried out because of patient's decision for other reasons: Secondary | ICD-10-CM

## 2023-01-11 DIAGNOSIS — G4733 Obstructive sleep apnea (adult) (pediatric): Secondary | ICD-10-CM

## 2023-01-11 DIAGNOSIS — Z6837 Body mass index (BMI) 37.0-37.9, adult: Secondary | ICD-10-CM

## 2023-01-11 DIAGNOSIS — K2981 Duodenitis with bleeding: Secondary | ICD-10-CM

## 2023-01-11 DIAGNOSIS — J9601 Acute respiratory failure with hypoxia: Secondary | ICD-10-CM

## 2023-01-11 DIAGNOSIS — B952 Enterococcus as the cause of diseases classified elsewhere: Secondary | ICD-10-CM

## 2023-01-11 DIAGNOSIS — M549 Dorsalgia, unspecified: Secondary | ICD-10-CM

## 2023-01-11 LAB — CBC WITH AUTO DIFFERENTIAL
BKR WAM ABSOLUTE LYMPHOCYTE COUNT.: 1 x 1000/??L (ref 0.60–3.70)
BKR WAM ABSOLUTE NRBC (2 DEC): 0 x 1000/??L (ref 0.00–1.00)
BKR WAM ANALYZER ANC: 5.37 x 1000/??L (ref 2.00–7.60)
BKR WAM BASOPHIL ABSOLUTE COUNT.: 0.01 x 1000/ÂµL (ref 0.00–1.00)
BKR WAM BASOPHILS: 0.1 % (ref 0.0–1.4)
BKR WAM EOSINOPHILS: 0 % (ref 0.0–5.0)
BKR WAM HEMOGLOBIN: 11.7 g/dL — ABNORMAL LOW (ref 13.2–17.1)
BKR WAM IMMATURE GRANULOCYTES: 0.3 % (ref 0.0–1.0)
BKR WAM LYMPHOCYTES: 14.6 % — ABNORMAL LOW (ref 17.0–50.0)
BKR WAM MCH (PG): 28.3 pg (ref 27.0–33.0)
BKR WAM MCHC: 31.1 g/dL (ref 31.0–36.0)
BKR WAM MCV: 90.8 fL (ref 80.0–100.0)
BKR WAM MONOCYTE ABSOLUTE COUNT.: 0.45 x 1000/ÂµL (ref 0.00–1.00)
BKR WAM MONOCYTES: 6.6 % (ref 4.0–12.0)
BKR WAM MPV: 10.6 fL (ref 8.0–12.0)
BKR WAM NEUTROPHILS: 78.4 % — ABNORMAL HIGH (ref 39.0–72.0)
BKR WAM NUCLEATED RED BLOOD CELLS: 0 % (ref 0.0–1.0)
BKR WAM PLATELETS: 176 x1000/ÂµL (ref 150–420)
BKR WAM RDW-CV: 15.6 % — ABNORMAL HIGH (ref 11.0–15.0)
BKR WAM RED BLOOD CELL COUNT.: 4.14 M/??L (ref 4.00–6.00)
BKR WAM WHITE BLOOD CELL COUNT: 6.9 x1000/ÂµL (ref 4.0–11.0)

## 2023-01-11 LAB — COMPREHENSIVE METABOLIC PANEL
BKR A/G RATIO: 0.7 x 1000/??L (ref 0.00–1.00)
BKR ALANINE AMINOTRANSFERASE (ALT): 26 U/L (ref 12–78)
BKR ALBUMIN: 2.8 g/dL — ABNORMAL LOW (ref 3.4–5.0)
BKR ANION GAP: 6 g/dL (ref 5–18)
BKR ASPARTATE AMINOTRANSFERASE (AST): 32 U/L (ref 5–37)
BKR AST/ALT RATIO: 1.2
BKR BILIRUBIN TOTAL: 0.5 mg/dL (ref 0.0–1.0)
BKR BLOOD UREA NITROGEN: 11 mg/dL (ref 8–25)
BKR BUN / CREAT RATIO: 12.8 % — ABNORMAL LOW (ref 8.0–25.0)
BKR CALCIUM: 8.1 mg/dL — ABNORMAL LOW (ref 8.4–10.3)
BKR CHLORIDE: 104 mmol/L (ref 95–115)
BKR CO2: 26 mmol/L (ref 21–32)
BKR CREATININE: 0.86 mg/dL (ref 0.50–1.30)
BKR EGFR, CREATININE (CKD-EPI 2021): >60 mL/min/{1.73_m2} (ref >=60)
BKR GLOBULIN: 4.2 g/dL
BKR GLUCOSE: 112 mg/dL — ABNORMAL HIGH (ref 70–100)
BKR OSMOLALITY CALCULATION: 272 mOsm/kg — ABNORMAL LOW (ref 275–295)
BKR POTASSIUM: 3.8 mmol/L (ref 3.5–5.1)
BKR PROTEIN TOTAL: 7 g/dL (ref 6.4–8.2)
BKR SODIUM: 136 mmol/L — ABNORMAL LOW (ref 136–145)
BKR WAM ABSOLUTE IMMATURE GRANULOCYTES.: >60 mL/min/1.73m2 (ref >=60–0.30)
BKR WAM EOSINOPHIL ABSOLUTE COUNT.: 1.2 x 1000/??L (ref 0.00–1.00)
BKR WAM HEMATOCRIT (2 DEC): 3.8 mmol/L — ABNORMAL LOW (ref 3.5–5.1)

## 2023-01-11 LAB — C-REACTIVE PROTEIN     (CRP): BKR C-REACTIVE PROTEIN: 4.4 mg/dL — ABNORMAL HIGH (ref 0.0–1.0)

## 2023-01-11 MED ORDER — AMOXICILLIN 875 MG-POTASSIUM CLAVULANATE 125 MG TABLET
875-125 mg | ORAL_TABLET | Freq: Two times a day (BID) | ORAL | 1 refills | Status: AC
Start: 2023-01-11 — End: ?

## 2023-01-11 MED ORDER — PREDNISONE 20 MG TABLET
20 mg | ORAL_TABLET | Freq: Every day | ORAL | 1 refills | Status: AC
Start: 2023-01-11 — End: ?

## 2023-01-11 MED ORDER — DOXYCYCLINE HYCLATE 100 MG CAPSULE
100 mg | ORAL_CAPSULE | Freq: Two times a day (BID) | ORAL | 1 refills | Status: AC
Start: 2023-01-11 — End: ?

## 2023-01-11 NOTE — Discharge Instructions
Please complete course of antibiotics and steroids. Please use oxygen for low oxygen saturation and dyspnea, CPAP at nightTake other medications as prescribed Follow up with your pulmonologist, PCP and pain specialist Good Luck!

## 2023-01-11 NOTE — Plan of Care
Received patient resting in bed. Alert and oriented, c/o chronic back pain well controlled with PRN medications. Oral temp 100.1 F this morning, MD aware. IV abx administered as per order. SR 60-80s, B/P 110s-130s/70s. Saturating appropriately on 3-4 L oxygen via nasal cannula, lungs diminished, saturating from 90-94%. Abdomen softly rounded, +BS, -BM, bowel regimen in place. Patient is independent with self-catheterization, putting out yellow urine. Tolerating diet. Skin intact. Safety/fall precautions maintained.     1515- Patient left AMA, MD spoke with patient at length, aware of the risks associated with leaving without treatment, all personal belongings removed from room. Oxygen delivery completed. Patient aware of prescriptions available for pick up at pharmacy. Safely off the unit.

## 2023-01-11 NOTE — Plan of Care
Plan of Care Overview/ Patient Status    Assumed care of Pt at 1900. Pt alert and oriented x4. VSS on 4L NC. NS on tele. Pt reporting back pain medicated per MAR. +pulses, denies numbness/tingling to BLE. Abdomen round, nontender. Pt reports LBM 2/10 bowel regimen in place. Pt straight catheterizes himself as needed. Independent OOB to bathroom, steady gait. Refuse bed alarm. Right PIV infiltrated, new PIV in left hand. Pt resting comfortably, call bell within reach, will continue with POC. Problem: Adult Inpatient Plan of CareGoal: Plan of Care ReviewOutcome: Interventions implemented as appropriateGoal: Optimal Comfort and WellbeingOutcome: Interventions implemented as appropriate

## 2023-01-11 NOTE — Care Coordination-Inpatient
TC spoke with Dawn at [facility] [phone]) and driver Trudie Buckler will be delivering oxygen within 30 minutes to patients room and following home set. CM Rachelle aware2/14/24 3:03 pm TC spoke with patient and oxygen has been delivered to room and he will contact [employer] for home set up. [facility] MelendezTransition Coordinator [facility] [address], [place] [address]DGLOV: [phone] MHB: [phone]

## 2023-01-11 NOTE — Progress Notes
Medicine Inpatient Progress NoteAttending Provider: Rinaldo Ratel, [facility]: abdominal pain, nausea and vomiting, acute hypoxic respiratory failure. Pneumonia. Subjective: The patient reported that he feels great and wants to go home.  However, he is febrile this morning.  He desaturated to 84% on room air, still on 3 to 4 liters of oxygen.  We discussed the need of the oxygen but patient insisted of leaving.  I explained to him that his condition is very tenuous, he is still need oxygen, antibiotics and the risk of getting sick and die is very high if he leaves AMA.  I explained to the patient that if he wants to live he needs to sign out AMA.  Patient agreed but said he can not stay in the hospital any longer, he has been treated ?badly?.  He said he could not sleep at hospital and would like to go home.Meds: Scheduled Meds:Current Facility-Administered Medications Medication Dose Route Frequency Provider Last Rate Last Admin ? ampicillin-sulbactam (UNASYN) 1.5 g in sodium chloride 0.9% 50 mL (mini-bag plus)  1.5 g Intravenous Q6H Rinaldo Ratel, MD 200 mL/hr at 01/11/23 0946 1.5 g at 01/11/23 0946 ? apixaban (ELIQUIS) tablet 5 mg  5 mg Oral BID Guerriero, Miranda, DO   5 mg at 01/11/23 0830 ? doxycycline TAB/CAP 100 mg  100 mg Oral Q12H Rinaldo Ratel, MD   100 mg at 01/11/23 1135 ? famotidine (PEPCID) tablet 20 mg  20 mg Oral BID Rinaldo Ratel, MD   20 mg at 01/11/23 1610 ? lidocaine 4 % topical patch 1 patch  1 patch Transdermal Q24H Guerriero, Miranda, DO     ? methylPREDNISolone (Solu-MEDROL) 40 mg in water for injection, sterile injection  40 mg Intravenous Daily Rinaldo Ratel, MD   40 mg at 01/11/23 9604 ? polyethylene glycol (MIRALAX) packet 17 g  17 g Oral BID Rinaldo Ratel, MD   17 g at 01/11/23 0830 ? Potassium chloride SA (K-DUR,KLOR-CON M) 24 hr tablet 10 mEq  10 mEq Oral Daily Guerriero, Miranda, DO   10 mEq at 01/11/23 5409 ? pregabalin (LYRICA) capsule 150 mg  150 mg Oral BID Shepherd-Hall, Janiline En, PA   150 mg at 01/11/23 8119 ? rosuvastatin (CRESTOR) tablet 40 mg  40 mg Oral Daily Guerriero, Miranda, DO   40 mg at 01/11/23 1478 ? senna (SENOKOT) tablet 17.2 mg  2 tablet Oral Nightly Rinaldo Ratel, MD   17.2 mg at 01/10/23 2126 ? sodium chloride 0.9 % flush 3 mL  3 mL IV Push Q8H Guerriero, Miranda, DO   3 mL at 01/09/23 0304 Continuous Infusions:PRN Meds:acetaminophen, ALPRAZolam, HYDROmorphone **OR** HYDROmorphone, melatonin, naloxone, sodium chlorideObjective: Vitals:Temp:  [98 ?F (36.7 ?C)-102.4 ?F (39.1 ?C)] 100 ?F (37.8 ?C)Pulse:  [79-92] 89Resp:  [18-20] 20BP: (105-150)/(56-77) 122/66SpO2:  [84 %-96 %] 90 %Device (Oxygen Therapy): nasal cannulaO2 Flow (L/min):  [4] 4  I/O's:Intake/Output Summary (Last 24 hours) at 01/11/2023 1431Last data filed at 01/10/2023 1700Gross per 24 hour Intake 530 ml Output -- Net 530 ml  Physical Exam:General/consitutional - Appropriate to age, obese BMI 33.34 NADHEENT - NCAT, no lymphadenopathy supraclavicular, cervical, submandibular, submentalCV- S1S2 RRRPulmonary  - b/l wheezing, no rales GI - Abdomen soft NT NDVascular - no edema pulse 2+ DP/PTMusculoskeletal no reproducible tenderness Derm: normal colorHem/once no palpable lymph nodes, no petechia Neuro: AOx3, normal muscle strength on all extremities, no sensitivity deficit. Labs: Recent Labs Lab 02/11/240709 02/11/241509 02/12/240540 02/13/240713 02/13/240714 02/14/240628 WBC 12.4*   < > 8.2 7.3  --  6.9 HGB 13.1*   < >  13.0* 11.6*  --  11.7* HCT 40.80   < > 42.60 35.80*  --  37.60* PLT 221   < > 169 171  --  176 NEUTROPHILS 81.6*  --   --  84.1*  --  78.4* NA  --   --  139  --  141 136 K  --   --  3.6  --  3.4* 3.8 CL  --   --  107  --  107 104 CO2  --   --  28  --  26 26 BUN  --   --  8  --  10 11 CREATININE  --   --  0.86  --  0.76 0.86 ANIONGAP  -- --  4*  --  8 6  < > = values in this interval not displayed.  Recent Labs Lab 02/10/240759 02/11/240656 02/12/240540 02/13/240714 02/14/240628 ALT 32 28 24 23 26  AST 43* 16 18 18  32 ALKPHOS 170* 137* 131 113 116 BILITOT 0.5 0.7 0.5 0.4 0.5 BILIDIR  --   --  0.1  --   --  PROT 7.3 7.1 7.1 6.5 7.0 ALBUMIN 4.2 3.1* 3.1* 2.8* 2.8* CALCIUM 9.0 8.3* 8.5 8.5 8.1* MG  --  2.1  --   --   --  PTT  --  33.3*  --   --   --  LABPROT 11.3 11.9  --   --   --  INR 1.02 1.13  --   --   --   Recent Labs Lab 02/10/240759 02/10/240816 02/11/240656 02/11/240746 02/12/240540 02/13/240714 02/14/240628 GLU 111* 108* 152* 126* 134* 115* 112* Micro:COVID 192/09/2023 negativeRespiratory virus PCR panel negative 2/11/2024Urine culture Contains abnormal data?Urine cultureOrder: [id number] - Reflex for Order [id number]	Status: Final result  ?	Visible to patient: No (inaccessible in MyChart)  ?	Next appt: 01/26/2023 at 10:00 AM in Lab Data processing manager)  ?Specimen Information: Urine 	0 Result NotesUrine Culture, Routine 50,000-99,000 CFU/mL Enterococcus ?faecalis?Abnormal?  Enterococcus species are intrinsically resistant to cephalosporins, clindamycin, and trimethoprim-sulfamethoxazole.  Resulting Agency: [facility] Susceptibility Enterococcus ?faecalis   MIC SUSCEPTIBILITY   Ampicillin <=2 ug/mL Susceptible   Ciprofloxacin 1 ug/mL Susceptible   Nitrofurantoin <=16 ug/mL Susceptible   Vancomycin 2 ug/mL Susceptible       ? Linear View   Specimen Collected: 01/08/23 ?6:56 AM Last Resulted: 01/09/23 ?7:42 PM   Blood culture 2/11 and 2/12 negative Sputum culture Pending collection Diagnostics:I independently reviewed the images or tracings within the last 48 hrs. Significant abnormals are below.CARDIAC MISC.? RESULT SCAN Final Result  CARDIAC MISC.? RESULT SCAN Final Result  [place] Chest wo IV Contrast Preliminary Result 1. Left lower lobe peribronchial opacities concerning for a bronchopneumonia. 2. Moderate to severe emphysema.  [facility] Radiology Notify System Classification: Urgent result.  Reported and signed by:  Lonia Chimera, MD   CARDIAC MISC.? RESULT SCAN Final Result  CARDIAC MISC.? RESULT SCAN Final Result  XR Chest PA or AP - portable Final Result    Stable exam. No evidence for pneumonia.  Reported and signed by:  Antonieta Iba, MD   CARDIAC MISC.? RESULT SCAN Final Result  Results for orders placed or performed during the hospital encounter of 01/08/23 EKG Result Value Ref Range  Heart Rate 118 bpm  QRS Interval 88 ms  QT Interval 318 ms  QTC Interval 445 ms  P Axis 29 deg  QRS Axis 9 deg  T Wave Axis 7 deg  P-R Interval 162 msec  SEVERITY Abnormal ECG severity Patient Active Problem List Diagnosis ?  Hematuria ? Urinary retention ? Neoplasm of uncertain behavior of skin ? 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 Osteoarthritis ? Left knee pain ? Rotator cuff injury ? Hx of cervical spine surgery ? Pre-op exam ? Acute pain of right shoulder ? Tobacco use disorder ? Psychic factors associated with diseases classified elsewhere ? Left shoulder pain ? Arthritis of shoulder ? Cervical spine arthritis ? Rotator cuff arthropathy, right ? Essential hypertension ? Abdominal pain ? COPD (chronic obstructive pulmonary disease) (HC Code) ? Dupuytren's contracture of both hands ? Hypercholesterolemia ? Midline low back pain with sciatica ? Orchitis ? Urinary tract infection associated with catheterization of urinary tract (HC Code) (HC CODE) (HC Code) ? Sepsis (HC Code) ? Head injury, initial encounter ? Dizziness ? Urinary tract infection without hematuria, site unspecified ? Spondylosis of cervical region without myelopathy or radiculopathy ? Spondylosis of lumbar region without myelopathy or radiculopathy ? Facet arthropathy ? Impingement syndrome of right shoulder ? Spinal stenosis in cervical region ? Spinal stenosis of lumbar region ? S/P cervical spinal fusion ? Sacroiliac joint dysfunction of both sides ? COPD exacerbation (HC Code) ? Pneumonia due to infectious organism ? Hypokalemia ? Volume depletion ? Pneumonia of left lung due to infectious organism, unspecified part of lung ? Shortness of breath ? Pulmonary arterial thrombosis (HC Code) (HC CODE) (HC Code) ? Chronic pain ? Fatty liver ? Dupuytren's contracture of right hand ? Urinary tract infection with hematuria, site unspecified ? Pulmonary embolism (HC Code) ? Hypoxia ? Chronic pulmonary aspiration ? History of choking ? Food impaction of esophagus, initial encounter ? Coffee ground emesis ? Nausea and vomiting, unspecified vomiting type ? Acute respiratory failure with hypoxia (HC Code) (HC CODE) (HC Code) ? Pneumonia due to organism Assessment & Plan: 72 y.o. 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  Patient had EGD which revealed small erosions in the stomach with erythematous duodenopathy.  Hospital course complicated by acute hypoxic respiratory failure, fever, UTI and possible pneumonia.  Patient also was noticed to have some dysphagia but refused speech and swallow eval.  Patient was noncompliant with medications in hospital, refusing certain medications. [place] chest showed pneumonia. Patient started on unasyn and doxy for uti and pneumonia. He also started on iv steroids for COPD exacerbation with improvement.  The patient remained on oxygen.  However he decided to leave AMA.  After long conversation with the patient and explanation of the risk of leaving AMA including getting more sick and death patient still insisted on leaving AMA.  Patient signed AMA for.  I will prescribe him steroids and oral antibiotics, ask case manager to set up oxygen for him at home.PlanAcute hypoxic respiratory failurePneumoniaUTI, EnterococcusCOPD exacerbationUnasyn IV day #3Doxycycline day # 3 Solu-Medrol 40 milligrams IV daily day#3Sputum if able to produceMonitor CRP dailyOxygen supplementation, wean as toleratedTylenol as needed for feverPatient reported adverse reaction to Spiriva, refused DuoNebs, Spiriva or albuterolHold home Incruse Ellipta secondary to non formularyNausea, vomiting, possible coffee-groundDysphagiaStatus post EGD, mild gastritis and duodenitisPatient refused ProtonixFamotidineStool H pylori ordered, still constipated Appreciate GI inputAnti nausea medications as neededPatient refused speech and swallow evalAvoid NSAIDsConstipation miralax bid Senna nightly Bisacodyl once History of PEFull anticoagulation with apixabanCoagulopathy secondary to apixabanApixaban 5 mg bid Back pain,chronicThe patient is on Dilaudid 4  milligrams every 6 hours as needed orally at homeDue to shortage of oral Dilaudid:  Dilaudid 2 milligrams IV every 6 hours for severe pain, 1 milligram for moderate painLyrica 150 mg bidLidocaine patchAnxietyXanax 0.5 milligrams twice daily as neededObstructive sleep apneaPatient refusing CPAPOxygen as neededBPH, chronic urinary retentionCell catheterization with sterile techniqueDispo:  Patient decided to leave AMA.  Case manager is setting up home oxygen.  I will prescribe him oral antibiotics and steroids for discharge.  Unfortunately patient is not amenable to reasoning and signing himself out AMA.## Diet:  Cardiac## Proph:  On full anticoagulation with apixaban## Code:  Full codeSigned:Jayelle Page MDPager [id number] 14, 20242:31 PMThe above report was partially generated using voice recognition software. It may contain grammatical,syntax and spelling errors. Please contact the hospitalist office for questions or clarification
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
## 2023-01-11 NOTE — Care Coordination-Inpatient
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 provider] apixaban (ELIQUIS) tablet 5 mg  5 mg Oral BID [name], Miranda, DO     ? pantoprazole (PROTONIX) 40 mg in sodium chloride 0.9% PF 10 mL (4 mg/mL)  40 mg IV Push Q12H [name], Miranda, DO     ? [Held by provider] Potassium chloride SA (K-DUR,KLOR-CON M) 24 hr tablet 10 mEq  10 mEq Oral Daily [name], Mayking, DO     ? [Held by provider] pregabalin (LYRICA) capsule 100 mg  100 mg Oral Daily [name], Miranda, DO     ? [START ON 01/09/2023] rosuvastatin (CRESTOR) tablet 40 mg  40 mg Oral Daily [name], Miranda, DO     ? sodium chloride 0.9 % flush 3 mL  3 mL IV Push Q8H [name], Miranda, DO     ?? acetaminophen  650 mg Oral Q8H PRN ? sodium chloride  3 mL IV Push PRN for Line Care ??Objective: ?Vitals:Temp:  [98 ?F (36.7 ?C)-100.5 ?F (38.1 ?C)] 98.5 ?F (36.9 ?C)Pulse:  [88-116] 88Resp:  [18-40] 23BP: (83-123)/(52-63) 95/58SpO2:  [85 %-95 %] 91 %Device (Oxygen Therapy): NC ?Physical Exam:Gen: Appears obese, well at restHEENT: Anicteric, mild conjunctival pallor, oropharynx without pallor or blood, mucus membranes moistCV: Heart sounds quiet. RRR, no m/r/g appreciatedPulm: Expiratory bibasliar crackles. Breathing comfortably on  NC. GI: Soft, non-tender, no distension or guarding, +BS.Ext: Trace peripheral edema, warm and well perfused.Neuro: A&O x 3. Face symmetrical. Eyes track movement appropriately. Speech fluent and articulate. MSK: Moves all extremities spontaneously.Skin: No rashes or ecchymosis. Labs: ?    Recent Labs Lab 02/10/240759 02/10/240816 02/11/240709 WBC 12.1*  --  12.4* HGB 15.2  --  13.1* HCT 46.40 49.0 40.80 PLT 225  --  221     Recent Labs Lab 02/10/240759 02/11/240709 NEUTROPHILS 81.7* 81.6*       Recent Labs Lab 02/10/240759 02/10/240816 02/11/240656 02/11/240746 NA 143  --  140  --  K 4.2 4.0 3.5  --  CL 106  --  108  --  CO2 24  --  27  --  BUN 11  --  10  --  CREATININE 0.77 0.8 0.98  --  GLU 111* 108* 152* 126* ANIONGAP 13  --  5  --      Recent Labs Lab 02/10/240759 02/11/240656 CALCIUM 9.0 8.3*     Recent Labs Lab 02/10/240759 02/11/240656 ALT 32 28 AST 43* 16 ALKPHOS 170* 137* BILITOT 0.5 0.7 PROT 7.3 7.1 ALBUMIN 4.2 3.1*     Recent Labs Lab 02/10/240759 02/11/240656 PTT  --  33.3* LABPROT 11.3 11.9 INR 1.02 1.13  No results for input(s): TROPONINI in the last 72 hours. No results for input(s): PROCALCITON in the last 72 hours. ??Microbiology: ??   _ Blood culture ? Collection Time: 01/08/23  6:56 AM ? Specimen: Vein, Peripheral; Blood Result Value ? Blood Culture No Growth to Date Blood culture ? Collection Time: 01/08/23  6:56 AM ? Specimen: Vein, Peripheral; Blood Result Value ? Blood Culture No Growth to Date ?      _ Urine culture - Lab collect ? Collection Time: 11/24/21  3:18 PM ? Specimen: Straight Cath Urine; Culture Result Value ? Urine Culture, Routine >=100,000 CFU/mL Escherichia coli (A)     Susceptibility ? Escherichia coli -  (no method available) ? ? Ampicillin ? Resistant ug/mL ? ? Ampicillin + Sulbactam ? Intermediate ug/mL ? ? Cefazolin  ? Susceptible ug/mL ? ? Ciprofloxacin ? Resistant ug/mL ? ? Gentamicin ? Susceptible ug/mL ? ? Nitrofurantoin ? Susceptible ug/mL ? ? Trimethoprim + Sulfamethoxazole ? Susceptible ug/mL _ Urine culture - Clinic Collect ? Collection Time: 11/08/21  6:17 PM ? Specimen: Straight Cath Urine; Culture Result Value ? Urine Culture, Routine >=100,000 CFU/mL Escherichia coli (A)     Susceptibility ? Escherichia coli -  (no method available) ? ? Ampicillin ? Resistant ug/mL ? ? Ampicillin + Sulbactam ? Intermediate ug/mL ? ? Cefazolin  ? Susceptible ug/mL ? ? Ciprofloxacin ? Resistant ug/mL ? ? Gentamicin ? Susceptible ug/mL ? ? Nitrofurantoin ? Susceptible ug/mL ? ? Trimethoprim + Sulfamethoxazole ? Susceptible ug/mL ?      _ Lower respiratory culture, qualitative ? Collection Time: 05/24/20  9:31 PM ? Specimen: Sputum; Culture Result Value ? Lower Respiratory Culture 1+ Staphylococcus aureus (A) ? Gram Stain (Primary) 1+ WBC's ? Gram Stain (Primary) Mixed flora     Susceptibility ? Staphylococcus aureus -  (no method available) ? ? Clindamycin ? Resistant ? ? ? Erythromycin ? Resistant ? ? ? Gentamicin ? Susceptible ug/mL ? ? Oxacillin ? Susceptible ug/mL ? ? Tetracycline ? Susceptible ug/mL ? ? Trimethoprim + Sulfamethoxazole ? Susceptible ug/mL ? ? Vancomycin ? Susceptible ug/mL ??Imaging/EKG: ?XR Chest PA or AP - portable?Result Date: 01/08/2023 Stable exam. No evidence for pneumonia. Reported and signed by:  Loma Sender  Celine Mans, MD ??    Results for orders placed or performed during the [name] encounter of 01/30/20 Echo 2D Complete w Doppler and CFI if Ind Image Enhancement 3D and or bubbles Result Value Ref Range ? Reported Visual Range EF% 55-60 % ? Narrative ?  * Technically very limited* Normal left ventricular size and systolic function. Moderate concentric left ventricular hypertrophy.  LVEF estimated by visual assessment was between 55-60%.  Normal diastolic function and filling pressures.* Normal right ventricular cavity size.  Mildly decreased right ventricular systolic function on contrast images.  Estimated right ventricular systolic pressure is 26 mmHg.  TAPSE 2.0 cm- low nl.* Aortic valve was not well visualized.* Normal mitral valve leaflets.  No mitral regurgitation.  No mitral stenosis.* Tricuspid valve was not well visualized.* Pulmonic valve was not well visualized.* All visible segments of the aorta are normal in size.* IVC diameter < 2.1 cm that collapses < 50% with a sniff suggests mildly increased RAP (5-10 mmHg, mean 8 mmHg).* No evidence of pericardial effusion.  ?Assessment/Plan: [name] is a 72 y.o. male with PMHx of COPD, pulmonary fibrosis s/p RLL wedge resection c/b PE (2019), chronic back pain s/p cervical spine fusion, demyelinating peripheral neuropathy, cricopharyngeal dysfunction c/b food impaction s/p EGD disimpaction (2022), NASH, GERD, HTN, migraine and former smoking presented on 01/08/2023  6:54 AM with hematemesis . He is admitted for evaluation of upper GI bleed likely secondary to ibuprofen use for chronic pain and apixaban use with monitoring for sepsis given tachycardic and hypoxic on presentation to the ED. ??Attending: Leanora Ivanoff, DO;Heller*Consults: Dr. Farris Has GI?# Upper GI BleedPresents with hematemesis, uses ibuprofen-pantoprazole 40mg  IV BID- NPO- maintain 2 peripheral 14 gauge IVs- maintain Hgb >7 unless symptomatic. If worsening pain, tachycardia, hypotension collect cbc and type and screen statAppreciate GI Dr. Farris Has recommendations               - EGD at 1400?# Sepsis? Tachycardic, febrile, hypoxic on admission. On NC during exam- nasal cannula prn to maintain SpO2 88-92%- f/u blood cultures?# COPD # Hx Pulmonary fibrosis# OSA# Obesity hypoventilation syndrome?# Former smokingFollows with Dr. Billie Ruddy as outpatient. 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 Oxygen saturation lowers to 85% while sleeping- hold PTA incruse ellipta, has reaction to tiotropium and symbicort- outpatient follow up with pulmonology?# Hx Pulmonary EmbolismDeveloped thrombosis after RLL wedge resection in 2019. Follows with Dr. Nedra Hai as an outpatient- hold PTA apixaban 5mg  BID- consider consultation to heme to determine whether to resume apixaban # Chronic back pain# Headache# Hx Migraine- hold PTA 4mg  dilaudid Q6H prn- hold PTA pregabalin 150mg  BID- hold PTA topiramate 25 mg nightly- discontinue ibuprofen- hold PTA vitamin C- tylenol prn- ice packs prn?# Dysphagia# Hx Cricopharyngeal dysfunction- SLP eval?# Bowel regimen- hold PTA Movantik 25mg  daily- hold PTA colace 100mg  daily- hold PTA senna daily- consider miralax daily pending EGD results?# Hx hypokalemiaLikely secondary to topiramate use- hold PTA KCl 10mg  daily- f/u mag?# Hx demyelinating peripheral neuropathy# Hyperglycemia- hold PTA alprazolam 0.5mg  BID while monitoring for acute neuro changes with GI bleed- hold PTA tirzepatide- f/u Hgb A1C- consider outpatient neurology evaluation?# Hx NASH?# Pancreatitis?Elevated alk phos may be secondary to liver pathology- f/u lipase-  outpatient follow up for liver evaluation ?# Pulm HTN?# ED? Echocardiogram 2021 with no evidence of elevated right systolic pressure- hold PTA tadalifil 5mg  daily?# HLD- continue rosuvastatin 40mg  daily?Devices: noneAccess: Peripheral IVFluids: PO Diet: Diet NPO Effective NowProphylaxis:  SCDsDisposition: medical floorCode status: Full Code  Emergency Contacts  ? Name Relation Home Work Mobile ? Koenigs,Lars Brother [phone]   ?   Current Code Status  ? Prior ?  ?Case discussed with Leanora Ivanoff, DO;Heller*.This is a preliminary note and recommendations are subject to change by attending's addendum or attestation.?Signed:?Chong Sicilian [id number]: [phone]??    Cosigned by: Henry Heinrich, MD at 01/08/2023 ?1:10 PM Revision History

## 2023-01-11 NOTE — Plan of Care
Adult Speech and Language PathologySwallow Progress Note2/14/2024Patient Name:  Henry Duchateau HelgesonMR#:  [id number] Date of Birth:  08-24-1952Therapist:  Roxine Caddy, SLP SLP IP Adult Date of Visit/Evaluation - 01/11/23 1330    Date of Visit/Evaluation  Date of Visit / Treatment 01/11/23   Start Time 1330   End Time 1345      SLP IP Adult General Information - 01/11/23 1330    General Information  Subjective Seen for f/u. Reports cont to tol regular diet/thin liquids w/ baseline sticking in throat/esophageal symptoms. Reports unchanged and baseline.   Pertinent History of Current Problem 72 year old man with PMH COPD, pulmonary fibrosis, PE, chronic back pain s/p cervical spinal fusion, prior food impaction, NASH who presented to ER with coffee-ground emesis. Pt reports he has had dysphagia for years since his cervical spinal surgery. Has food get stuck intermittently. EDG 2/11 z-line slightly irregular, otherwise normal esophagus. Prior EGD 07/2022 showed Food bolus in upper third of esophagus at upper esophageal sphincter with complete obstruction of lumen CXR, XR, and Cherry Log of abdomen was negative.WBCs, temperature stable. Pt on NC 4L.   Prior Level of Functioning Pt with previous hx with speech therapy completing FEES 05/25/2020 given pt reporting of PO getting stuck. FEES reported stated FEES demonstrating mild pharyngo-esophageal dysphagia. Timing and efficiency of laryngeal vestibule closure (LVC) was reduced. This mistiming of LVC is judged to be c/w underlying respiratory illness/COPD. Laryngeal penetration is visualized during serial swallows, without tracheal aspiration (PAS scale score 5/8). No spontaneous 'throat clear' or cough in response to material dipping to level of vocal cords, suggestive of reduced laryngeal sensitivity. FEES recommended continue Regular solids with thin liquids. Pt later had MBS completed 05/26/20 showing mild pharyngeal phase dysphagia and improvement in airway protection PAS 2/8. Pharyngeal wall appearing thickened. There is bony prominence of cervical vertebrae. Esophageal sweep: barium transit timely, barium pill cleared, no retroflow      Dysphagia Treatment - 01/11/23 1330    Dysphagia/Swallow Treatment  Dysphagia Treatment Swallowing   Dysphagia Treatment Comments Pt reports tol regular diet/thin liquids. Feels swallow function baseline. Denies s/s aspiration. Does report some cough when food gets stuck in upper chest.   Dysphagia Follow Up Patient is tolerating current PO regimen without overt signs/symptoms of dysphagia   Dysphagia Follow Up Comments PO trials thin liquids observed. Declined solids- waiting for lunch. Timely swallow initiation. Adequate laryngeal elevation. No s/s aspiration observed.   Dysphagia Recommendations Continue current diet   Dysphagia Recommendations Comments Reviewed aspiration and reflux/esophageal precautions. Encouraged slow rate, chew well, alternate liquids/solids as needed.   Medication Administration with liquid   Dysphagia Plan of Care Re-consult prn   Dysphagia Plan of Care Comments 1) Cont regular diet/thin liquids. 2) PO meds w/ thin liquids, crush PRN. 3) Aspiration, reflux/esophageal precautions. 4) SLP f/u PRN.      SLP IP Adult Recommendations - 01/11/23 1330    Recommendations  SLP Therapy Frequency No follow up necessary   Additional Recommendations SLP reconsult PRN.   Recommendations discussed with: patient      SLP IP Adult Inpatient Recommendations - 01/11/23 1330    SLP Recommendations for Inpatient Admission  Swallow Recommendations 1) Regular diet/thin liquids. 2) PO meds w/ thin liquids. 3) Aspiration, reflux/esophageal precautions. 4) SLP f/u PRN.     Plan of Care Overview/ Patient Status

## 2023-01-12 ENCOUNTER — Encounter: Admit: 2023-01-12 | Payer: PRIVATE HEALTH INSURANCE | Primary: Family Medicine

## 2023-01-12 NOTE — Progress Notes
[facility] Care Navigation:  Transition of Care Note Care Navigation spoke with: PatientDischarging Hospital[facility]: [facility]/2024Hospital Discharge Date: 01/11/2023    Diagnosis: Principal Diagnosis: pneumonia Discharge location: Home AMAHOSPITALIZATION:Reason patient admitted:  PMH COPD, GERD, PE 2019 on Eliquis w h/o wedge resection, giat cell interstitial PNA, chronic back pain on Dilaudid, and cricopharyngeal dysfunction presents to the ED for evaluation of continued nausea and increased headache. Diagnosis at discharge: Principal Diagnosis: pneumonia CURRENT STATE:Since discharge patient reports feeling: Same Patient cared for by: INDEPENDENT  REVIEW OF AFTER VISIT SUMMARY DOCUMENT: MEDICATION CHANGES:Validated NEW medications to take: YesValidated Changed medications to take: N/AValidated Stopped medications to NOT take: YesIssues obtaining prescriptions: No FOLLOW-UP APPOINTMENTS and TRANSPORTATION:Patient aware of scheduled appointments: YesAwareness and assistance with appointments needing to be scheduled: NoTransportation concerns for follow-up appointment: NoDME and HOME HEALTH SERVICES:Durable medical equipment received: N/AContact has been made with home care agency: N/APlan established for follow up labs/tests: Yes ADDITIONAL PATIENT NEEDS:  Additional issues/barriers identified: Please complete course of antibiotics and steroids. Please use oxygen for low oxygen saturation and dyspnea, CPAP at nightTake other medications as prescribed Follow up with your pulmonologist, PCP and pain specialist

## 2023-01-13 LAB — BLOOD CULTURE   (BH GH L LMW YH)
BKR BLOOD CULTURE: NO GROWTH
BKR BLOOD CULTURE: NO GROWTH

## 2023-01-14 LAB — BLOOD CULTURE   (BH GH L LMW YH): BKR BLOOD CULTURE: NO GROWTH

## 2023-01-26 ENCOUNTER — Inpatient Hospital Stay: Admit: 2023-01-26 | Discharge: 2023-01-26 | Payer: PRIVATE HEALTH INSURANCE | Primary: Family Medicine

## 2023-01-26 ENCOUNTER — Ambulatory Visit: Admit: 2023-01-26 | Payer: PRIVATE HEALTH INSURANCE | Attending: Medical Oncology | Primary: Family Medicine

## 2023-01-26 DIAGNOSIS — Z7901 Long term (current) use of anticoagulants: Secondary | ICD-10-CM

## 2023-01-26 DIAGNOSIS — I2782 Chronic pulmonary embolism: Secondary | ICD-10-CM

## 2023-01-26 LAB — COMPREHENSIVE METABOLIC PANEL
BKR A/G RATIO: 0.6
BKR ALANINE AMINOTRANSFERASE (ALT): 25 U/L (ref 12–78)
BKR ALBUMIN: 3.2 g/dL — ABNORMAL LOW (ref 3.4–5.0)
BKR ALKALINE PHOSPHATASE: 169 U/L — ABNORMAL HIGH (ref 20–135)
BKR ANION GAP: 6 g/dL (ref 5–18)
BKR ASPARTATE AMINOTRANSFERASE (AST): 12 U/L (ref 5–37)
BKR AST/ALT RATIO: 0.5
BKR BILIRUBIN TOTAL: 0.4 mg/dL (ref 0.0–1.0)
BKR BLOOD UREA NITROGEN: 9 mg/dL (ref 8–25)
BKR BUN / CREAT RATIO: 11 (ref 8.0–25.0)
BKR CALCIUM: 9.5 mg/dL — ABNORMAL HIGH (ref 8.4–10.3)
BKR CHLORIDE: 109 mmol/L (ref 95–115)
BKR CREATININE: 0.82 mg/dL (ref 0.50–1.30)
BKR EGFR, CREATININE (CKD-EPI 2021): >60 mL/min/{1.73_m2} (ref >=60)
BKR GLOBULIN: 5 g/dL
BKR GLUCOSE: 166 mg/dL — ABNORMAL HIGH (ref 70–100)
BKR OSMOLALITY CALCULATION: 286 mosm/kg (ref 275–295)
BKR POTASSIUM: 3.9 mmol/L (ref 3.5–5.1)
BKR SODIUM: 142 mmol/L (ref 136–145)

## 2023-01-26 LAB — CBC WITH AUTO DIFFERENTIAL
BKR BASOPHILS: 0.2 % (ref 0.0–1.0)
BKR CO2: 28.8 pg (ref 27.0–33.0)
BKR EOSINOPHILS: 0.1 % — ABNORMAL LOW (ref 0.0–5.0)
BKR HEMATOCRIT: 42.6 % (ref 38.50–50.00)
BKR HEMOGLOBIN: 13.6 g/dL (ref 13.2–17.1)
BKR LYMPHOCYTES: 7.5 % — ABNORMAL LOW (ref 17.0–50.0)
BKR MCH: 28.8 pg (ref 27.0–33.0)
BKR MCHC: 31.9 g/dL (ref 31.0–36.0)
BKR MCV: 90.1 fL (ref 80.0–100.0)
BKR MONOCYTES: 2.6 % — ABNORMAL LOW (ref 4.0–12.0)
BKR MPV: 10.3 fL (ref 8.0–12.0)
BKR NEUTROPHILS: 89.6 % — ABNORMAL HIGH (ref 39.0–72.0)
BKR PLATELETS: 304 x 1000/ÂµL (ref 150–420)
BKR PROTEIN TOTAL: 2.6 % — ABNORMAL LOW (ref 4.0–12.0)
BKR RDW-CV: 14.7 % (ref 11.0–15.0)
BKR WAM ANALYZER ANC: 9.85 x 1000/ÂµL — ABNORMAL HIGH (ref 2.00–7.60)
BKR WAM RED BLOOD CELL COUNT.: 4.73 M/??L (ref 4.00–6.00)
BKR WHITE BLOOD CELL COUNT: 11 x1000/??L (ref 4.0–11.0)

## 2023-01-26 LAB — D-DIMER, QUANTITATIVE: BKR D-DIMER: 1.08 mg/L FEU — ABNORMAL HIGH (ref <0.49)
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
## 2023-02-09 ENCOUNTER — Encounter: Admit: 2023-02-09 | Payer: PRIVATE HEALTH INSURANCE | Attending: Urology | Primary: Family Medicine

## 2023-02-09 ENCOUNTER — Ambulatory Visit: Admit: 2023-02-09 | Payer: PRIVATE HEALTH INSURANCE | Attending: Urology | Primary: Family Medicine

## 2023-02-09 DIAGNOSIS — Z8711 Personal history of peptic ulcer disease: Secondary | ICD-10-CM

## 2023-02-09 DIAGNOSIS — E7800 Pure hypercholesterolemia, unspecified: Secondary | ICD-10-CM

## 2023-02-09 DIAGNOSIS — R3989 Other symptoms and signs involving the genitourinary system: Secondary | ICD-10-CM

## 2023-02-09 DIAGNOSIS — G43909 Migraine, unspecified, not intractable, without status migrainosus: Secondary | ICD-10-CM

## 2023-02-09 DIAGNOSIS — N23 Unspecified renal colic: Secondary | ICD-10-CM

## 2023-02-09 DIAGNOSIS — N400 Enlarged prostate without lower urinary tract symptoms: Secondary | ICD-10-CM

## 2023-02-09 DIAGNOSIS — M720 Palmar fascial fibromatosis [Dupuytren]: Secondary | ICD-10-CM

## 2023-02-09 DIAGNOSIS — Z9989 Dependence on other enabling machines and devices: Secondary | ICD-10-CM

## 2023-02-09 DIAGNOSIS — N390 Urinary tract infection, site not specified: Secondary | ICD-10-CM

## 2023-02-09 DIAGNOSIS — J449 Chronic obstructive pulmonary disease, unspecified: Secondary | ICD-10-CM

## 2023-02-09 DIAGNOSIS — S060XAA Concussion: Secondary | ICD-10-CM

## 2023-02-09 DIAGNOSIS — I10 Essential (primary) hypertension: Secondary | ICD-10-CM

## 2023-02-09 DIAGNOSIS — A419 Sepsis, unspecified organism: Secondary | ICD-10-CM

## 2023-02-09 DIAGNOSIS — M1990 Unspecified osteoarthritis, unspecified site: Secondary | ICD-10-CM

## 2023-02-09 DIAGNOSIS — K760 Fatty (change of) liver, not elsewhere classified: Secondary | ICD-10-CM

## 2023-02-09 DIAGNOSIS — Z5189 Encounter for other specified aftercare: Secondary | ICD-10-CM

## 2023-02-09 DIAGNOSIS — R092 Respiratory arrest: Secondary | ICD-10-CM

## 2023-02-09 DIAGNOSIS — R112 Nausea with vomiting, unspecified: Secondary | ICD-10-CM

## 2023-02-09 DIAGNOSIS — R319 Hematuria, unspecified: Secondary | ICD-10-CM

## 2023-02-09 DIAGNOSIS — N319 Neuromuscular dysfunction of bladder, unspecified: Secondary | ICD-10-CM

## 2023-02-09 DIAGNOSIS — C4491 Basal cell carcinoma of skin, unspecified: Secondary | ICD-10-CM

## 2023-02-09 DIAGNOSIS — R339 Retention of urine, unspecified: Secondary | ICD-10-CM

## 2023-02-09 DIAGNOSIS — K219 Gastro-esophageal reflux disease without esophagitis: Secondary | ICD-10-CM

## 2023-02-09 MED ORDER — SILDENAFIL 100 MG TABLET
100 mg | ORAL_TABLET | Freq: Every day | ORAL | 4 refills | Status: AC | PRN
Start: 2023-02-09 — End: 2023-02-10

## 2023-02-10 ENCOUNTER — Telehealth: Admit: 2023-02-10 | Payer: PRIVATE HEALTH INSURANCE | Attending: Urology | Primary: Family Medicine

## 2023-02-10 LAB — URINE CULTURE: BKR URINE CULTURE, ROUTINE: NO GROWTH fL (ref 8.0–12.0)

## 2023-02-10 MED ORDER — SILDENAFIL 100 MG TABLET
100 mg | ORAL_TABLET | Freq: Every day | ORAL | 4 refills | Status: AC | PRN
Start: 2023-02-10 — End: ?

## 2023-02-10 NOTE — Telephone Encounter
Sent, thx  

## 2023-02-10 NOTE — Telephone Encounter
Spoke with patient to inform of normal urine culture. Patient verbalized understanding.During call, patient mentioned sildenafil was not covered by his insurance and he cannot afford to pay out of pocket for it. Informed patient to go on GoodRx.com to look for coupons. Patient states he cannot use computer on his own. Writer instructed patient to come to office at [address] [facility] to pick up GoodRx coupon. Per [employer], sildenafil is $17 at [employer] Medication pended to MD to send to [facility] in [place]. Patient verbalized understanding and thanked for the call.

## 2023-02-13 ENCOUNTER — Encounter: Admit: 2023-02-13 | Payer: PRIVATE HEALTH INSURANCE | Attending: Urology | Primary: Family Medicine

## 2023-02-23 ENCOUNTER — Telehealth: Admit: 2023-02-23 | Payer: PRIVATE HEALTH INSURANCE | Attending: Urology | Primary: Family Medicine

## 2023-02-23 NOTE — Telephone Encounter
I called and spoke with [name] to confirm how many times he performs CIC daily. Pt stated that he is performing CIC 6-7 times daily. Pt will need to perform CIC indefinitely. Pt thanked me for calling to check in on him.

## 2023-07-27 ENCOUNTER — Ambulatory Visit: Admit: 2023-07-27 | Payer: PRIVATE HEALTH INSURANCE | Attending: Medical Oncology | Primary: Family Medicine

## 2023-07-27 ENCOUNTER — Encounter: Admit: 2023-07-27 | Payer: PRIVATE HEALTH INSURANCE | Attending: Medical Oncology | Primary: Family Medicine

## 2023-07-27 ENCOUNTER — Inpatient Hospital Stay: Admit: 2023-07-27 | Discharge: 2023-07-27 | Payer: PRIVATE HEALTH INSURANCE | Primary: Family Medicine

## 2023-07-27 DIAGNOSIS — D649 Anemia, unspecified: Secondary | ICD-10-CM

## 2023-07-27 DIAGNOSIS — R092 Respiratory arrest: Secondary | ICD-10-CM

## 2023-07-27 DIAGNOSIS — K219 Gastro-esophageal reflux disease without esophagitis: Secondary | ICD-10-CM

## 2023-07-27 DIAGNOSIS — Z7901 Long term (current) use of anticoagulants: Secondary | ICD-10-CM

## 2023-07-27 DIAGNOSIS — A419 Sepsis, unspecified organism: Secondary | ICD-10-CM

## 2023-07-27 DIAGNOSIS — N400 Enlarged prostate without lower urinary tract symptoms: Secondary | ICD-10-CM

## 2023-07-27 DIAGNOSIS — N319 Neuromuscular dysfunction of bladder, unspecified: Secondary | ICD-10-CM

## 2023-07-27 DIAGNOSIS — J449 Chronic obstructive pulmonary disease, unspecified: Secondary | ICD-10-CM

## 2023-07-27 DIAGNOSIS — R112 Nausea with vomiting, unspecified: Secondary | ICD-10-CM

## 2023-07-27 DIAGNOSIS — E7800 Pure hypercholesterolemia, unspecified: Secondary | ICD-10-CM

## 2023-07-27 DIAGNOSIS — Z5189 Encounter for other specified aftercare: Secondary | ICD-10-CM

## 2023-07-27 DIAGNOSIS — R3989 Other symptoms and signs involving the genitourinary system: Secondary | ICD-10-CM

## 2023-07-27 DIAGNOSIS — N23 Unspecified renal colic: Secondary | ICD-10-CM

## 2023-07-27 DIAGNOSIS — G43909 Migraine, unspecified, not intractable, without status migrainosus: Secondary | ICD-10-CM

## 2023-07-27 DIAGNOSIS — I8291 Chronic embolism and thrombosis of unspecified vein: Secondary | ICD-10-CM

## 2023-07-27 DIAGNOSIS — Z8711 Personal history of peptic ulcer disease: Secondary | ICD-10-CM

## 2023-07-27 DIAGNOSIS — Z9989 Dependence on other enabling machines and devices: Secondary | ICD-10-CM

## 2023-07-27 DIAGNOSIS — C4491 Basal cell carcinoma of skin, unspecified: Secondary | ICD-10-CM

## 2023-07-27 DIAGNOSIS — N390 Urinary tract infection, site not specified: Secondary | ICD-10-CM

## 2023-07-27 DIAGNOSIS — I2782 Chronic pulmonary embolism: Secondary | ICD-10-CM

## 2023-07-27 DIAGNOSIS — I10 Essential (primary) hypertension: Secondary | ICD-10-CM

## 2023-07-27 DIAGNOSIS — S060XAA Concussion: Secondary | ICD-10-CM

## 2023-07-27 DIAGNOSIS — R319 Hematuria, unspecified: Secondary | ICD-10-CM

## 2023-07-27 DIAGNOSIS — M720 Palmar fascial fibromatosis [Dupuytren]: Secondary | ICD-10-CM

## 2023-07-27 DIAGNOSIS — M1990 Unspecified osteoarthritis, unspecified site: Secondary | ICD-10-CM

## 2023-07-27 DIAGNOSIS — K760 Fatty (change of) liver, not elsewhere classified: Secondary | ICD-10-CM

## 2023-07-27 LAB — COMPREHENSIVE METABOLIC PANEL
BKR A/G RATIO: 0.8
BKR ALANINE AMINOTRANSFERASE (ALT): 15 U/L (ref 12–78)
BKR ALBUMIN: 3.3 g/dL — ABNORMAL LOW (ref 3.4–5.0)
BKR ALKALINE PHOSPHATASE: 160 U/L — ABNORMAL HIGH (ref 20–135)
BKR ANION GAP: 6 (ref 5–18)
BKR ASPARTATE AMINOTRANSFERASE (AST): 16 U/L (ref 5–37)
BKR AST/ALT RATIO: 1.1
BKR BILIRUBIN TOTAL: 0.4 mg/dL (ref 0.0–1.0)
BKR BLOOD UREA NITROGEN: 10 mg/dL (ref 8–25)
BKR BUN / CREAT RATIO: 14.3 (ref 8.0–25.0)
BKR CALCIUM: 9.6 mg/dL (ref 8.4–10.3)
BKR CHLORIDE: 108 mmol/L (ref 95–115)
BKR CO2: 24 mmol/L (ref 21–32)
BKR CREATININE: 0.7 mg/dL (ref 0.50–1.30)
BKR EGFR, CREATININE (CKD-EPI 2021): >60 mL/min/{1.73_m2} (ref >=60)
BKR GLOBULIN: 4.3 g/dL
BKR GLUCOSE: 88 mg/dL (ref 70–100)
BKR OSMOLALITY CALCULATION: 274 mosm/kg — ABNORMAL LOW (ref 275–295)
BKR POTASSIUM: 4.1 mmol/L (ref 3.5–5.1)
BKR PROTEIN TOTAL: 7.6 g/dL (ref 6.4–8.2)
BKR SODIUM: 138 mmol/L (ref 136–145)

## 2023-07-27 LAB — CBC WITH AUTO DIFFERENTIAL
BKR WAM ABSOLUTE IMMATURE GRANULOCYTES.: 0.02 x 1000/ÂµL — ABNORMAL LOW (ref 0.00–0.30)
BKR WAM ABSOLUTE LYMPHOCYTE COUNT.: 1.49 x 1000/ÂµL (ref 0.60–3.70)
BKR WAM ABSOLUTE NRBC (2 DEC): 0 x 1000/ÂµL (ref 0.00–1.00)
BKR WAM ANC (ABSOLUTE NEUTROPHIL COUNT): 6.15 x 1000/ÂµL (ref 2.00–7.60)
BKR WAM BASOPHIL ABSOLUTE COUNT.: 0.03 x 1000/ÂµL (ref 0.00–1.00)
BKR WAM BASOPHILS: 0.4 % (ref 0.0–1.4)
BKR WAM EOSINOPHIL ABSOLUTE COUNT.: 0.34 x 1000/ÂµL (ref 0.00–1.00)
BKR WAM EOSINOPHILS: 4 % — ABNORMAL LOW (ref 0.0–5.0)
BKR WAM HEMATOCRIT (2 DEC): 40.2 % (ref 38.50–50.00)
BKR WAM HEMOGLOBIN: 13 g/dL — ABNORMAL LOW (ref 13.2–17.1)
BKR WAM IMMATURE GRANULOCYTES: 0.2 % — ABNORMAL HIGH (ref 0.0–1.0)
BKR WAM LYMPHOCYTES: 17.6 % (ref 17.0–50.0)
BKR WAM MCH (PG): 28.4 pg (ref 27.0–33.0)
BKR WAM MCHC: 32.3 g/dL (ref 31.0–36.0)
BKR WAM MCV: 87.8 fL (ref 80.0–100.0)
BKR WAM MONOCYTE ABSOLUTE COUNT.: 0.43 x 1000/ÂµL (ref 0.00–1.00)
BKR WAM MONOCYTES: 5.1 % (ref 4.0–12.0)
BKR WAM MPV: 10.4 fL (ref 8.0–12.0)
BKR WAM NEUTROPHILS: 72.7 % — ABNORMAL HIGH (ref 39.0–72.0)
BKR WAM NUCLEATED RED BLOOD CELLS: 0 % (ref 0.0–1.0)
BKR WAM PLATELETS: 239 x1000/ÂµL (ref 150–420)
BKR WAM RDW-CV: 14.9 % (ref 11.0–15.0)
BKR WAM RED BLOOD CELL COUNT.: 4.58 M/ÂµL (ref 4.00–6.00)
BKR WAM WHITE BLOOD CELL COUNT: 8.5 x1000/ÂµL (ref 4.0–11.0)

## 2023-07-27 MED ORDER — APIXABAN 5 MG TABLET
5 | ORAL_TABLET | Freq: Two times a day (BID) | ORAL | 4 refills | Status: AC
Start: 2023-07-27 — End: ?
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
## 2023-10-19 ENCOUNTER — Encounter: Admit: 2023-10-19 | Payer: PRIVATE HEALTH INSURANCE | Attending: Urology | Primary: Family Medicine

## 2024-01-25 ENCOUNTER — Inpatient Hospital Stay: Admit: 2024-01-25 | Discharge: 2024-01-25 | Payer: PRIVATE HEALTH INSURANCE | Primary: Family Medicine

## 2024-01-25 ENCOUNTER — Ambulatory Visit: Admit: 2024-01-25 | Payer: PRIVATE HEALTH INSURANCE | Attending: Medical Oncology | Primary: Family Medicine

## 2024-01-25 ENCOUNTER — Encounter: Admit: 2024-01-25 | Payer: PRIVATE HEALTH INSURANCE | Attending: Medical Oncology | Primary: Family Medicine

## 2024-01-25 VITALS — BP 115/74 | HR 100 | Temp 97.60000°F | Resp 18 | Ht 72.0 in | Wt 204.0 lb

## 2024-01-25 DIAGNOSIS — Z8711 Personal history of peptic ulcer disease: Secondary | ICD-10-CM

## 2024-01-25 DIAGNOSIS — A419 Sepsis, unspecified organism: Secondary | ICD-10-CM

## 2024-01-25 DIAGNOSIS — M1990 Unspecified osteoarthritis, unspecified site: Secondary | ICD-10-CM

## 2024-01-25 DIAGNOSIS — R319 Hematuria, unspecified: Secondary | ICD-10-CM

## 2024-01-25 DIAGNOSIS — I8291 Chronic embolism and thrombosis of unspecified vein: Secondary | ICD-10-CM

## 2024-01-25 DIAGNOSIS — I2782 Chronic pulmonary embolism: Secondary | ICD-10-CM

## 2024-01-25 DIAGNOSIS — J449 Chronic obstructive pulmonary disease, unspecified: Secondary | ICD-10-CM

## 2024-01-25 DIAGNOSIS — Z7901 Long term (current) use of anticoagulants: Secondary | ICD-10-CM

## 2024-01-25 DIAGNOSIS — Z9989 Dependence on other enabling machines and devices: Secondary | ICD-10-CM

## 2024-01-25 DIAGNOSIS — N400 Enlarged prostate without lower urinary tract symptoms: Secondary | ICD-10-CM

## 2024-01-25 DIAGNOSIS — K760 Fatty (change of) liver, not elsewhere classified: Secondary | ICD-10-CM

## 2024-01-25 DIAGNOSIS — R112 Nausea with vomiting, unspecified: Secondary | ICD-10-CM

## 2024-01-25 DIAGNOSIS — N319 Neuromuscular dysfunction of bladder, unspecified: Secondary | ICD-10-CM

## 2024-01-25 DIAGNOSIS — Z5189 Encounter for other specified aftercare: Secondary | ICD-10-CM

## 2024-01-25 DIAGNOSIS — N23 Unspecified renal colic: Secondary | ICD-10-CM

## 2024-01-25 DIAGNOSIS — D649 Anemia, unspecified: Secondary | ICD-10-CM

## 2024-01-25 DIAGNOSIS — G43909 Migraine, unspecified, not intractable, without status migrainosus: Secondary | ICD-10-CM

## 2024-01-25 DIAGNOSIS — S060XAA Concussion: Secondary | ICD-10-CM

## 2024-01-25 DIAGNOSIS — M720 Palmar fascial fibromatosis [Dupuytren]: Secondary | ICD-10-CM

## 2024-01-25 DIAGNOSIS — C4491 Basal cell carcinoma of skin, unspecified: Secondary | ICD-10-CM

## 2024-01-25 DIAGNOSIS — E7800 Pure hypercholesterolemia, unspecified: Secondary | ICD-10-CM

## 2024-01-25 DIAGNOSIS — I10 Essential (primary) hypertension: Secondary | ICD-10-CM

## 2024-01-25 DIAGNOSIS — K219 Gastro-esophageal reflux disease without esophagitis: Secondary | ICD-10-CM

## 2024-01-25 DIAGNOSIS — N390 Urinary tract infection, site not specified: Secondary | ICD-10-CM

## 2024-01-25 DIAGNOSIS — R3989 Other symptoms and signs involving the genitourinary system: Secondary | ICD-10-CM

## 2024-01-25 DIAGNOSIS — R092 Respiratory arrest: Secondary | ICD-10-CM

## 2024-01-25 LAB — COMPREHENSIVE METABOLIC PANEL
BKR A/G RATIO: 0.9
BKR ALANINE AMINOTRANSFERASE (ALT): 18 U/L (ref 12–78)
BKR ALBUMIN: 3.5 g/dL (ref 3.4–5.0)
BKR ALKALINE PHOSPHATASE: 168 U/L — ABNORMAL HIGH (ref 20–135)
BKR ANION GAP: 9 (ref 5–18)
BKR ASPARTATE AMINOTRANSFERASE (AST): 14 U/L (ref 5–37)
BKR AST/ALT RATIO: 0.8
BKR BILIRUBIN TOTAL: 0.7 mg/dL (ref 0.0–1.0)
BKR BLOOD UREA NITROGEN: 14 mg/dL (ref 8–25)
BKR BUN / CREAT RATIO: 16.1 (ref 8.0–25.0)
BKR CALCIUM: 9.2 mg/dL (ref 8.4–10.3)
BKR CHLORIDE: 107 mmol/L (ref 95–115)
BKR CO2: 24 mmol/L (ref 21–32)
BKR CREATININE DELTA: 0.17
BKR CREATININE: 0.87 mg/dL (ref 0.50–1.30)
BKR EGFR, CREATININE (CKD-EPI 2021): >60 mL/min/{1.73_m2} (ref >=60)
BKR GLOBULIN: 4.1 g/dL
BKR GLUCOSE: 84 mg/dL (ref 70–100)
BKR OSMOLALITY CALCULATION: 279 mosm/kg (ref 275–295)
BKR POTASSIUM: 4.2 mmol/L (ref 3.5–5.1)
BKR PROTEIN TOTAL: 7.6 g/dL (ref 6.4–8.2)
BKR SODIUM: 140 mmol/L (ref 136–145)

## 2024-01-25 LAB — CBC WITH AUTO DIFFERENTIAL
BKR WAM ABSOLUTE IMMATURE GRANULOCYTES.: 0.06 x 1000/ÂµL (ref 0.00–0.30)
BKR WAM ABSOLUTE LYMPHOCYTE COUNT.: 1.4 x 1000/ÂµL (ref 0.60–3.70)
BKR WAM ABSOLUTE NRBC (2 DEC): 0 x 1000/ÂµL (ref 0.00–1.00)
BKR WAM ANC (ABSOLUTE NEUTROPHIL COUNT): 9.93 x 1000/ÂµL — ABNORMAL HIGH (ref 2.00–7.60)
BKR WAM BASOPHIL ABSOLUTE COUNT.: 0.05 x 1000/ÂµL (ref 0.00–1.00)
BKR WAM BASOPHILS: 0.4 % (ref 0.0–1.4)
BKR WAM EOSINOPHIL ABSOLUTE COUNT.: 0.03 x 1000/ÂµL (ref 0.00–1.00)
BKR WAM EOSINOPHILS: 0.2 % (ref 0.0–5.0)
BKR WAM HEMATOCRIT (2 DEC): 43.8 % (ref 38.50–50.00)
BKR WAM HEMOGLOBIN: 13.9 g/dL (ref 13.2–17.1)
BKR WAM IMMATURE GRANULOCYTES: 0.5 % (ref 0.0–1.0)
BKR WAM LYMPHOCYTES: 11.1 % — ABNORMAL LOW (ref 17.0–50.0)
BKR WAM MCH (PG): 27.4 pg (ref 27.0–33.0)
BKR WAM MCHC: 31.7 g/dL (ref 31.0–36.0)
BKR WAM MCV: 86.2 fL (ref 80.0–100.0)
BKR WAM MONOCYTE ABSOLUTE COUNT.: 1.09 x 1000/ÂµL — ABNORMAL HIGH (ref 0.00–1.00)
BKR WAM MONOCYTES: 8.7 % (ref 4.0–12.0)
BKR WAM MPV: 10.4 fL (ref 8.0–12.0)
BKR WAM NEUTROPHILS: 79.1 % — ABNORMAL HIGH (ref 39.0–72.0)
BKR WAM NUCLEATED RED BLOOD CELLS: 0 % (ref 0.0–1.0)
BKR WAM PLATELETS: 209 x1000/ÂµL (ref 150–420)
BKR WAM RDW-CV: 15.5 % — ABNORMAL HIGH (ref 11.0–15.0)
BKR WAM RED BLOOD CELL COUNT.: 5.08 M/ÂµL (ref 4.00–6.00)
BKR WAM WHITE BLOOD CELL COUNT: 12.6 x1000/ÂµL — ABNORMAL HIGH (ref 4.0–11.0)

## 2024-01-25 LAB — RETICULOCYTES
BKR WAM IRF: 8.2 % (ref 3.0–15.9)
BKR WAM RETICULOCYTE - ABS (3 DEC): 0.034 10Ë6 cells/uL (ref 0.023–0.140)
BKR WAM RETICULOCYTE COUNT PCT (1 DEC): 0.7 % (ref 0.6–2.7)
BKR WAM RETICULOCYTE HGB EQUIVALENT: 30.4 pg (ref 28.2–35.7)

## 2024-01-25 LAB — IRON AND TIBC
BKR IRON SATURATION (SCCCT BH GH LM): 4 % — ABNORMAL LOW (ref 11–46)
BKR IRON: 13 ug/dL — ABNORMAL LOW (ref 50–175)
BKR TOTAL IRON BINDING CAPACITY (SCCCT  BH GH LM): 347 ug/dL (ref 250–450)

## 2024-01-25 LAB — FERRITIN: BKR FERRITIN: 102 ng/mL (ref 26–388)

## 2024-01-25 MED ORDER — APIXABAN 5 MG TABLET
5 | ORAL_TABLET | Freq: Two times a day (BID) | ORAL | 4 refills | Status: AC
Start: 2024-01-25 — End: ?

## 2024-01-25 MED ORDER — HYDROCODONE 10 MG-ACETAMINOPHEN 325 MG TABLET
10-325 | Freq: Four times a day (QID) | ORAL | Status: AC
Start: 2024-01-25 — End: ?
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
## 2024-02-05 ENCOUNTER — Telehealth: Admit: 2024-02-05 | Payer: PRIVATE HEALTH INSURANCE | Primary: Family Medicine

## 2024-02-05 NOTE — Telephone Encounter
 Spoke to pt, relayed message below. Verbalized understanding stated will take iron along with vitamin-C 3 times per week until he sees MD in 6 months.  States that he no longer has fevers, he had a UTI that was treated with antibiotics and now no issues.  MD aware.----- Message from Rolm Gala, MD sent at 02/02/2024  4:31 PM EST -----Please tell him that the blood test indicates that he is iron deficiencyAdvise him to take iron and vitamin-C 3 times a week until I see him next in 6 monthsPlease find out whether he has had recurrent fevers

## 2024-07-25 ENCOUNTER — Ambulatory Visit: Admit: 2024-07-25 | Payer: PRIVATE HEALTH INSURANCE | Attending: Medical Oncology | Primary: Family Medicine

## 2024-07-25 ENCOUNTER — Encounter: Admit: 2024-07-25 | Payer: PRIVATE HEALTH INSURANCE | Attending: Medical Oncology | Primary: Family Medicine

## 2024-07-25 ENCOUNTER — Telehealth: Admit: 2024-07-25 | Payer: PRIVATE HEALTH INSURANCE | Attending: Medical Oncology | Primary: Family Medicine

## 2024-07-25 ENCOUNTER — Inpatient Hospital Stay: Admit: 2024-07-25 | Discharge: 2024-07-25 | Payer: PRIVATE HEALTH INSURANCE | Primary: Family Medicine

## 2024-07-25 VITALS — BP 129/81 | HR 82 | Temp 97.90000°F | Resp 18 | Ht 72.0 in | Wt 208.0 lb

## 2024-07-25 DIAGNOSIS — R319 Hematuria, unspecified: Secondary | ICD-10-CM

## 2024-07-25 DIAGNOSIS — Z9989 Dependence on other enabling machines and devices: Secondary | ICD-10-CM

## 2024-07-25 DIAGNOSIS — R112 Nausea with vomiting, unspecified: Secondary | ICD-10-CM

## 2024-07-25 DIAGNOSIS — S060XAA Concussion: Secondary | ICD-10-CM

## 2024-07-25 DIAGNOSIS — J449 Chronic obstructive pulmonary disease, unspecified: Secondary | ICD-10-CM

## 2024-07-25 DIAGNOSIS — Z5189 Encounter for other specified aftercare: Secondary | ICD-10-CM

## 2024-07-25 DIAGNOSIS — D649 Anemia, unspecified: Secondary | ICD-10-CM

## 2024-07-25 DIAGNOSIS — I8291 Chronic embolism and thrombosis of unspecified vein: Secondary | ICD-10-CM

## 2024-07-25 DIAGNOSIS — Z8711 Personal history of peptic ulcer disease: Secondary | ICD-10-CM

## 2024-07-25 DIAGNOSIS — C4491 Basal cell carcinoma of skin, unspecified: Principal | ICD-10-CM

## 2024-07-25 DIAGNOSIS — A419 Sepsis, unspecified organism: Secondary | ICD-10-CM

## 2024-07-25 DIAGNOSIS — I10 Essential (primary) hypertension: Secondary | ICD-10-CM

## 2024-07-25 DIAGNOSIS — E7800 Pure hypercholesterolemia, unspecified: Secondary | ICD-10-CM

## 2024-07-25 DIAGNOSIS — Z7901 Long term (current) use of anticoagulants: Principal | ICD-10-CM

## 2024-07-25 DIAGNOSIS — Z885 Allergy status to narcotic agent status: Secondary | ICD-10-CM

## 2024-07-25 DIAGNOSIS — F17210 Nicotine dependence, cigarettes, uncomplicated: Secondary | ICD-10-CM

## 2024-07-25 DIAGNOSIS — M720 Palmar fascial fibromatosis [Dupuytren]: Secondary | ICD-10-CM

## 2024-07-25 DIAGNOSIS — Z888 Allergy status to other drugs, medicaments and biological substances status: Secondary | ICD-10-CM

## 2024-07-25 DIAGNOSIS — G43909 Migraine, unspecified, not intractable, without status migrainosus: Secondary | ICD-10-CM

## 2024-07-25 DIAGNOSIS — K760 Fatty (change of) liver, not elsewhere classified: Secondary | ICD-10-CM

## 2024-07-25 DIAGNOSIS — R3989 Other symptoms and signs involving the genitourinary system: Secondary | ICD-10-CM

## 2024-07-25 DIAGNOSIS — R092 Respiratory arrest: Secondary | ICD-10-CM

## 2024-07-25 DIAGNOSIS — K219 Gastro-esophageal reflux disease without esophagitis: Secondary | ICD-10-CM

## 2024-07-25 DIAGNOSIS — M1990 Unspecified osteoarthritis, unspecified site: Secondary | ICD-10-CM

## 2024-07-25 DIAGNOSIS — N319 Neuromuscular dysfunction of bladder, unspecified: Secondary | ICD-10-CM

## 2024-07-25 DIAGNOSIS — N390 Urinary tract infection, site not specified: Secondary | ICD-10-CM

## 2024-07-25 DIAGNOSIS — N23 Unspecified renal colic: Secondary | ICD-10-CM

## 2024-07-25 DIAGNOSIS — N400 Enlarged prostate without lower urinary tract symptoms: Secondary | ICD-10-CM

## 2024-07-25 LAB — COMPREHENSIVE METABOLIC PANEL
BKR A/G RATIO: 1
BKR ALANINE AMINOTRANSFERASE (ALT): 20 U/L (ref 12–78)
BKR ALBUMIN: 3.6 g/dL (ref 3.4–5.0)
BKR ALKALINE PHOSPHATASE: 162 U/L — ABNORMAL HIGH (ref 20–135)
BKR ANION GAP: 8 (ref 3–18)
BKR ASPARTATE AMINOTRANSFERASE (AST): 17 U/L (ref 5–37)
BKR AST/ALT RATIO: 0.9
BKR BILIRUBIN TOTAL: 0.4 mg/dL (ref 0.0–1.0)
BKR BLOOD UREA NITROGEN: 11 mg/dL (ref 8–25)
BKR BUN / CREAT RATIO: 14.7 (ref 8.0–25.0)
BKR CALCIUM: 9 mg/dL (ref 8.4–10.3)
BKR CHLORIDE: 111 mmol/L (ref 95–115)
BKR CO2: 26 mmol/L (ref 21–32)
BKR CREATININE DELTA: -0.12
BKR CREATININE: 0.75 mg/dL (ref 0.50–1.30)
BKR EGFR, CREATININE (CKD-EPI 2021): >60 mL/min/1.73m2 (ref >=60)
BKR GLOBULIN: 3.5 g/dL
BKR GLUCOSE: 83 mg/dL (ref 70–100)
BKR OSMOLALITY CALCULATION: 287 mosm/kg (ref 275–295)
BKR POTASSIUM: 4.1 mmol/L (ref 3.5–5.1)
BKR PROTEIN TOTAL: 7.1 g/dL (ref 6.4–8.2)
BKR SODIUM: 145 mmol/L (ref 136–145)

## 2024-07-25 LAB — CBC WITHOUT DIFFERENTIAL
BKR WAM ANC (ABSOLUTE NEUTROPHIL COUNT): 4.91 x 1000/ÂµL (ref 2.00–7.60)
BKR WAM HEMATOCRIT: 42.9 % (ref 38.50–50.00)
BKR WAM HEMOGLOBIN: 13.7 g/dL (ref 13.2–17.1)
BKR WAM MCH: 28.1 pg (ref 27.0–33.0)
BKR WAM MCHC: 31.9 g/dL (ref 31.0–36.0)
BKR WAM MCV: 88.1 fL (ref 80.0–100.0)
BKR WAM MPV: 10 fL (ref 8.0–12.0)
BKR WAM PLATELETS: 214 x1000/ÂµL (ref 150–420)
BKR WAM RDW-CV: 14.4 % (ref 11.0–15.0)
BKR WAM RED BLOOD CELL COUNT.: 4.87 M/ÂµL (ref 4.00–6.00)
BKR WAM WHITE BLOOD CELL COUNT: 7.5 x1000/ÂµL (ref 4.0–11.0)

## 2024-07-25 LAB — FERRITIN: BKR FERRITIN: 33 ng/mL (ref 26–388)

## 2024-07-25 LAB — IRON AND TIBC
BKR IRON SATURATION (SCCCT BH GH LM): 9 % — ABNORMAL LOW (ref 11–46)
BKR IRON: 31 ug/dL — ABNORMAL LOW (ref 50–175)
BKR TOTAL IRON BINDING CAPACITY (SCCCT  BH GH LM): 343 ug/dL (ref 250–450)

## 2024-07-25 NOTE — Telephone Encounter
 Spoke with pt, he is taking iron along with vitamin C three times a week.  Dr. Jama aware and pt to increase this to 5 days a week.----- Message -----From: Jama Anthon Grooms, MDSent: 07/25/2024   2:20 PM EDTTo: Ancil Engels Practice NursesPlease tell him that he is again iron deficient.  Please verify that he is taking iron and vitamin-C at least 3 days a week

## 2024-07-25 NOTE — Telephone Encounter
-----   Message from Anthon Ruth, MD sent at 07/25/2024  2:19 PM EDT -----Please tell him that he is again iron deficient.  Please verify that he is taking iron and vitamin-C at least 3 days a week
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
## 2024-12-15 ENCOUNTER — Encounter: Admit: 2024-12-15 | Payer: PRIVATE HEALTH INSURANCE | Attending: Medical Oncology | Primary: Family Medicine

## 2024-12-15 DIAGNOSIS — I8291 Chronic embolism and thrombosis of unspecified vein: Secondary | ICD-10-CM

## 2024-12-15 DIAGNOSIS — Z7901 Long term (current) use of anticoagulants: Principal | ICD-10-CM

## 2024-12-15 DIAGNOSIS — I2782 Chronic pulmonary embolism: Secondary | ICD-10-CM

## 2024-12-16 MED ORDER — ELIQUIS 5 MG TABLET
5 | ORAL_TABLET | Freq: Two times a day (BID) | ORAL | 4 refills | Status: AC
Start: 2024-12-16 — End: ?

## 2025-01-24 ENCOUNTER — Ambulatory Visit: Admit: 2025-01-24 | Payer: PRIVATE HEALTH INSURANCE | Primary: Family Medicine

## 2025-01-24 ENCOUNTER — Ambulatory Visit: Admit: 2025-01-24 | Payer: PRIVATE HEALTH INSURANCE | Attending: Medical Oncology | Primary: Family Medicine
